# Patient Record
Sex: Female | Born: 1950 | Race: White | Hispanic: No | Marital: Married | Smoking: Never smoker
Health system: Southern US, Community
[De-identification: ages and names within clinical notes are randomized; demographics above are authoritative.]

## PROBLEM LIST (undated history)

## (undated) DIAGNOSIS — E785 Hyperlipidemia, unspecified: Secondary | ICD-10-CM

## (undated) DIAGNOSIS — K2270 Barrett's esophagus without dysplasia: Secondary | ICD-10-CM

## (undated) DIAGNOSIS — C4359 Malignant melanoma of other part of trunk: Secondary | ICD-10-CM

## (undated) DIAGNOSIS — K219 Gastro-esophageal reflux disease without esophagitis: Secondary | ICD-10-CM

## (undated) DIAGNOSIS — N078 Hereditary nephropathy, not elsewhere classified with other morphologic lesions: Secondary | ICD-10-CM

## (undated) DIAGNOSIS — Z86010 Personal history of colonic polyps: Secondary | ICD-10-CM

## (undated) DIAGNOSIS — I4510 Unspecified right bundle-branch block: Secondary | ICD-10-CM

## (undated) DIAGNOSIS — D049 Carcinoma in situ of skin, unspecified: Secondary | ICD-10-CM

## (undated) DIAGNOSIS — Z85820 Personal history of malignant melanoma of skin: Secondary | ICD-10-CM

## (undated) DIAGNOSIS — I10 Essential (primary) hypertension: Secondary | ICD-10-CM

## (undated) DIAGNOSIS — C44519 Basal cell carcinoma of skin of other part of trunk: Secondary | ICD-10-CM

## (undated) DIAGNOSIS — G43909 Migraine, unspecified, not intractable, without status migrainosus: Secondary | ICD-10-CM

## (undated) HISTORY — DX: Hyperlipidemia, unspecified: E78.5

## (undated) HISTORY — DX: Carcinoma in situ of skin, unspecified: D04.9

## (undated) HISTORY — DX: Malignant melanoma of other part of trunk: C43.59

## (undated) HISTORY — DX: Unspecified right bundle-branch block: I45.10

## (undated) HISTORY — DX: Basal cell carcinoma of skin of other part of trunk: C44.519

## (undated) HISTORY — DX: Essential (primary) hypertension: I10

## (undated) HISTORY — DX: Gastro-esophageal reflux disease without esophagitis: K21.9

## (undated) HISTORY — DX: Hereditary nephropathy, not elsewhere classified with other morphologic lesions: N07.8

## (undated) HISTORY — DX: Personal history of colonic polyps: Z86.010

## (undated) HISTORY — DX: Migraine, unspecified, not intractable, without status migrainosus: G43.909

## (undated) HISTORY — DX: Personal history of malignant melanoma of skin: Z85.820

## (undated) HISTORY — DX: Barrett's esophagus without dysplasia: K22.70

---

## 1978-11-02 HISTORY — PX: OTHER SURGICAL HISTORY: SHX169

## 1991-11-03 HISTORY — PX: ABDOMINAL HYSTERECTOMY: SHX81

## 2008-11-02 DIAGNOSIS — K2270 Barrett's esophagus without dysplasia: Secondary | ICD-10-CM

## 2008-11-02 HISTORY — PX: CHOLECYSTECTOMY: SHX55

## 2008-11-02 HISTORY — DX: Barrett's esophagus without dysplasia: K22.70

## 2008-11-14 ENCOUNTER — Encounter: Payer: Self-pay | Admitting: Gastroenterology

## 2008-12-03 DIAGNOSIS — Z86010 Personal history of colonic polyps: Secondary | ICD-10-CM | POA: Insufficient documentation

## 2008-12-03 DIAGNOSIS — Z860101 Personal history of adenomatous and serrated colon polyps: Secondary | ICD-10-CM

## 2008-12-03 HISTORY — PX: COLONOSCOPY W/ BIOPSIES AND POLYPECTOMY: SHX1376

## 2008-12-03 HISTORY — DX: Personal history of adenomatous and serrated colon polyps: Z86.0101

## 2008-12-03 HISTORY — DX: Personal history of colonic polyps: Z86.010

## 2008-12-11 ENCOUNTER — Encounter: Payer: Self-pay | Admitting: Gastroenterology

## 2008-12-27 ENCOUNTER — Encounter: Payer: Self-pay | Admitting: Gastroenterology

## 2009-11-02 DIAGNOSIS — C4359 Malignant melanoma of other part of trunk: Secondary | ICD-10-CM

## 2009-11-02 HISTORY — PX: MELANOMA EXCISION: SHX5266

## 2009-11-02 HISTORY — DX: Malignant melanoma of other part of trunk: C43.59

## 2010-07-23 ENCOUNTER — Encounter (INDEPENDENT_AMBULATORY_CARE_PROVIDER_SITE_OTHER): Payer: Self-pay | Admitting: *Deleted

## 2010-08-14 ENCOUNTER — Encounter: Admission: RE | Admit: 2010-08-14 | Discharge: 2010-08-14 | Payer: Self-pay | Admitting: Surgery

## 2010-09-05 ENCOUNTER — Ambulatory Visit: Payer: Self-pay | Admitting: Gastroenterology

## 2010-09-05 ENCOUNTER — Encounter (INDEPENDENT_AMBULATORY_CARE_PROVIDER_SITE_OTHER): Payer: Self-pay | Admitting: *Deleted

## 2010-09-05 DIAGNOSIS — Z860100 Personal history of colon polyps, unspecified: Secondary | ICD-10-CM

## 2010-09-05 DIAGNOSIS — K2270 Barrett's esophagus without dysplasia: Secondary | ICD-10-CM | POA: Insufficient documentation

## 2010-09-05 HISTORY — DX: Personal history of colonic polyps: Z86.010

## 2010-09-05 HISTORY — DX: Personal history of colon polyps, unspecified: Z86.0100

## 2010-11-04 ENCOUNTER — Ambulatory Visit
Admission: RE | Admit: 2010-11-04 | Discharge: 2010-11-04 | Payer: Self-pay | Source: Home / Self Care | Attending: Gastroenterology | Admitting: Gastroenterology

## 2010-11-04 ENCOUNTER — Encounter: Payer: Self-pay | Admitting: Gastroenterology

## 2010-11-04 DIAGNOSIS — R933 Abnormal findings on diagnostic imaging of other parts of digestive tract: Secondary | ICD-10-CM | POA: Insufficient documentation

## 2010-11-25 ENCOUNTER — Encounter: Payer: Self-pay | Admitting: Gastroenterology

## 2010-11-27 ENCOUNTER — Other Ambulatory Visit: Payer: Self-pay | Admitting: Plastic Surgery

## 2010-12-02 NOTE — Letter (Signed)
Summary: EGD Instructions  [facility]  [place], [place] [address]   Phone: [phone]  Fax: [phone]       Sarah Valentine    1951-10-02    MRN: [id number]       Procedure Day /Date:11/04/10  TUE     Arrival Time:1030 am     Procedure Time:1130 am     Location of Procedure:                    [facility] (4th Floor)   PREPARATION FOR ENDOSCOPY   On 11/04/10 THE DAY OF THE PROCEDURE:  1.   No solid foods, milk or milk products are allowed after midnight the night before your procedure.  2.   Do not drink anything colored red or purple.  Avoid juices with pulp.  No orange juice.  3.  You may drink clear liquids until 930 am , which is 2 hours before your procedure.                                                                                                CLEAR LIQUIDS INCLUDE: Water Jello Ice Popsicles Tea (sugar ok, no milk/cream) Powdered fruit flavored drinks Coffee (sugar ok, no milk/cream) Gatorade Juice: apple, white grape, white cranberry  Lemonade Clear bullion, consomm, broth Carbonated beverages (any kind) Strained chicken noodle soup Hard Candy   MEDICATION INSTRUCTIONS  Unless otherwise instructed, you should take regular prescription medications with a small sip of water as early as possible the morning of your procedure.             OTHER INSTRUCTIONS  You will need a responsible adult at least 60 years of age to accompany you and drive you home.   This person must remain in the waiting room during your procedure.  Wear loose fitting clothing that is easily removed.  Leave jewelry and other valuables at home.  However, you may wish to bring a book to read or an iPod/MP3 player to listen to music as you wait for your procedure to start.  Remove all body piercing jewelry and leave at home.  Total time from sign-in until discharge is approximately 2-3 hours.  You should go home directly after  your procedure and rest.  You can resume normal activities the day after your procedure.  The day of your procedure you should not:   Drive   Make legal decisions   Operate machinery   Drink alcohol   Return to work  You will receive specific instructions about eating, activities and medications before you leave.    The above instructions have been reviewed and explained to me by   _______________________    I fully understand and can verbalize these instructions _____________________________ Date _________

## 2010-12-02 NOTE — Procedures (Signed)
Summary: Colon/[facility]  Colon/[facility]   Imported By: Bubba Hales 09/10/2010 09:18:19  _____________________________________________________________________  External Attachment:    Type:   Image     Comment:   External Document

## 2010-12-02 NOTE — Procedures (Signed)
Summary: EGD/[facility]  EGD/[facility] By: Bubba Hales 09/10/2010 09:22:30  _____________________________________________________________________  External Attachment:    Type:   Image     Comment:   External Document

## 2010-12-02 NOTE — Assessment & Plan Note (Signed)
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  underwent lap chole, for gallstone pancreatitis in 2010.  EGD done and a gastric polyp was removed. this was large but was fundic gland on pathology.  this was in January 2010 she had a repeat EGD a few months later to check the polyp site and at that point A. abnormal GE junction was noted, biopsies showed Barrett's esophagus changes without dysplasia.  Rare, intermittent GERD symptoms.  she had a colonoscopy January 2010 and is found a single small tubular adenoma, she was recommended to have a repeat colonoscopy in 5 years.  Overall stable weight.  No dysphagia (+recent sore throat, horseness).  No overt GI bleeding.           Current Medications (verified): 1)  Benicar 5 Mg Tabs (Olmesartan Medoxomil) .... Two Tablets By Mouth Once Daily 2)  Omeprazole 20 Mg Cpdr (Omeprazole) .... One Tablet By Mouth Once Daily 3)  Cranberry 405 Mg Caps (Cranberry) .... One Capsule By Mouth Once Daily  Allergies (verified): 1)  ! Sulfa  Past History:  Past Medical History: biliary pancreatitis 2010, Laparoscopic cholecystectomy 2010 Barrett's esophagus without dysplasia 2010 Adenomatous colon polyps Intermittent GERD Elevated cholesterol Hypertension Urinary tract infection  Past Surgical History: hysterectomy 1993 Laparoscopic cholecystectomy 2010  Family History: father with esophagus cancer Grandmother with colon cancer Mother with breast cancer  Social History: she is married, she has 3 daughters, she is a Equities trader, she does not smoke cigarettes or drink alcohol.  Review of Systems       Pertinent positive and negative review of systems were noted in the above HPI and GI specific  review of systems.  All other review of systems was otherwise negative.   Vital Signs:  Patient profile:   60 year old female Height:      64 inches Weight:      150 pounds BMI:     25.84 BSA:     1.73 Pulse rate:   88 / minute Pulse rhythm:   regular BP sitting:   132 / 84  (left arm) Cuff size:   regular  Vitals Entered By: Hope Pigeon CMA (September 05, 2010 1:22 PM)  Physical Exam  Additional Exam:  Constitutional: generally well appearing Psychiatric: alert and oriented times 3 Eyes: extraocular movements intact Mouth: oropharynx moist, no lesions Neck: supple, no lymphadenopathy Cardiovascular: heart regular rate and rythm Lungs: CTA bilaterally Abdomen: soft, non-tender, non-distended, no obvious ascites, no peritoneal signs, normal bowel sounds Extremities: no lower extremity edema bilaterally Skin: no lesions on visible extremities    Impression & Recommendations:  Problem # 1:  Adenomatous colon polyps she will be put in our recall system for a repeat colonoscopy at five-year interval.  Problem # 2:  Barrett's esophagus she is due for repeat Barrett's surveillance, we will schedule this EGD to be done at her soonest convenience.  Patient Instructions: 1)  You will be scheduled to have an upper endoscopy for h/o Barrett's and family history of esophagus cancer. 2)  Stay on PPI once daily 3)  Recall colonoscopy in 12/2013 (5 year interval) for history of adenomatous colon polyp. 4)  A copy of this information will be sent to Dr. Passapatanzy Desanctis. 5)  The medication list  was reviewed and reconciled.  All changed / newly prescribed medications were explained.  A complete medication list was provided to the patient / caregiver.  Appended Document: Orders Update/EGD    Clinical Lists Changes  Problems: Added new problem of BARRETTS ESOPHAGUS (ICD-530.85) Added new problem of PERSONAL HISTORY OF COLONIC POLYPS (ICD-V12.72) Orders: Added new Test order of EGD (EGD) -  Signed      Appended Document: RECALL COLON RECALL IN idx AND emr   Clinical Lists Changes  Observations: Added new observation of COLONNXTDUE: 12/2013 (09/05/2010 13:54)

## 2010-12-02 NOTE — Letter (Signed)
Summary: New Patient letter  [facility]  [address] [place], [place] [address]   Phone: [phone]  Fax: [phone]       07/23/2010 MRN: [id number]  [facility] [place], [place]  [address]  Dear Ms. [facility],  Welcome to the Gastroenterology Division at [facility].    You are scheduled to see Dr.  Ardis Hughs on 09-05-10 at 1:30a.m. on the 3rd floor at [facility], [place] [employer].  We ask that you try to arrive at our office 15 minutes prior to your appointment time to allow for check-in.  We would like you to complete the enclosed self-administered evaluation form prior to your visit and bring it with you on the day of your appointment.  We will review it with you.  Also, please bring a complete list of all your medications or, if you prefer, bring the medication bottles and we will list them.  Please bring your insurance card so that we may make a copy of it.  If your insurance requires a referral to see a specialist, please bring your referral form from your primary care physician.  Co-payments are due at the time of your visit and may be paid by cash, check or credit card.     Your office visit will consist of a consult with your physician (includes a physical exam), any laboratory testing he/she may order, scheduling of any necessary diagnostic testing (e.g. x-ray, ultrasound, CT-scan), and scheduling of a procedure (e.g. Endoscopy, Colonoscopy) if required.  Please allow enough time on your schedule to allow for any/all of these possibilities.    If you cannot keep your appointment, please call [phone] to cancel or reschedule prior to your appointment date.  This allows [place] the opportunity to schedule an appointment for another patient in need of care.  If you do not cancel or reschedule by 5 p.m. the business day prior to your appointment date, you will be charged a $50.00 late cancellation/no-show fee.    Thank you for choosing  [facility] for your medical needs.  We appreciate the opportunity to care for you.  Please visit [place] at our website  to learn more about our practice.                     Sincerely,                                                             The Gastroenterology Division

## 2010-12-02 NOTE — Procedures (Signed)
Summary: EGD/[facility]  EGD/[facility]   Imported By: Bubba Hales 09/10/2010 09:26:31  _____________________________________________________________________  External Attachment:    Type:   Image     Comment:   External Document

## 2010-12-04 NOTE — Procedures (Signed)
Summary: Upper Endoscopy  Patient: Sarah Valentine Note: All result statuses are Final unless otherwise noted.  Tests: (1) Upper Endoscopy (EGD)   EGD Upper Endoscopy       [facility]     [place], [place]  [address]           ENDOSCOPY PROCEDURE REPORT     PATIENT:  Sarah, Valentine  MR#:  [id number]     BIRTHDATE:  12/07/1950, 28 yrs. old  GENDER:  female     ENDOSCOPIST:  Milus Banister, MD     REFERRED:  Kennon Holter, MD     PROCEDURE DATE:  11/04/2010     PROCEDURE:  EGD with biopsy, [id number]     ASA CLASS:  Class II     INDICATIONS:  previous EGD 1 year ago in [place] (short segment     Barrett's without dysplasia, also two gastric polyps that were     both fundic gland)     MEDICATIONS:  Fentanyl 50 mcg IV, Versed 7 mg IV     TOPICAL ANESTHETIC:  Exactacain Spray     DESCRIPTION OF PROCEDURE:   After the risks benefits and     alternatives of the procedure were thoroughly explained, informed     consent was obtained.  [facility] [id number] endoscope was     introduced through the mouth and advanced to the second portion of     the duodenum, without limitations.  The instrument was slowly     withdrawn as the mucosa was fully examined.     <<PROCEDUREIMAGES>>     There was a slightly irregular z-line without nodules or obvious     Barrett's changes. Biopsies taken and sent to pathology (jar 1).     The site of previous [place] Ink application appeared normal. There     was a 64mm, fundic appearing polyp in gastric antrum, not biopsied     given previous biopsies (see image2, image7, and image5).  There     was a bluish discoloration in posterior oropharynx, appeared     somewhat vascular. This is of unclear clinical relevence (see     image1).  Otherwise the examination was normal (see image4 and     image6).    Retroflexed views revealed no abnormalities.    The     scope was then withdrawn from the patient and the procedure  completed.     COMPLICATIONS:  None     ENDOSCOPIC IMPRESSION:     1) Irregular Z-line without clear Barrett's changes, biopsied     2) Bluish, somewhat vascular appearing lesion in oropharynx; not     biopsied     3) Site of Niger Ink application appeared normal.  Other polyp in     gastric antrum noted, previously found to be a fundic gland polyps           4) Otherwise normal examination           RECOMMENDATIONS:     Continue on PPI once daily.  Await GE junction biopsies for     final recommendations.     Dr. Ardis Hughs' office will arrange ENT referral to evaluate the     oropharynx bluish area.           ______________________________     Milus Banister, MD           n.  eSIGNED:   Milus Banister at 11/04/2010 09:31 AM           Ezzie Dural, [id number]  Note: An exclamation mark (!) indicates a result that was not dispersed into the flowsheet. Document Creation Date: 11/04/2010 9:32 AM _______________________________________________________________________  (1) Order result status: Final Collection or observation date-time: 11/04/2010 09:20 Requested date-time:  Receipt date-time:  Reported date-time:  Referring Physician:   Ordering Physician: Owens Loffler [id number]) Specimen Source:  Source: Tawanna Cooler Order Number: [id number] Lab site:   Appended Document: Orders Update/ENT    Clinical Lists Changes  Problems: Added new problem of NONSPECIFIC ABN FINDING RAD & OTH EXAM GI TRACT (ICD-793.4) Orders: Added new Test order of [facility] ([facility]) - Signed

## 2010-12-10 NOTE — Consult Note (Signed)
Summary: Jerrell Belfast MD/[facility]  Jerrell Belfast MD/ ENT   Imported By: Bubba Hales 12/05/2010 11:07:26  _____________________________________________________________________  External Attachment:    Type:   Image     Comment:   External Document

## 2011-08-11 ENCOUNTER — Other Ambulatory Visit: Payer: Self-pay | Admitting: Dermatology

## 2011-09-28 ENCOUNTER — Ambulatory Visit (INDEPENDENT_AMBULATORY_CARE_PROVIDER_SITE_OTHER): Payer: BC Managed Care – PPO | Admitting: Internal Medicine

## 2011-09-28 ENCOUNTER — Encounter: Payer: Self-pay | Admitting: Internal Medicine

## 2011-09-28 VITALS — BP 102/70 | HR 78 | Temp 98.4°F | Ht 63.5 in | Wt 147.1 lb

## 2011-09-28 DIAGNOSIS — E785 Hyperlipidemia, unspecified: Secondary | ICD-10-CM | POA: Insufficient documentation

## 2011-09-28 DIAGNOSIS — C4359 Malignant melanoma of other part of trunk: Secondary | ICD-10-CM | POA: Insufficient documentation

## 2011-09-28 DIAGNOSIS — Z13820 Encounter for screening for osteoporosis: Secondary | ICD-10-CM

## 2011-09-28 DIAGNOSIS — I10 Essential (primary) hypertension: Secondary | ICD-10-CM

## 2011-09-28 DIAGNOSIS — N059 Unspecified nephritic syndrome with unspecified morphologic changes: Secondary | ICD-10-CM

## 2011-09-28 DIAGNOSIS — G43909 Migraine, unspecified, not intractable, without status migrainosus: Secondary | ICD-10-CM | POA: Insufficient documentation

## 2011-09-28 DIAGNOSIS — K219 Gastro-esophageal reflux disease without esophagitis: Secondary | ICD-10-CM

## 2011-09-28 DIAGNOSIS — Z0000 Encounter for general adult medical examination without abnormal findings: Secondary | ICD-10-CM

## 2011-09-28 DIAGNOSIS — Z85820 Personal history of malignant melanoma of skin: Secondary | ICD-10-CM | POA: Insufficient documentation

## 2011-09-28 DIAGNOSIS — Z23 Encounter for immunization: Secondary | ICD-10-CM

## 2011-09-28 DIAGNOSIS — Z1211 Encounter for screening for malignant neoplasm of colon: Secondary | ICD-10-CM

## 2011-09-28 DIAGNOSIS — Z1239 Encounter for other screening for malignant neoplasm of breast: Secondary | ICD-10-CM

## 2011-09-28 NOTE — Assessment & Plan Note (Signed)
On therapy with good control for many years Family history of nephritis reviewed - will check associated labs and urine microbial and/creatinine ratio now The current medical regimen is effective;  continue present plan and medications.  BP Readings from Last 3 Encounters:  09/28/11 102/70  09/05/10 132/84

## 2011-09-28 NOTE — Progress Notes (Signed)
Subjective:    Patient ID: Sarah Valentine, female    DOB: 1950/12/11, 60 y.o.   MRN: [id number]  HPI New patient to me and our division, here today to establish care Also patient is here today for annual physical. Patient feels well and has no complaints.  Also reviewed chronic medical issues  Hypertension. On ARB with good control - the patient reports compliance with medication(s) as prescribed. Denies adverse side effects. Family history of nephritis on maternal side, terminal for men with the associated disease. No personal history of renal problems  Melanoma history 2011. Excision from left buttock, in situ. Follows regularly with dermatology for maintenance and surveillance of same  GERD, history of Barrett's. Family history of esophageal cancer noted. Follows with GI for same. Never with dyspepsia symptoms - currently proton pump used daily with near 100% compliance. No dysphasia or dyspepsia.  Past Medical History  Diagnosis Date  . Short Pump 2010  . Hypertension   . Hx of adenomatous colonic polyps 12/2008  . Hyperlipidemia   . 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 GERD (gastroesophageal reflux disease)   . Migraines    Family History  Problem Relation Age of Onset  . Breast cancer Mother   . Arthritis Mother   . Hyperlipidemia Mother   . Heart disease Mother   . Hypertension Mother   . Kidney disease Mother   . Diabetes Mother   . Cancer Father     Esophagus  . Arthritis Father   . Colon cancer Maternal Grandmother   . Arthritis Maternal Grandmother   . Hypertension Maternal Grandmother   . Kidney disease Maternal Grandmother   . Drug abuse Maternal Grandmother   . Breast cancer Maternal Aunt   . Arthritis Other   . Breast cancer Other     Pt states all female family members has had breast cancer  . Kidney disease Other     pt states mostly maternal side of family as the disease   History  Substance Use Topics  .  Smoking status: Never Smoker   . Smokeless tobacco: Not on file   Comment: married, 3 daughters- Therapist, sports  . Alcohol Use: No    Review of Systems Constitutional: Negative for fever or weight change.  Respiratory: Negative for cough and shortness of breath.   Cardiovascular: Negative for chest pain or palpitations.  Gastrointestinal: Negative for abdominal pain, no bowel changes.  Musculoskeletal: Negative for gait problem or joint swelling.  Skin: Negative for rash.  Neurological: Negative for dizziness or headache.  No other specific complaints in a complete review of systems (except as listed in HPI above).     Objective:   Physical Exam BP 102/70  Pulse 78  Temp(Src) 98.4 F (36.9 C) (Oral)  Ht 5' 3.5" (1.613 m)  Wt 147 lb 1.9 oz (66.733 kg)  BMI 25.65 kg/m2  SpO2 97% Wt Readings from Last 3 Encounters:  09/28/11 147 lb 1.9 oz (66.733 kg)  09/05/10 150 lb (68.04 kg)   Constitutional: She appears well-developed and well-nourished. No distress.  HENT: Head: Normocephalic and atraumatic. Ears: B TMs ok, no erythema or effusion; Nose: Nose normal.  Mouth/Throat: Oropharynx is clear and moist. No oropharyngeal exudate.  Eyes: Conjunctivae and EOM are normal. Pupils are equal, round, and reactive to light. No scleral icterus.  Neck: Normal range of motion. Neck supple. No JVD present. No thyromegaly present.  Cardiovascular: Normal rate, regular rhythm and normal  heart sounds.  No murmur heard. No BLE edema. Pulmonary/Chest: Effort normal and breath sounds normal. No respiratory distress. She has no wheezes.  Abdominal: Soft. Bowel sounds are normal. She exhibits no distension. There is no tenderness. no masses Musculoskeletal: Normal range of motion, no joint effusions. No gross deformities Neurological: She is alert and oriented to person, place, and time. No cranial nerve deficit. Coordination normal.  Skin: Skin is warm and dry. No rash noted. No erythema.  Psychiatric: She has a  normal mood and affect. Her behavior is normal. Judgment and thought content normal.   No results found for this basename: WBC, HGB, HCT, PLT, GLUCOSE, CHOL, TRIG, HDL, LDLDIRECT, LDLCALC, ALT, AST, NA, K, CL, CREATININE, BUN, CO2, TSH, PSA, INR, GLUF, HGBA1C, MICROALBUR       Assessment & Plan:  CPX - v70.0 - Patient has been counseled on age-appropriate routine health concerns for screening and prevention. These are reviewed and up-to-date. Immunizations are up-to-date or declined. Labs ordered and will be reviewed. order mammo and dexa, PAP not indicated  Screening for breast cancer - strong FH same - 2 sisters with dz ages 23 and 30, also maternal side - has considered prophylactic B mastectomy and discussed with streck and plastics for same in 2011, but coverage for prophylactic surgery denied by insurance - has instead opted for semiannual MRI to survey for same, last done 04/2010. BRAC genetic testing negative - has no abnormalities on MRI, we'll resume annual mammogram at this time and patient to continue home breast exams as ongoing  Family history nephritis - personal history of hypertension - check urine for microalbumin/creatinine ratio now

## 2011-09-28 NOTE — Patient Instructions (Signed)
It was good to see you today. We have reviewed your medical history and available test results today Health Maintenance reviewed - mammogram, bone density ordered today - no need for Pap, immunizations and colon screening up-to-date.  Test(s) ordered today including microalbumin and creatinine ratio for nephritis screening. Your results will be called to you after review (48-72hours after test completion). If any changes need to be made, you will be notified at that time. Take home fecal occult blood screening provided for you today Medications reviewed, no changes made. Please let us know when refills are needed we'll make referral to [facility] for mammography and bone density scanning. Our office will contact you regarding appointment(s) once made. Please schedule followup annually for medical physical and labs, call sooner if problems.

## 2011-09-28 NOTE — Assessment & Plan Note (Signed)
Never with reflux symptoms but positive Barrett's changes on EGD Follows with GI for same -continue surveillance as planned Continue proton pump inhibitor daily

## 2011-09-29 ENCOUNTER — Telehealth: Payer: Self-pay

## 2011-09-29 NOTE — Telephone Encounter (Signed)
Breast Center called stating that all diagnostic codes attached to [facility] and DXA need to be removed except for screening for osteoporosis and other screening breast examination.

## 2011-09-29 NOTE — Progress Notes (Signed)
Addended by: Gwendolyn Grant A on: 09/29/2011 03:41 PM   Modules accepted: Orders

## 2011-09-29 NOTE — Telephone Encounter (Signed)
Ok done

## 2011-10-01 ENCOUNTER — Other Ambulatory Visit: Payer: Self-pay

## 2011-10-01 DIAGNOSIS — Z780 Asymptomatic menopausal state: Secondary | ICD-10-CM

## 2011-10-02 ENCOUNTER — Other Ambulatory Visit (INDEPENDENT_AMBULATORY_CARE_PROVIDER_SITE_OTHER): Payer: BC Managed Care – PPO

## 2011-10-02 ENCOUNTER — Other Ambulatory Visit: Payer: Self-pay | Admitting: Internal Medicine

## 2011-10-02 DIAGNOSIS — Z13820 Encounter for screening for osteoporosis: Secondary | ICD-10-CM

## 2011-10-02 DIAGNOSIS — I10 Essential (primary) hypertension: Secondary | ICD-10-CM

## 2011-10-02 DIAGNOSIS — Z0000 Encounter for general adult medical examination without abnormal findings: Secondary | ICD-10-CM

## 2011-10-02 DIAGNOSIS — Z1239 Encounter for other screening for malignant neoplasm of breast: Secondary | ICD-10-CM

## 2011-10-02 DIAGNOSIS — K219 Gastro-esophageal reflux disease without esophagitis: Secondary | ICD-10-CM

## 2011-10-02 DIAGNOSIS — Z23 Encounter for immunization: Secondary | ICD-10-CM

## 2011-10-02 DIAGNOSIS — N059 Unspecified nephritic syndrome with unspecified morphologic changes: Secondary | ICD-10-CM

## 2011-10-02 LAB — URINALYSIS, ROUTINE W REFLEX MICROSCOPIC
Specific Gravity, Urine: >=1.03 (ref 1.000–1.030)
Total Protein, Urine: 100
Urine Glucose: NEGATIVE
Urobilinogen, UA: 0.2 (ref 0.0–1.0)

## 2011-10-02 LAB — LIPID PANEL
Cholesterol: 214 mg/dL — ABNORMAL HIGH (ref 0–200)
HDL: 55.4 mg/dL (ref >39.00)
Total CHOL/HDL Ratio: 4
VLDL: 20.6 mg/dL (ref 0.0–40.0)

## 2011-10-02 LAB — CBC WITH DIFFERENTIAL/PLATELET
Basophils Relative: 0.4 % (ref 0.0–3.0)
Eosinophils Relative: 3.9 % (ref 0.0–5.0)
HCT: 37.7 % (ref 36.0–46.0)
Hemoglobin: 12.5 g/dL (ref 12.0–15.0)
Lymphs Abs: 1.9 10*3/uL (ref 0.7–4.0)
MCV: 84.7 fl (ref 78.0–100.0)
Monocytes Absolute: 0.6 10*3/uL (ref 0.1–1.0)
Neutro Abs: 4.7 10*3/uL (ref 1.4–7.7)
RBC: 4.45 Mil/uL (ref 3.87–5.11)
WBC: 7.5 10*3/uL (ref 4.5–10.5)

## 2011-10-02 LAB — HEPATIC FUNCTION PANEL
Albumin: 3.6 g/dL (ref 3.5–5.2)
Total Protein: 7 g/dL (ref 6.0–8.3)

## 2011-10-02 LAB — BASIC METABOLIC PANEL
Chloride: 108 mEq/L (ref 96–112)
Potassium: 4.4 mEq/L (ref 3.5–5.1)

## 2011-10-02 LAB — TSH: TSH: 1.19 u[IU]/mL (ref 0.35–5.50)

## 2011-10-05 LAB — MICROALBUMIN / CREATININE URINE RATIO: Creatinine,U: 114.4 mg/dL

## 2011-10-08 ENCOUNTER — Other Ambulatory Visit: Payer: Self-pay | Admitting: Internal Medicine

## 2011-10-08 ENCOUNTER — Other Ambulatory Visit: Payer: BC Managed Care – PPO

## 2011-10-08 DIAGNOSIS — Z1211 Encounter for screening for malignant neoplasm of colon: Secondary | ICD-10-CM

## 2011-10-08 LAB — HEMOCCULT SLIDES (X 3 CARDS)
Fecal Occult Blood: NEGATIVE
OCCULT 1: NEGATIVE
OCCULT 5: NEGATIVE

## 2011-10-15 ENCOUNTER — Telehealth: Payer: Self-pay | Admitting: *Deleted

## 2011-10-15 NOTE — Telephone Encounter (Signed)
Notified pt MD received copy of last pt drop off wanted to inform pt that she agreed with increasing the water intake, and it was ok to repeat urine/microalbumin again in 3 months. Pt to call bck in 3 months to have done...10/15/11@2 :50pm/LMB

## 2011-10-23 ENCOUNTER — Ambulatory Visit
Admission: RE | Admit: 2011-10-23 | Discharge: 2011-10-23 | Disposition: A | Discharge status: Home / Self Care | Payer: BC Managed Care – PPO | Source: Ambulatory Visit | Attending: Internal Medicine | Admitting: Internal Medicine

## 2011-10-23 DIAGNOSIS — Z780 Asymptomatic menopausal state: Secondary | ICD-10-CM

## 2011-10-23 DIAGNOSIS — Z1239 Encounter for other screening for malignant neoplasm of breast: Secondary | ICD-10-CM

## 2011-11-10 ENCOUNTER — Encounter: Payer: Self-pay | Admitting: Internal Medicine

## 2012-01-01 ENCOUNTER — Other Ambulatory Visit: Payer: Self-pay | Admitting: Dermatology

## 2012-07-19 ENCOUNTER — Other Ambulatory Visit: Payer: Self-pay | Admitting: Dermatology

## 2012-10-04 ENCOUNTER — Other Ambulatory Visit: Payer: Self-pay | Admitting: Gastroenterology

## 2012-10-04 DIAGNOSIS — Z23 Encounter for immunization: Secondary | ICD-10-CM

## 2012-10-04 DIAGNOSIS — Z0000 Encounter for general adult medical examination without abnormal findings: Secondary | ICD-10-CM

## 2012-10-04 DIAGNOSIS — N059 Unspecified nephritic syndrome with unspecified morphologic changes: Secondary | ICD-10-CM

## 2012-10-04 DIAGNOSIS — I10 Essential (primary) hypertension: Secondary | ICD-10-CM

## 2012-10-04 DIAGNOSIS — Z1239 Encounter for other screening for malignant neoplasm of breast: Secondary | ICD-10-CM

## 2012-10-04 DIAGNOSIS — K219 Gastro-esophageal reflux disease without esophagitis: Secondary | ICD-10-CM

## 2012-10-04 DIAGNOSIS — Z13820 Encounter for screening for osteoporosis: Secondary | ICD-10-CM

## 2012-10-04 MED ORDER — OMEPRAZOLE 40 MG PO CPDR: 40.0000 mg | DELAYED_RELEASE_CAPSULE | Freq: Every day | ORAL | Status: DC

## 2012-10-04 NOTE — Telephone Encounter (Signed)
Pt med sent she is aware

## 2012-10-12 ENCOUNTER — Ambulatory Visit: Payer: BC Managed Care – PPO | Admitting: Internal Medicine

## 2012-10-21 ENCOUNTER — Telehealth: Payer: Self-pay | Admitting: *Deleted

## 2012-10-21 ENCOUNTER — Ambulatory Visit (INDEPENDENT_AMBULATORY_CARE_PROVIDER_SITE_OTHER): Payer: BC Managed Care – PPO | Admitting: Internal Medicine

## 2012-10-21 ENCOUNTER — Encounter: Payer: Self-pay | Admitting: Internal Medicine

## 2012-10-21 VITALS — BP 110/70 | HR 77 | Temp 98.2°F | Ht 63.5 in | Wt 149.1 lb

## 2012-10-21 DIAGNOSIS — Z1239 Encounter for other screening for malignant neoplasm of breast: Secondary | ICD-10-CM

## 2012-10-21 DIAGNOSIS — Z1211 Encounter for screening for malignant neoplasm of colon: Secondary | ICD-10-CM

## 2012-10-21 DIAGNOSIS — Z23 Encounter for immunization: Secondary | ICD-10-CM

## 2012-10-21 DIAGNOSIS — Z803 Family history of malignant neoplasm of breast: Secondary | ICD-10-CM

## 2012-10-21 DIAGNOSIS — Z0000 Encounter for general adult medical examination without abnormal findings: Secondary | ICD-10-CM

## 2012-10-21 DIAGNOSIS — Z2911 Encounter for prophylactic immunotherapy for respiratory syncytial virus (RSV): Secondary | ICD-10-CM

## 2012-10-21 DIAGNOSIS — N059 Unspecified nephritic syndrome with unspecified morphologic changes: Secondary | ICD-10-CM

## 2012-10-21 DIAGNOSIS — I10 Essential (primary) hypertension: Secondary | ICD-10-CM

## 2012-10-21 MED ORDER — OLMESARTAN MEDOXOMIL 20 MG PO TABS: 10.0000 mg | ORAL_TABLET | Freq: Every day | ORAL | Status: DC

## 2012-10-21 NOTE — Assessment & Plan Note (Signed)
On therapy with good control for many years Family history of nephritis reviewed - will check associated labs and urine microbial and/creatinine ratio now The current medical regimen is effective;  continue present plan and medications.  BP Readings from Last 3 Encounters:  10/21/12 110/70  09/28/11 102/70  09/05/10 132/84

## 2012-10-21 NOTE — Patient Instructions (Signed)
It was good to see you today. We have reviewed your medical history and available test results today Health Maintenance reviewed - flu and Shingles vaccine given today - mammogram, breast MRI ordered today - no need for Pap; immunizations and colon screening up-to-date.  Test(s) ordered today including microalbumin and creatinine ratio for nephritis screening. Test(s) ordered today. Your results will be released to [place] (or called to you) after review, usually within 72hours after test completion. If any changes need to be made, you will be notified at that same time. Take home fecal occult blood screening provided for you today Medications reviewed and updated - Refill on medication(s) as discussed today. Please schedule followup annually for medical physical and labs, call sooner if problems.  Health Maintenance, Females A healthy lifestyle and preventative care can promote health and wellness.  Maintain regular health, dental, and eye exams.   Eat a healthy diet. Foods like vegetables, fruits, whole grains, low-fat dairy products, and lean protein foods contain the nutrients you need without too many calories. Decrease your intake of foods high in solid fats, added sugars, and salt. Get information about a proper diet from your caregiver, if necessary.   Regular physical exercise is one of the most important things you can do for your health. Most adults should get at least 150 minutes of moderate-intensity exercise (any activity that increases your heart rate and causes you to sweat) each week. In addition, most adults need muscle-strengthening exercises on 2 or more days a week.     Maintain a healthy weight. The body mass index (BMI) is a screening tool to identify possible weight problems. It provides an estimate of body fat based on height and weight. Your caregiver can help determine your BMI, and can help you achieve or maintain a healthy weight. For adults 20 years and older:   A BMI  below 18.5 is considered underweight.   A BMI of 18.5 to 24.9 is normal.   A BMI of 25 to 29.9 is considered overweight.   A BMI of 30 and above is considered obese.   Maintain normal blood lipids and cholesterol by exercising and minimizing your intake of saturated fat. Eat a balanced diet with plenty of fruits and vegetables. Blood tests for lipids and cholesterol should begin at age 53 and be repeated every 5 years. If your lipid or cholesterol levels are high, you are over 50, or you are a high risk for heart disease, you may need your cholesterol levels checked more frequently. Ongoing high lipid and cholesterol levels should be treated with medicines if diet and exercise are not effective.   If you smoke, find out from your caregiver how to quit. If you do not use tobacco, do not start.   If you are pregnant, do not drink alcohol. If you are breastfeeding, be very cautious about drinking alcohol. If you are not pregnant and choose to drink alcohol, do not exceed 1 drink per day. One drink is considered to be 12 ounces (355 mL) of beer, 5 ounces (148 mL) of wine, or 1.5 ounces (44 mL) of liquor.   Avoid use of street drugs. Do not share needles with anyone. Ask for help if you need support or instructions about stopping the use of drugs.   High blood pressure causes heart disease and increases the risk of stroke. Blood pressure should be checked at least every 1 to 2 years. Ongoing high blood pressure should be treated with medicines, if weight loss  and exercise are not effective.   If you are 59 to 60 years old, ask your caregiver if you should take aspirin to prevent strokes.   Diabetes screening involves taking a blood sample to check your fasting blood sugar level. This should be done once every 3 years, after age 97, if you are within normal weight and without risk factors for diabetes. Testing should be considered at a younger age or be carried out more frequently if you are overweight  and have at least 1 risk factor for diabetes.   Breast cancer screening is essential preventative care for women. You should practice "breast self-awareness." This means understanding the normal appearance and feel of your breasts and may include breast self-examination. Any changes detected, no matter how small, should be reported to a caregiver. Women in their 58s and 30s should have a clinical breast exam (CBE) by a caregiver as part of a regular health exam every 1 to 3 years. After age 46, women should have a CBE every year. Starting at age 9, women should consider having a mammogram (breast X-ray) every year. Women who have a family history of breast cancer should talk to their caregiver about genetic screening. Women at a high risk of breast cancer should talk to their caregiver about having an MRI and a mammogram every year.   The Pap test is a screening test for cervical cancer. Women should have a Pap test starting at age 33. Between ages 26 and 52, Pap tests should be repeated every 2 years. Beginning at age 51, you should have a Pap test every 3 years as long as the past 3 Pap tests have been normal. If you had a hysterectomy for a problem that was not cancer or a condition that could lead to cancer, then you no longer need Pap tests. If you are between ages 4 and 90, and you have had normal Pap tests going back 10 years, you no longer need Pap tests. If you have had past treatment for cervical cancer or a condition that could lead to cancer, you need Pap tests and screening for cancer for at least 20 years after your treatment. If Pap tests have been discontinued, risk factors (such as a new sexual partner) need to be reassessed to determine if screening should be resumed. Some women have medical problems that increase the chance of getting cervical cancer. In these cases, your caregiver may recommend more frequent screening and Pap tests.   The human papillomavirus (HPV) test is an additional  test that may be used for cervical cancer screening. The HPV test looks for the virus that can cause the cell changes on the cervix. The cells collected during the Pap test can be tested for HPV. The HPV test could be used to screen women aged 65 years and older, and should be used in women of any age who have unclear Pap test results. After the age of 25, women should have HPV testing at the same frequency as a Pap test.   Colorectal cancer can be detected and often prevented. Most routine colorectal cancer screening begins at the age of 31 and continues through age 74. However, your caregiver may recommend screening at an earlier age if you have risk factors for colon cancer. On a yearly basis, your caregiver may provide home test kits to check for hidden blood in the stool. Use of a small camera at the end of a tube, to directly examine the colon (sigmoidoscopy or  colonoscopy), can detect the earliest forms of colorectal cancer. Talk to your caregiver about this at age 31, when routine screening begins. Direct examination of the colon should be repeated every 5 to 10 years through age 76, unless early forms of pre-cancerous polyps or small growths are found.   Hepatitis C blood testing is recommended for all people born from [birth year] through 1964 and any individual with known risks for hepatitis C.   Practice safe sex. Use condoms and avoid high-risk sexual practices to reduce the spread of sexually transmitted infections (STIs). Sexually active women aged 64 and younger should be checked for Chlamydia, which is a common sexually transmitted infection. Older women with new or multiple partners should also be tested for Chlamydia. Testing for other STIs is recommended if you are sexually active and at increased risk.   Osteoporosis is a disease in which the bones lose minerals and strength with aging. This can result in serious bone fractures. The risk of osteoporosis can be identified using a bone density  scan. Women ages 16 and over and women at risk for fractures or osteoporosis should discuss screening with their caregivers. Ask your caregiver whether you should be taking a calcium supplement or vitamin D to reduce the rate of osteoporosis.   Menopause can be associated with physical symptoms and risks. Hormone replacement therapy is available to decrease symptoms and risks. You should talk to your caregiver about whether hormone replacement therapy is right for you.   Use sunscreen with a sun protection factor (SPF) of 30 or greater. Apply sunscreen liberally and repeatedly throughout the day. You should seek shade when your shadow is shorter than you. Protect yourself by wearing long sleeves, pants, a wide-brimmed hat, and sunglasses year round, whenever you are outdoors.   Notify your caregiver of new moles or changes in moles, especially if there is a change in shape or color. Also notify your caregiver if a mole is larger than the size of a pencil eraser.   Stay current with your immunizations.  Document Released: 05/04/2011 Document Revised: 01/11/2012 Document Reviewed: 05/04/2011 [facility] Patient Information 2013 [place].

## 2012-10-21 NOTE — Telephone Encounter (Signed)
Form done 

## 2012-10-21 NOTE — Telephone Encounter (Signed)
Received fax pt needing PA on her Benicar. Called insurance spoke with Rudy/rep faxing over PA form. Case ID # [id number]. Have received place on md for completion...Johny Chess

## 2012-10-21 NOTE — Progress Notes (Signed)
Subjective:    Patient ID: Sarah Valentine, female    DOB: 26-Jul-1951, 61 y.o.   MRN: [id number]  HPI  patient is here today for annual physical. Patient feels well and has no complaints.  Also reviewed chronic medical issues  Hypertension. On ARB with good control - the patient reports compliance with medication(s) as prescribed. Denies adverse side effects. Family history of nephritis on maternal side, terminal for men with the associated disease. No personal history of renal problems  Melanoma history 2011. Excision from left buttock, in situ. Follows regularly with dermatology for maintenance and surveillance of same  GERD, history of Barrett's. Family history of esophageal cancer noted. Follows with GI for same. Never with dyspepsia symptoms - currently proton pump used daily with near 100% compliance. No dysphasia or dyspepsia.  Past Medical History  Diagnosis Date  . [place] 2010  . Hypertension   . Hx of adenomatous colonic polyps 12/2008  . Hyperlipidemia   . 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 GERD (gastroesophageal reflux disease)   . Migraines    Family History  Problem Relation Age of Onset  . Breast cancer Mother 50  . Arthritis Mother   . Hyperlipidemia Mother   . Heart disease Mother   . Hypertension Mother   . Kidney disease Mother   . Diabetes Mother   . Cancer Father     Esophagus  . Arthritis Father   . Colon cancer Maternal Grandmother   . Arthritis Maternal Grandmother   . Hypertension Maternal Grandmother   . Kidney disease Maternal Grandmother   . Drug abuse Maternal Grandmother   . Breast cancer Maternal Aunt   . Arthritis Other   . Breast cancer Sister 72    Pt states all female family members has had breast cancer  . Kidney disease Other     [place] nephritis -pt states mostly maternal side of family as the disease   History  Substance Use Topics  . Smoking status: Never Smoker   . 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 Alcohol Use: No    Review of Systems  Constitutional: Negative for fever or weight change.  Respiratory: Negative for cough and shortness of breath.   Cardiovascular: Negative for chest pain or palpitations.  Gastrointestinal: Negative for abdominal pain, no bowel changes.  Musculoskeletal: Negative for gait problem or joint swelling.  Skin: Negative for rash.  Neurological: Negative for dizziness or headache.  No other specific complaints in a complete review of systems (except as listed in HPI above).     Objective:   Physical Exam  BP 110/70  Pulse 77  Temp 98.2 F (36.8 C) (Oral)  Ht 5' 3.5" (1.613 m)  Wt 149 lb 1.9 oz (67.64 kg)  BMI 26.00 kg/m2  SpO2 96% Wt Readings from Last 3 Encounters:  10/21/12 149 lb 1.9 oz (67.64 kg)  09/28/11 147 lb 1.9 oz (66.733 kg)  09/05/10 150 lb (68.04 kg)   Constitutional: She appears well-developed and well-nourished. No distress.  HENT: Head: Normocephalic and atraumatic. Ears: B TMs ok, no erythema or effusion; Nose: Nose normal.  Mouth/Throat: Oropharynx is clear and moist. No oropharyngeal exudate.  Eyes: Conjunctivae and EOM are normal. Pupils are equal, round, and reactive to light. No scleral icterus.  Neck: Normal range of motion. Neck supple. No JVD  present. No thyromegaly present.  Cardiovascular: Normal rate, regular rhythm and normal heart sounds.  No murmur heard. No BLE edema. Pulmonary/Chest: Effort normal and breath sounds normal. No respiratory distress. She has no wheezes.  Abdominal: Soft. Bowel sounds are normal. She exhibits no distension. There is no tenderness. no masses Musculoskeletal: Normal range of motion, no joint effusions. No gross deformities Neurological: She is alert and oriented to person, place, and time. No cranial nerve deficit. Coordination normal.  Skin: Skin is warm and dry. No  rash noted. No erythema.  Psychiatric: She has a normal mood and affect. Her behavior is normal. Judgment and thought content normal.   Lab Results  Component Value Date   WBC 7.5 10/02/2011   HGB 12.5 10/02/2011   HCT 37.7 10/02/2011   PLT 264.0 10/02/2011   GLUCOSE 86 10/02/2011   CHOL 214* 10/02/2011   TRIG 103.0 10/02/2011   HDL 55.40 10/02/2011   LDLDIRECT 135.6 10/02/2011   ALT 25 10/02/2011   AST 25 10/02/2011   NA 143 10/02/2011   K 4.4 10/02/2011   CL 108 10/02/2011   CREATININE 1.2 10/02/2011   BUN 23 10/02/2011   CO2 28 10/02/2011   TSH 1.19 10/02/2011   MICROALBUR 142.9 Repeated and verified X2.* 10/02/2011      Assessment & Plan:  CPX - v70.0 - Patient has been counseled on age-appropriate routine health concerns for screening and prevention. These are reviewed and up-to-date. Immunizations are up-to-date or declined. Labs ordered and will be reviewed. PAP not indicated  Screening for breast cancer - strong FH same - 2 sisters with dz ages 80 and 2, also maternal side - has considered prophylactic B mastectomy and discussed with streck and plastics for same in 2011, but coverage for prophylactic surgery denied by insurance - has instead opted for semiannual MRI to survey for same, last done 04/2010. BRAC genetic testing negative -   Family history nephritis - personal history of hypertension, prior evaluation at [facility] - check urine for microalbumin/creatinine ratio now, refer to [facility] if increasing trend from last year

## 2012-10-21 NOTE — Telephone Encounter (Signed)
Pt call and wanted to let md know when she was in [place] her md had her on several different BP meds. Couldn't take any because they bottom her out. Always had migraines, but once they started her on Benicar med help her BP & also with her migraines....Johny Chess

## 2012-10-24 NOTE — Telephone Encounter (Signed)
Faxed Pa back to insurance waiting on response...Sarah Valentine

## 2012-10-28 NOTE — Telephone Encounter (Signed)
Received fax med has been approve starting 10/03/12-10/24/13...Johny Chess

## 2012-11-21 ENCOUNTER — Ambulatory Visit
Admission: RE | Admit: 2012-11-21 | Discharge: 2012-11-21 | Disposition: A | Discharge status: Home / Self Care | Payer: BC Managed Care – PPO | Source: Ambulatory Visit | Attending: Internal Medicine | Admitting: Internal Medicine

## 2012-11-21 ENCOUNTER — Ambulatory Visit: Payer: BC Managed Care – PPO

## 2012-11-21 DIAGNOSIS — Z803 Family history of malignant neoplasm of breast: Secondary | ICD-10-CM

## 2012-11-21 DIAGNOSIS — Z1239 Encounter for other screening for malignant neoplasm of breast: Secondary | ICD-10-CM

## 2012-11-24 ENCOUNTER — Other Ambulatory Visit: Payer: BC Managed Care – PPO

## 2012-12-06 ENCOUNTER — Ambulatory Visit
Admission: RE | Admit: 2012-12-06 | Discharge: 2012-12-06 | Disposition: A | Discharge status: Home / Self Care | Payer: BC Managed Care – PPO | Source: Ambulatory Visit | Attending: Internal Medicine | Admitting: Internal Medicine

## 2012-12-06 DIAGNOSIS — Z803 Family history of malignant neoplasm of breast: Secondary | ICD-10-CM

## 2012-12-06 DIAGNOSIS — Z1239 Encounter for other screening for malignant neoplasm of breast: Secondary | ICD-10-CM

## 2012-12-06 MED ORDER — GADOBENATE DIMEGLUMINE 529 MG/ML IV SOLN
13.0000 mL | Freq: Once | INTRAVENOUS | Status: AC | PRN
  Administered 2012-12-06: 13 mL via INTRAVENOUS

## 2012-12-08 ENCOUNTER — Other Ambulatory Visit (INDEPENDENT_AMBULATORY_CARE_PROVIDER_SITE_OTHER): Payer: BC Managed Care – PPO

## 2012-12-08 DIAGNOSIS — N059 Unspecified nephritic syndrome with unspecified morphologic changes: Secondary | ICD-10-CM

## 2012-12-08 DIAGNOSIS — Z0000 Encounter for general adult medical examination without abnormal findings: Secondary | ICD-10-CM

## 2012-12-08 LAB — URINALYSIS, ROUTINE W REFLEX MICROSCOPIC
Bilirubin Urine: NEGATIVE
Nitrite: POSITIVE
Specific Gravity, Urine: 1.02 (ref 1.000–1.030)
Urobilinogen, UA: 0.2 (ref 0.0–1.0)
pH: 7 (ref 5.0–8.0)

## 2012-12-08 LAB — BASIC METABOLIC PANEL
BUN: 19 mg/dL (ref 6–23)
Calcium: 9.1 mg/dL (ref 8.4–10.5)
Creatinine, Ser: 1.2 mg/dL (ref 0.4–1.2)
GFR: 50.09 mL/min — ABNORMAL LOW (ref >60.00)
Glucose, Bld: 105 mg/dL — ABNORMAL HIGH (ref 70–99)
Sodium: 141 mEq/L (ref 135–145)

## 2012-12-08 LAB — MICROALBUMIN / CREATININE URINE RATIO: Creatinine,U: 87.5 mg/dL

## 2012-12-08 LAB — HEPATIC FUNCTION PANEL
AST: 24 U/L (ref 0–37)
Albumin: 3.4 g/dL — ABNORMAL LOW (ref 3.5–5.2)
Total Bilirubin: 0.5 mg/dL (ref 0.3–1.2)

## 2012-12-08 LAB — LIPID PANEL
Total CHOL/HDL Ratio: 4
Triglycerides: 188 mg/dL — ABNORMAL HIGH (ref 0.0–149.0)
VLDL: 37.6 mg/dL (ref 0.0–40.0)

## 2012-12-08 LAB — CBC WITH DIFFERENTIAL/PLATELET
Basophils Absolute: 0 10*3/uL (ref 0.0–0.1)
Lymphocytes Relative: 21.2 % (ref 12.0–46.0)
Monocytes Relative: 8.1 % (ref 3.0–12.0)
Platelets: 270 10*3/uL (ref 150.0–400.0)
RDW: 14.9 % — ABNORMAL HIGH (ref 11.5–14.6)

## 2012-12-09 ENCOUNTER — Encounter: Payer: Self-pay | Admitting: Internal Medicine

## 2012-12-09 DIAGNOSIS — N059 Unspecified nephritic syndrome with unspecified morphologic changes: Secondary | ICD-10-CM

## 2012-12-09 DIAGNOSIS — N390 Urinary tract infection, site not specified: Secondary | ICD-10-CM

## 2012-12-09 MED ORDER — AMPICILLIN 500 MG PO CAPS: 500.0000 mg | ORAL_CAPSULE | Freq: Four times a day (QID) | ORAL | Status: DC

## 2012-12-13 ENCOUNTER — Other Ambulatory Visit (INDEPENDENT_AMBULATORY_CARE_PROVIDER_SITE_OTHER): Payer: BC Managed Care – PPO

## 2012-12-13 DIAGNOSIS — Z1211 Encounter for screening for malignant neoplasm of colon: Secondary | ICD-10-CM

## 2012-12-13 DIAGNOSIS — Z0000 Encounter for general adult medical examination without abnormal findings: Secondary | ICD-10-CM

## 2012-12-13 LAB — HEMOCCULT SLIDES (X 3 CARDS)
Fecal Occult Blood: NEGATIVE
OCCULT 2: NEGATIVE
OCCULT 4: NEGATIVE
OCCULT 5: NEGATIVE

## 2012-12-22 ENCOUNTER — Encounter: Payer: Self-pay | Admitting: Internal Medicine

## 2012-12-23 ENCOUNTER — Other Ambulatory Visit: Payer: Self-pay | Admitting: *Deleted

## 2012-12-23 DIAGNOSIS — N059 Unspecified nephritic syndrome with unspecified morphologic changes: Secondary | ICD-10-CM

## 2012-12-23 DIAGNOSIS — Z0000 Encounter for general adult medical examination without abnormal findings: Secondary | ICD-10-CM

## 2012-12-23 DIAGNOSIS — Z13820 Encounter for screening for osteoporosis: Secondary | ICD-10-CM

## 2012-12-23 DIAGNOSIS — Z23 Encounter for immunization: Secondary | ICD-10-CM

## 2012-12-23 DIAGNOSIS — I10 Essential (primary) hypertension: Secondary | ICD-10-CM

## 2012-12-23 DIAGNOSIS — Z1239 Encounter for other screening for malignant neoplasm of breast: Secondary | ICD-10-CM

## 2012-12-23 DIAGNOSIS — K219 Gastro-esophageal reflux disease without esophagitis: Secondary | ICD-10-CM

## 2012-12-23 MED ORDER — OMEPRAZOLE 40 MG PO CPDR: 40.0000 mg | DELAYED_RELEASE_CAPSULE | Freq: Every day | ORAL | Status: DC

## 2012-12-23 MED ORDER — OLMESARTAN MEDOXOMIL 20 MG PO TABS: 10.0000 mg | ORAL_TABLET | Freq: Every day | ORAL | Status: DC

## 2012-12-23 NOTE — Telephone Encounter (Signed)
Pt sent email needing to start using mail service. Needing Benicar & omeprazole mail to her address...lmb

## 2012-12-27 ENCOUNTER — Other Ambulatory Visit (INDEPENDENT_AMBULATORY_CARE_PROVIDER_SITE_OTHER): Payer: BC Managed Care – PPO

## 2012-12-27 DIAGNOSIS — N390 Urinary tract infection, site not specified: Secondary | ICD-10-CM

## 2012-12-27 DIAGNOSIS — N059 Unspecified nephritic syndrome with unspecified morphologic changes: Secondary | ICD-10-CM

## 2012-12-27 LAB — URINALYSIS, ROUTINE W REFLEX MICROSCOPIC
Bilirubin Urine: NEGATIVE
Nitrite: NEGATIVE
Total Protein, Urine: 100
Urine Glucose: NEGATIVE
pH: 6.5 (ref 5.0–8.0)

## 2012-12-27 LAB — MICROALBUMIN / CREATININE URINE RATIO: Microalb Creat Ratio: 179.9 mg/g — ABNORMAL HIGH (ref 0.0–30.0)

## 2013-01-24 ENCOUNTER — Other Ambulatory Visit: Payer: Self-pay | Admitting: Dermatology

## 2013-08-02 DIAGNOSIS — C44519 Basal cell carcinoma of skin of other part of trunk: Secondary | ICD-10-CM

## 2013-08-02 HISTORY — PX: BASAL CELL CARCINOMA EXCISION: SHX1214

## 2013-08-02 HISTORY — DX: Basal cell carcinoma of skin of other part of trunk: C44.519

## 2013-08-08 ENCOUNTER — Other Ambulatory Visit: Payer: Self-pay | Admitting: Dermatology

## 2013-08-24 ENCOUNTER — Other Ambulatory Visit: Payer: Self-pay | Admitting: Dermatology

## 2013-08-31 ENCOUNTER — Ambulatory Visit (INDEPENDENT_AMBULATORY_CARE_PROVIDER_SITE_OTHER): Payer: BC Managed Care – PPO

## 2013-08-31 DIAGNOSIS — Z23 Encounter for immunization: Secondary | ICD-10-CM

## 2013-10-16 ENCOUNTER — Encounter: Payer: Self-pay | Admitting: Gastroenterology

## 2013-10-20 ENCOUNTER — Ambulatory Visit (INDEPENDENT_AMBULATORY_CARE_PROVIDER_SITE_OTHER): Payer: BC Managed Care – PPO | Admitting: Internal Medicine

## 2013-10-20 ENCOUNTER — Other Ambulatory Visit (INDEPENDENT_AMBULATORY_CARE_PROVIDER_SITE_OTHER): Payer: BC Managed Care – PPO

## 2013-10-20 ENCOUNTER — Encounter: Payer: Self-pay | Admitting: Internal Medicine

## 2013-10-20 VITALS — BP 112/78 | HR 74 | Temp 97.6°F | Ht 63.5 in | Wt 151.8 lb

## 2013-10-20 DIAGNOSIS — K219 Gastro-esophageal reflux disease without esophagitis: Secondary | ICD-10-CM

## 2013-10-20 DIAGNOSIS — N059 Unspecified nephritic syndrome with unspecified morphologic changes: Secondary | ICD-10-CM

## 2013-10-20 DIAGNOSIS — Z0000 Encounter for general adult medical examination without abnormal findings: Secondary | ICD-10-CM

## 2013-10-20 DIAGNOSIS — I4510 Unspecified right bundle-branch block: Secondary | ICD-10-CM | POA: Insufficient documentation

## 2013-10-20 DIAGNOSIS — Z803 Family history of malignant neoplasm of breast: Secondary | ICD-10-CM

## 2013-10-20 DIAGNOSIS — Z1239 Encounter for other screening for malignant neoplasm of breast: Secondary | ICD-10-CM

## 2013-10-20 DIAGNOSIS — I10 Essential (primary) hypertension: Secondary | ICD-10-CM

## 2013-10-20 LAB — URINALYSIS, ROUTINE W REFLEX MICROSCOPIC
Bilirubin Urine: NEGATIVE
Nitrite: NEGATIVE
Urobilinogen, UA: 0.2 (ref 0.0–1.0)

## 2013-10-20 LAB — CBC WITH DIFFERENTIAL/PLATELET
Basophils Absolute: 0 10*3/uL (ref 0.0–0.1)
Basophils Relative: 0.3 % (ref 0.0–3.0)
Eosinophils Absolute: 0.2 10*3/uL (ref 0.0–0.7)
Hemoglobin: 12.5 g/dL (ref 12.0–15.0)
Lymphocytes Relative: 25.4 % (ref 12.0–46.0)
MCV: 83.7 fl (ref 78.0–100.0)
Monocytes Relative: 7.4 % (ref 3.0–12.0)
Platelets: 294 10*3/uL (ref 150.0–400.0)
RDW: 15.2 % — ABNORMAL HIGH (ref 11.5–14.6)
WBC: 7.3 10*3/uL (ref 4.5–10.5)

## 2013-10-20 LAB — BASIC METABOLIC PANEL
BUN: 26 mg/dL — ABNORMAL HIGH (ref 6–23)
CO2: 27 mEq/L (ref 19–32)
Creatinine, Ser: 1.3 mg/dL — ABNORMAL HIGH (ref 0.4–1.2)
GFR: 44.23 mL/min — ABNORMAL LOW (ref >60.00)
Glucose, Bld: 96 mg/dL (ref 70–99)
Potassium: 4.2 mEq/L (ref 3.5–5.1)
Sodium: 140 mEq/L (ref 135–145)

## 2013-10-20 LAB — LIPID PANEL
Cholesterol: 243 mg/dL — ABNORMAL HIGH (ref 0–200)
Total CHOL/HDL Ratio: 5
Triglycerides: 157 mg/dL — ABNORMAL HIGH (ref 0.0–149.0)
VLDL: 31.4 mg/dL (ref 0.0–40.0)

## 2013-10-20 LAB — HEPATIC FUNCTION PANEL
AST: 24 U/L (ref 0–37)
Albumin: 3.5 g/dL (ref 3.5–5.2)
Alkaline Phosphatase: 126 U/L — ABNORMAL HIGH (ref 39–117)
Total Protein: 6.7 g/dL (ref 6.0–8.3)

## 2013-10-20 LAB — TSH: TSH: 1.27 u[IU]/mL (ref 0.35–5.50)

## 2013-10-20 LAB — MICROALBUMIN / CREATININE URINE RATIO
Microalb Creat Ratio: 139.2 mg/g — ABNORMAL HIGH (ref 0.0–30.0)
Microalb, Ur: 120.7 mg/dL — ABNORMAL HIGH (ref 0.0–1.9)

## 2013-10-20 MED ORDER — OLMESARTAN MEDOXOMIL 20 MG PO TABS: 10.0000 mg | ORAL_TABLET | Freq: Every day | ORAL | Status: DC

## 2013-10-20 MED ORDER — OMEPRAZOLE 40 MG PO CPDR: 40.0000 mg | DELAYED_RELEASE_CAPSULE | Freq: Every day | ORAL | Status: DC

## 2013-10-20 NOTE — Assessment & Plan Note (Signed)
Never with reflux symptoms but positive Barrett's changes on EGD Follows with GI for same -continue surveillance as planned Continue proton pump inhibitor daily

## 2013-10-20 NOTE — Patient Instructions (Addendum)
It was good to see you today.  We have reviewed your medical history and available test results today  Health Maintenance reviewed - mammogram, breast MRI ordered today - no need for Pap; immunizations and colon screening up-to-date.   Test(s) ordered today including microalbumin and creatinine ratio for nephritis screening. Your results will be released to [facility] (or called to you) after review, usually within 72hours after test completion. If any changes need to be made, you will be notified at that same time.  Medications reviewed and updated - Refill on medication(s) as discussed today.  Please schedule followup annually for medical physical and labs, call sooner if problems.  Health Maintenance, Female A healthy lifestyle and preventative care can promote health and wellness.  Maintain regular health, dental, and eye exams.  Eat a healthy diet. Foods like vegetables, fruits, whole grains, low-fat dairy products, and lean protein foods contain the nutrients you need without too many calories. Decrease your intake of foods high in solid fats, added sugars, and salt. Get information about a proper diet from your caregiver, if necessary.  Regular physical exercise is one of the most important things you can do for your health. Most adults should get at least 150 minutes of moderate-intensity exercise (any activity that increases your heart rate and causes you to sweat) each week. In addition, most adults need muscle-strengthening exercises on 2 or more days a week.   Maintain a healthy weight. The body mass index (BMI) is a screening tool to identify possible weight problems. It provides an estimate of body fat based on height and weight. Your caregiver can help determine your BMI, and can help you achieve or maintain a healthy weight. For adults 20 years and older:  A BMI below 18.5 is considered underweight.  A BMI of 18.5 to 24.9 is normal.  A BMI of 25 to 29.9 is considered  overweight.  A BMI of 30 and above is considered obese.  Maintain normal blood lipids and cholesterol by exercising and minimizing your intake of saturated fat. Eat a balanced diet with plenty of fruits and vegetables. Blood tests for lipids and cholesterol should begin at age 59 and be repeated every 5 years. If your lipid or cholesterol levels are high, you are over 50, or you are a high risk for heart disease, you may need your cholesterol levels checked more frequently.Ongoing high lipid and cholesterol levels should be treated with medicines if diet and exercise are not effective.  If you smoke, find out from your caregiver how to quit. If you do not use tobacco, do not start.  Lung cancer screening is recommended for adults aged 48 80 years who are at high risk for developing lung cancer because of a history of smoking. Yearly low-dose computed tomography (CT) is recommended for people who have at least a 30-pack-year history of smoking and are a current smoker or have quit within the past 15 years. A pack year of smoking is smoking an average of 1 pack of cigarettes a day for 1 year (for example: 1 pack a day for 30 years or 2 packs a day for 15 years). Yearly screening should continue until the smoker has stopped smoking for at least 15 years. Yearly screening should also be stopped for people who develop a health problem that would prevent them from having lung cancer treatment.  If you are pregnant, do not drink alcohol. If you are breastfeeding, be very cautious about drinking alcohol. If you are not  pregnant and choose to drink alcohol, do not exceed 1 drink per day. One drink is considered to be 12 ounces (355 mL) of beer, 5 ounces (148 mL) of wine, or 1.5 ounces (44 mL) of liquor.  Avoid use of street drugs. Do not share needles with anyone. Ask for help if you need support or instructions about stopping the use of drugs.  High blood pressure causes heart disease and increases the risk  of stroke. Blood pressure should be checked at least every 1 to 2 years. Ongoing high blood pressure should be treated with medicines, if weight loss and exercise are not effective.  If you are 41 to 61 years old, ask your caregiver if you should take aspirin to prevent strokes.  Diabetes screening involves taking a blood sample to check your fasting blood sugar level. This should be done once every 3 years, after age 36, if you are within normal weight and without risk factors for diabetes. Testing should be considered at a younger age or be carried out more frequently if you are overweight and have at least 1 risk factor for diabetes.  Breast cancer screening is essential preventative care for women. You should practice "breast self-awareness." This means understanding the normal appearance and feel of your breasts and may include breast self-examination. Any changes detected, no matter how small, should be reported to a caregiver. Women in their 81s and 30s should have a clinical breast exam (CBE) by a caregiver as part of a regular health exam every 1 to 3 years. After age 54, women should have a CBE every year. Starting at age 47, women should consider having a mammogram (breast X-ray) every year. Women who have a family history of breast cancer should talk to their caregiver about genetic screening. Women at a high risk of breast cancer should talk to their caregiver about having an MRI and a mammogram every year.  Breast cancer gene (BRCA)-related cancer risk assessment is recommended for women who have family members with BRCA-related cancers. BRCA-related cancers include breast, ovarian, tubal, and peritoneal cancers. Having family members with these cancers may be associated with an increased risk for harmful changes (mutations) in the breast cancer genes BRCA1 and BRCA2. Results of the assessment will determine the need for genetic counseling and BRCA1 and BRCA2 testing.  The Pap test is a  screening test for cervical cancer. Women should have a Pap test starting at age 79. Between ages 29 and 50, Pap tests should be repeated every 2 years. Beginning at age 58, you should have a Pap test every 3 years as long as the past 3 Pap tests have been normal. If you had a hysterectomy for a problem that was not cancer or a condition that could lead to cancer, then you no longer need Pap tests. If you are between ages 89 and 53, and you have had normal Pap tests going back 10 years, you no longer need Pap tests. If you have had past treatment for cervical cancer or a condition that could lead to cancer, you need Pap tests and screening for cancer for at least 20 years after your treatment. If Pap tests have been discontinued, risk factors (such as a new sexual partner) need to be reassessed to determine if screening should be resumed. Some women have medical problems that increase the chance of getting cervical cancer. In these cases, your caregiver may recommend more frequent screening and Pap tests.  The human papillomavirus (HPV) test is an  additional test that may be used for cervical cancer screening. The HPV test looks for the virus that can cause the cell changes on the cervix. The cells collected during the Pap test can be tested for HPV. The HPV test could be used to screen women aged 70 years and older, and should be used in women of any age who have unclear Pap test results. After the age of 65, women should have HPV testing at the same frequency as a Pap test.  Colorectal cancer can be detected and often prevented. Most routine colorectal cancer screening begins at the age of 34 and continues through age 38. However, your caregiver may recommend screening at an earlier age if you have risk factors for colon cancer. On a yearly basis, your caregiver may provide home test kits to check for hidden blood in the stool. Use of a small camera at the end of a tube, to directly examine the colon  (sigmoidoscopy or colonoscopy), can detect the earliest forms of colorectal cancer. Talk to your caregiver about this at age 19, when routine screening begins. Direct examination of the colon should be repeated every 5 to 10 years through age 36, unless early forms of pre-cancerous polyps or small growths are found.  Hepatitis C blood testing is recommended for all people born from [birth year] through 1964 and any individual with known risks for hepatitis C.  Practice safe sex. Use condoms and avoid high-risk sexual practices to reduce the spread of sexually transmitted infections (STIs). Sexually active women aged 62 and younger should be checked for Chlamydia, which is a common sexually transmitted infection. Older women with new or multiple partners should also be tested for Chlamydia. Testing for other STIs is recommended if you are sexually active and at increased risk.  Osteoporosis is a disease in which the bones lose minerals and strength with aging. This can result in serious bone fractures. The risk of osteoporosis can be identified using a bone density scan. Women ages 90 and over and women at risk for fractures or osteoporosis should discuss screening with their caregivers. Ask your caregiver whether you should be taking a calcium supplement or vitamin D to reduce the rate of osteoporosis.  Menopause can be associated with physical symptoms and risks. Hormone replacement therapy is available to decrease symptoms and risks. You should talk to your caregiver about whether hormone replacement therapy is right for you.  Use sunscreen. Apply sunscreen liberally and repeatedly throughout the day. You should seek shade when your shadow is shorter than you. Protect yourself by wearing long sleeves, pants, a wide-brimmed hat, and sunglasses year round, whenever you are outdoors.  Notify your caregiver of new moles or changes in moles, especially if there is a change in shape or color. Also notify your  caregiver if a mole is larger than the size of a pencil eraser.  Stay current with your immunizations. Document Released: 05/04/2011 Document Revised: 02/13/2013 Document Reviewed: 05/04/2011 [facility] Patient Information 2014 [place].

## 2013-10-20 NOTE — Progress Notes (Signed)
Subjective:    Patient ID: Sarah Valentine, female    DOB: [birth year], 62 y.o.   MRN: [id number]  HPI patient is here today for annual physical. Patient feels well and has no complaints.  Also reviewed chronic medical issues  Hypertension. On ARB with good control - the patient reports compliance with medication(s) as prescribed. Denies adverse side effects. Family history of nephritis on maternal side, terminal for men with the associated disease. No personal history of renal problems  Melanoma history 2011. Excision from left buttock, in situ. Follows regularly with dermatology for maintenance and surveillance of same  GERD, history of Barrett's. Family history of esophageal cancer noted. Follows with GI for same. Never with dyspepsia symptoms - currently proton pump used daily with near 100% compliance. No dysphasia or dyspepsia.  Past Medical History  Diagnosis Date  . [place] 2010  . Hypertension   . Hx of adenomatous colonic polyps 12/2008  . Hyperlipidemia   . 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 GERD (gastroesophageal reflux disease)   . Migraines   . BCC (basal cell carcinoma), trunk 08/2013    removed from back ([place])   Family History  Problem Relation Age of Onset  . Breast cancer Mother 54  . Arthritis Mother   . Hyperlipidemia Mother   . Heart disease Mother   . Hypertension Mother   . Kidney disease Mother   . Diabetes Mother   . Cancer Father     Esophagus  . Arthritis Father   . Colon cancer Maternal Grandmother   . Arthritis Maternal Grandmother   . Hypertension Maternal Grandmother   . Kidney disease Maternal Grandmother   . Breast cancer Maternal Aunt     x 2 aunt  . Arthritis Other   . Breast cancer Sister 26    Pt states all female family members has had breast cancer  . Kidney disease Other     [place] nephritis -pt states mostly maternal side of family as the disease  . Breast cancer Maternal  Grandmother    History  Substance Use Topics  . Smoking status: Never Smoker   . 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 Alcohol Use: No    Review of Systems  Constitutional: Negative for fatigue and unexpected weight change.  Respiratory: Negative for cough, shortness of breath and wheezing.   Cardiovascular: Negative for chest pain, palpitations and leg swelling.  Gastrointestinal: Negative for nausea, abdominal pain and diarrhea.  Neurological: Negative for dizziness, weakness, light-headedness and headaches.  Psychiatric/Behavioral: Negative for dysphoric mood. The patient is not nervous/anxious.   All other systems reviewed and are negative.        Objective:   Physical Exam BP 112/78  Pulse 74  Temp(Src) 97.6 F (36.4 C) (Oral)  Ht 5' 3.5" (1.613 m)  Wt 151 lb 12.8 oz (68.856 kg)  BMI 26.47 kg/m2  SpO2 96% Wt Readings from Last 3 Encounters:  10/20/13 151 lb 12.8 oz (68.856 kg)  10/21/12 149 lb 1.9 oz (67.64 kg)  09/28/11 147 lb 1.9 oz (66.733 kg)   Constitutional: She appears well-developed and well-nourished. No distress.  HENT: Head: Normocephalic and atraumatic. Ears: B TMs ok, no erythema or effusion; Nose: Nose normal.  Mouth/Throat: Oropharynx is clear and moist. No oropharyngeal exudate.  Eyes: Conjunctivae and EOM are normal. Pupils are  equal, round, and reactive to light. No scleral icterus.  Neck: Normal range of motion. Neck supple. No JVD present. No thyromegaly present.  Cardiovascular: Normal rate, regular rhythm and normal heart sounds.  No murmur heard. No BLE edema. Pulmonary/Chest: Effort normal and breath sounds normal. No respiratory distress. She has no wheezes.  Abdominal: Soft. Bowel sounds are normal. She exhibits no distension. There is no tenderness. no masses Musculoskeletal: Normal range of motion, no joint effusions. No gross  deformities Neurological: She is alert and oriented to person, place, and time. No cranial nerve deficit. Coordination normal.  Skin: Skin is warm and dry. No rash noted. No erythema.  Psychiatric: She has a normal mood and affect. Her behavior is normal. Judgment and thought content normal.   Lab Results  Component Value Date   WBC 8.4 12/08/2012   HGB 12.3 12/08/2012   HCT 37.1 12/08/2012   PLT 270.0 12/08/2012   GLUCOSE 105* 12/08/2012   CHOL 201* 12/08/2012   TRIG 188.0* 12/08/2012   HDL 46.50 12/08/2012   LDLDIRECT 114.9 12/08/2012   ALT 21 12/08/2012   AST 24 12/08/2012   NA 141 12/08/2012   K 4.7 12/08/2012   CL 106 12/08/2012   CREATININE 1.2 12/08/2012   BUN 19 12/08/2012   CO2 29 12/08/2012   TSH 0.89 12/08/2012   MICROALBUR 67.4* 12/27/2012   ECG: Sinus at 72 beats per minute. Incomplete right bundle branch block. No prior ECG to compare    Assessment & Plan:  CPX - v70.0 - Patient has been counseled on age-appropriate routine health concerns for screening and prevention. These are reviewed and up-to-date. Immunizations are up-to-date or declined. Labs ordered and will be reviewed. PAP not indicated  Screening for breast cancer - strong FH same - 2 sisters with dz ages 62 and 47, also maternal side - has considered prophylactic B mastectomy and discussed with streck and plastics for same in 2011, but coverage for prophylactic surgery denied by insurance - has instead opted for annual MRI to survey for same, last done 04/2010. BRAC genetic testing negative -   Family history nephritis - personal history of hypertension, prior evaluation at [facility] - check urine for microalbumin/creatinine ratio now, refer to [facility] if increasing trend from last year  ?incomplete right bundle branch block. Routine screening ECG reviewed. Asymptomatic. No prior comparison. Noted in chart, no changes recommended

## 2013-10-20 NOTE — Progress Notes (Signed)
Pre-visit discussion using our clinic review tool. No additional management support is needed unless otherwise documented below in the visit note.  

## 2013-10-20 NOTE — Assessment & Plan Note (Signed)
On therapy with good control for many years Family history of nephritis reviewed - will check associated labs and urine microbial and/creatinine ratio now The current medical regimen is effective;  continue present plan and medications.  BP Readings from Last 3 Encounters:  10/20/13 112/78  10/21/12 110/70  09/28/11 102/70

## 2013-10-23 ENCOUNTER — Encounter: Payer: Self-pay | Admitting: Internal Medicine

## 2013-10-23 DIAGNOSIS — R809 Proteinuria, unspecified: Secondary | ICD-10-CM

## 2013-10-23 DIAGNOSIS — E785 Hyperlipidemia, unspecified: Secondary | ICD-10-CM

## 2013-10-23 MED ORDER — AMPICILLIN 500 MG PO CAPS: 500.0000 mg | ORAL_CAPSULE | Freq: Four times a day (QID) | ORAL | Status: DC

## 2013-11-07 ENCOUNTER — Encounter: Payer: Self-pay | Admitting: Internal Medicine

## 2013-11-07 DIAGNOSIS — M810 Age-related osteoporosis without current pathological fracture: Secondary | ICD-10-CM

## 2013-11-23 ENCOUNTER — Ambulatory Visit: Payer: BC Managed Care – PPO

## 2013-11-24 ENCOUNTER — Ambulatory Visit: Payer: BC Managed Care – PPO

## 2013-11-27 ENCOUNTER — Other Ambulatory Visit: Payer: BC Managed Care – PPO

## 2013-12-06 ENCOUNTER — Ambulatory Visit
Admission: RE | Admit: 2013-12-06 | Discharge: 2013-12-06 | Disposition: A | Discharge status: Home / Self Care | Payer: BC Managed Care – PPO | Source: Ambulatory Visit | Attending: Internal Medicine | Admitting: Internal Medicine

## 2013-12-06 DIAGNOSIS — M810 Age-related osteoporosis without current pathological fracture: Secondary | ICD-10-CM

## 2013-12-06 DIAGNOSIS — Z1239 Encounter for other screening for malignant neoplasm of breast: Secondary | ICD-10-CM

## 2013-12-06 DIAGNOSIS — Z803 Family history of malignant neoplasm of breast: Secondary | ICD-10-CM

## 2013-12-11 ENCOUNTER — Ambulatory Visit
Admission: RE | Admit: 2013-12-11 | Discharge: 2013-12-11 | Disposition: A | Discharge status: Home / Self Care | Payer: BC Managed Care – PPO | Source: Ambulatory Visit | Attending: Internal Medicine | Admitting: Internal Medicine

## 2013-12-11 DIAGNOSIS — Z1239 Encounter for other screening for malignant neoplasm of breast: Secondary | ICD-10-CM

## 2013-12-11 DIAGNOSIS — Z803 Family history of malignant neoplasm of breast: Secondary | ICD-10-CM

## 2013-12-11 MED ORDER — GADOBENATE DIMEGLUMINE 529 MG/ML IV SOLN
7.0000 mL | Freq: Once | INTRAVENOUS | Status: AC | PRN
  Administered 2013-12-11: 7 mL via INTRAVENOUS

## 2014-01-31 DIAGNOSIS — D049 Carcinoma in situ of skin, unspecified: Secondary | ICD-10-CM | POA: Insufficient documentation

## 2014-01-31 HISTORY — DX: Carcinoma in situ of skin, unspecified: D04.9

## 2014-01-31 HISTORY — PX: SQUAMOUS CELL CARCINOMA EXCISION: SHX2433

## 2014-02-22 ENCOUNTER — Other Ambulatory Visit: Payer: Self-pay | Admitting: Dermatology

## 2014-03-27 ENCOUNTER — Other Ambulatory Visit: Payer: Self-pay | Admitting: Dermatology

## 2014-04-03 ENCOUNTER — Encounter: Payer: Self-pay | Admitting: Gastroenterology

## 2014-04-26 ENCOUNTER — Ambulatory Visit (INDEPENDENT_AMBULATORY_CARE_PROVIDER_SITE_OTHER): Payer: BC Managed Care – PPO | Admitting: Internal Medicine

## 2014-04-26 ENCOUNTER — Encounter: Payer: Self-pay | Admitting: Internal Medicine

## 2014-04-26 VITALS — BP 106/70 | HR 64 | Temp 98.6°F | Resp 16 | Wt 145.0 lb

## 2014-04-26 DIAGNOSIS — J020 Streptococcal pharyngitis: Secondary | ICD-10-CM

## 2014-04-26 DIAGNOSIS — J029 Acute pharyngitis, unspecified: Secondary | ICD-10-CM

## 2014-04-26 LAB — POCT RAPID STREP A (OFFICE): Rapid Strep A Screen: POSITIVE — AB

## 2014-04-26 MED ORDER — AZITHROMYCIN 250 MG PO TABS: ORAL_TABLET | ORAL | Status: DC

## 2014-04-26 NOTE — Progress Notes (Signed)
Pre visit review using our clinic review tool, if applicable. No additional management support is needed unless otherwise documented below in the visit note. 

## 2014-04-26 NOTE — Assessment & Plan Note (Signed)
Strep test + OTC meds prn Zpac

## 2014-04-26 NOTE — Assessment & Plan Note (Signed)
Strep test OTC meds prn Zpac if worse

## 2014-04-26 NOTE — Addendum Note (Signed)
Addended by: Cresenciano Lick on: 04/26/2014 09:41 AM   Modules accepted: Orders

## 2014-04-26 NOTE — Progress Notes (Signed)
   Subjective:    Patient ID: Sarah Valentine, female    DOB: [birth year], 63 y.o.   MRN: [id number]  Sore Throat  This is a new problem. The current episode started in the past 7 days. The pain is moderate. Pertinent negatives include no abdominal pain, congestion, coughing, ear pain or headaches. She has had no exposure to strep or mono. She has tried acetaminophen for the symptoms. The treatment provided mild relief.      Review of Systems  Constitutional: Negative for chills, activity change, appetite change, fatigue and unexpected weight change.  HENT: Positive for sore throat. Negative for congestion, ear pain, mouth sores and sinus pressure.   Eyes: Negative for visual disturbance.  Respiratory: Negative for cough and chest tightness.   Gastrointestinal: Negative for nausea and abdominal pain.  Genitourinary: Negative for frequency, difficulty urinating and vaginal pain.  Musculoskeletal: Negative for back pain and gait problem.  Skin: Negative for pallor and rash.  Neurological: Negative for dizziness, tremors, weakness, numbness and headaches.  Psychiatric/Behavioral: Negative for confusion and sleep disturbance.       Objective:   Physical Exam  Constitutional: She appears well-developed. No distress.  HENT:  Head: Normocephalic.  Right Ear: External ear normal.  Left Ear: External ear normal.  Mouth/Throat: No oropharyngeal exudate.  eryth throat  Eyes: Conjunctivae are normal. Pupils are equal, round, and reactive to light. Right eye exhibits no discharge. Left eye exhibits no discharge.  Neck: Normal range of motion. Neck supple. No JVD present. No tracheal deviation present. No thyromegaly present.  Cardiovascular: Normal rate, regular rhythm and normal heart sounds.   Pulmonary/Chest: No stridor. No respiratory distress. She has no wheezes.  Abdominal: Soft. Bowel sounds are normal. She exhibits no distension and no mass. There is no tenderness. There is no rebound  and no guarding.  Musculoskeletal: She exhibits no edema and no tenderness.  Lymphadenopathy:    She has no cervical adenopathy.  Neurological: She displays normal reflexes. No cranial nerve deficit. She exhibits normal muscle tone. Coordination normal.  Skin: No rash noted. No erythema.  Psychiatric: She has a normal mood and affect. Her behavior is normal. Judgment and thought content normal.          Assessment & Plan:

## 2014-05-18 ENCOUNTER — Encounter: Payer: Self-pay | Admitting: Gastroenterology

## 2014-05-18 ENCOUNTER — Telehealth: Payer: Self-pay | Admitting: Gastroenterology

## 2014-05-18 NOTE — Telephone Encounter (Signed)
Dr. Ardis Hughs please review her chart and advise if she is due for a colonoscopy.  Her last colon was 12/28/08 and she had a tubular adenoma.  Please advise colon recall

## 2014-05-22 NOTE — Telephone Encounter (Signed)
Pt aware procedure has been scheduled as well as pre visit

## 2014-05-22 NOTE — Telephone Encounter (Signed)
Yes, she needs surveillance colonoscopy for small adenoma 2010.  Thanks

## 2014-05-25 ENCOUNTER — Encounter: Payer: Self-pay | Admitting: Internal Medicine

## 2014-05-25 ENCOUNTER — Ambulatory Visit (INDEPENDENT_AMBULATORY_CARE_PROVIDER_SITE_OTHER): Payer: BC Managed Care – PPO | Admitting: Internal Medicine

## 2014-05-25 VITALS — BP 100/70 | HR 92 | Temp 98.0°F | Wt 146.4 lb

## 2014-05-25 DIAGNOSIS — J029 Acute pharyngitis, unspecified: Secondary | ICD-10-CM

## 2014-05-25 DIAGNOSIS — J020 Streptococcal pharyngitis: Secondary | ICD-10-CM

## 2014-05-25 LAB — POCT RAPID STREP A (OFFICE): Rapid Strep A Screen: POSITIVE — AB

## 2014-05-25 MED ORDER — AMOXICILLIN 500 MG PO CAPS: 500.0000 mg | ORAL_CAPSULE | Freq: Three times a day (TID) | ORAL | Status: DC

## 2014-05-25 NOTE — Progress Notes (Signed)
   Subjective:    Patient ID: Sarah Valentine, female    DOB: 1951-06-01, 63 y.o.   MRN: [id number]  HPI   She was seen 04/26/14 for sore throat which was strep positive on rapid testing. She was given a Z-Pak with improvement; she's had mild residual symptoms of sore throat.  Her daughter and son in law have been diagnosed with strep pharyngitis. The granddaughter has been diagnosed with otitis earlier this week. She describes a temp up to 101 throughout the day on 7/22. Tylenol was of some benefit. This was associated with arthralgias and myalgias, frontal headache, and fatigue. She did feel that her submandibular glands were swollen.    Review of Systems She denies frontal sinus pain, facial pain, nasal purulence, dental pain, otic pain, otic discharge, cough, sputum production, or dyspnea She also has no extrinsic symptoms of itchy, watery eyes, sneezing.    Objective:   Physical Exam General appearance:good health ;well nourished; no acute distress or increased work of breathing is present.  No  lymphadenopathy about the head, neck, or axilla noted.   Eyes: No conjunctival inflammation or lid edema is present. There is no scleral icterus.  Ears:  External ear exam shows no significant lesions or deformities.  Otoscopic examination reveals clear canals, tympanic membranes are intact bilaterally without bulging, retraction, inflammation or discharge.  Nose:  External nasal examination shows no deformity or inflammation. Nasal mucosa are pink and moist without lesions or exudates. No septal dislocation or deviation.No obstruction to airflow.   Oral exam: Dental hygiene is good; lips and gums are healthy appearing.There is no oropharyngeal erythema or exudate noted.   Neck:  No deformities, thyromegaly, masses, or tenderness noted.   Supple with full range of motion without pain.   Heart:  Normal rate and regular rhythm. S1 and S2 normal without gallop, murmur, click, rub or other extra  sounds.   Lungs:Chest clear to auscultation; no wheezes, rhonchi,rales ,or rubs present.No increased work of breathing.    Extremities:  No cyanosis, edema, or clubbing  noted    Skin: Warm & dry w/o jaundice or tenting.         Assessment & Plan:  #1 pharyngitis with Strep exposure Plan: Rapid Strep : FAINTLY + !

## 2014-05-25 NOTE — Patient Instructions (Signed)
Plain Mucinex (NOT D) for thick secretions ;force NON dairy fluids .   Nasal cleansing in the shower as discussed with lather of mild shampoo.After 10 seconds wash off lather while  exhaling through nostrils. Make sure that all residual soap is removed to prevent irritation.  Flonase OR Nasacort AQ 1 spray in each nostril twice a day as needed. Use the "crossover" technique into opposite nostril spraying toward opposite ear @ 45 degree angle, not straight up into nostril.  Use a Neti pot daily only  as needed for significant sinus congestion; going from open side to congested side . Plain Allegra (NOT D )  160 daily , Loratidine 10 mg , OR Zyrtec 10 mg @ bedtime  as needed for itchy eyes & sneezing.  Zicam Melts or Zinc lozenges as per package label for scratchy throat . Complementary options include  vitamin C 2000 mg daily; & Echinacea for 4-7 days. Report persistent or progressive fever; discolored nasal or chest secretions; or frontal headache or facial  pain.

## 2014-05-25 NOTE — Progress Notes (Signed)
   Subjective:    Patient ID: Sarah Valentine, female    DOB: 07-May-1951, 63 y.o.   MRN: [id number]  HPI Pt seen 04/26/14 for sore throat. She tested positive for strep, despite positive findings on PE and was given Zpack. Her sore throat improved after the Zpack but never fully went away. Today the pt continues to have the mildly sore throat as well as arthralgias. Of note the pt's daughter, son in law, and 72 month old granddaughter have been treated for strep and an ear infection earlier this week. She worries she had resistant strep that caused her family to get sick   Two days ago pt had 101 fever all day. She took tylenol, mildly effective. She had body aches, headache and fatigue and felt her submandibular glands were swollen. No other URI symptoms. No cough, nasal congestion, sinus pressure, earache, ear discharge. No N/V  Review of Systems  Constitutional: Negative for chills.  HENT: Negative for congestion, ear discharge, ear pain, postnasal drip, rhinorrhea, sinus pressure and sneezing.   Respiratory: Negative for cough and shortness of breath.   Gastrointestinal: Negative for nausea and vomiting.       Objective:   Physical Exam Mildly erythematous oropharynx  Tonsillar indentations      Assessment & Plan:  #1 pharyngitis; re-cx throat give Amoxcillin Rx

## 2014-05-25 NOTE — Progress Notes (Signed)
Pre visit review using our clinic review tool, if applicable. No additional management support is needed unless otherwise documented below in the visit note. 

## 2014-07-30 ENCOUNTER — Ambulatory Visit (AMBULATORY_SURGERY_CENTER): Payer: BC Managed Care – PPO | Admitting: *Deleted

## 2014-07-30 VITALS — Ht 63.5 in | Wt 147.8 lb

## 2014-07-30 DIAGNOSIS — Z86010 Personal history of colonic polyps: Secondary | ICD-10-CM

## 2014-07-30 DIAGNOSIS — K2270 Barrett's esophagus without dysplasia: Secondary | ICD-10-CM

## 2014-07-30 MED ORDER — MOVIPREP 100 G PO SOLR: ORAL | Status: DC

## 2014-07-30 NOTE — Progress Notes (Signed)
No allergies to eggs or soy. No problems with anesthesia.  Pt given Emmi instructions for colonoscopy/EGD  No oxygen use  No diet drug use

## 2014-08-01 ENCOUNTER — Encounter: Payer: BC Managed Care – PPO | Admitting: Gastroenterology

## 2014-08-07 ENCOUNTER — Encounter: Payer: Self-pay | Admitting: Gastroenterology

## 2014-08-13 ENCOUNTER — Encounter: Payer: Self-pay | Admitting: Gastroenterology

## 2014-08-13 ENCOUNTER — Ambulatory Visit (AMBULATORY_SURGERY_CENTER): Payer: BC Managed Care – PPO | Admitting: Gastroenterology

## 2014-08-13 VITALS — BP 110/77 | HR 63 | Temp 97.6°F | Resp 16

## 2014-08-13 DIAGNOSIS — D124 Benign neoplasm of descending colon: Secondary | ICD-10-CM

## 2014-08-13 DIAGNOSIS — Z86010 Personal history of colonic polyps: Secondary | ICD-10-CM

## 2014-08-13 DIAGNOSIS — D122 Benign neoplasm of ascending colon: Secondary | ICD-10-CM

## 2014-08-13 DIAGNOSIS — K635 Polyp of colon: Secondary | ICD-10-CM

## 2014-08-13 DIAGNOSIS — D123 Benign neoplasm of transverse colon: Secondary | ICD-10-CM

## 2014-08-13 DIAGNOSIS — K2270 Barrett's esophagus without dysplasia: Secondary | ICD-10-CM

## 2014-08-13 MED ORDER — SODIUM CHLORIDE 0.9 % IV SOLN: 500.0000 mL | INTRAVENOUS | Status: DC

## 2014-08-13 NOTE — Patient Instructions (Signed)
Impressions/recommendations:  Endoscopy:  Irregular z line, no nodules or inflammation. Await pathology results.  Colonoscopy:  Polyps (handout given)  Await pathology results for next colonoscopy recall.  YOU HAD AN ENDOSCOPIC PROCEDURE TODAY AT [facility]: Refer to the procedure report that was given to you for any specific questions about what was found during the examination.  If the procedure report does not answer your questions, please call your gastroenterologist to clarify.  If you requested that your care partner not be given the details of your procedure findings, then the procedure report has been included in a sealed envelope for you to review at your convenience later.  YOU SHOULD EXPECT: Some feelings of bloating in the abdomen. Passage of more gas than usual.  Walking can help get rid of the air that was put into your GI tract during the procedure and reduce the bloating. If you had a lower endoscopy (such as a colonoscopy or flexible sigmoidoscopy) you may notice spotting of blood in your stool or on the toilet paper. If you underwent a bowel prep for your procedure, then you may not have a normal bowel movement for a few days.  DIET: Your first meal following the procedure should be a light meal and then it is ok to progress to your normal diet.  A half-sandwich or bowl of soup is an example of a good first meal.  Heavy or fried foods are harder to digest and may make you feel nauseous or bloated.  Likewise meals heavy in dairy and vegetables can cause extra gas to form and this can also increase the bloating.  Drink plenty of fluids but you should avoid alcoholic beverages for 24 hours.  ACTIVITY: Your care partner should take you home directly after the procedure.  You should plan to take it easy, moving slowly for the rest of the day.  You can resume normal activity the day after the procedure however you should NOT DRIVE or use heavy machinery for 24 hours  (because of the sedation medicines used during the test).    SYMPTOMS TO REPORT IMMEDIATELY: A gastroenterologist can be reached at any hour.  During normal business hours, 8:30 AM to 5:00 PM Monday through Friday, call [phone].  After hours and on weekends, please call the GI answering service at [phone] who will take a message and have the physician on call contact you.   Following lower endoscopy (colonoscopy or flexible sigmoidoscopy):  Excessive amounts of blood in the stool  Significant tenderness or worsening of abdominal pains  Swelling of the abdomen that is new, acute  Fever of 100F or higher  Following upper endoscopy (EGD)  Vomiting of blood or coffee ground material  New chest pain or pain under the shoulder blades  Painful or persistently difficult swallowing  New shortness of breath  Fever of 100F or higher  Black, tarry-looking stools  FOLLOW UP: If any biopsies were taken you will be contacted by phone or by letter within the next 1-3 weeks.  Call your gastroenterologist if you have not heard about the biopsies in 3 weeks.  Our staff will call the home number listed on your records the next business day following your procedure to check on you and address any questions or concerns that you may have at that time regarding the information given to you following your procedure. This is a courtesy call and so if there is no answer at the home number and we have not  heard from you through the emergency physician on call, we will assume that you have returned to your regular daily activities without incident.  SIGNATURES/CONFIDENTIALITY: You and/or your care partner have signed paperwork which will be entered into your electronic medical record.  These signatures attest to the fact that that the information above on your After Visit Summary has been reviewed and is understood.  Full responsibility of the confidentiality of this discharge information lies with you  and/or your care-partner.

## 2014-08-13 NOTE — Op Note (Signed)
[place]  [address] [place], [id number]   COLONOSCOPY PROCEDURE REPORT  PATIENT: Sarah Valentine, Sarah Valentine  MR#: [id number] BIRTHDATE: [birth year] , 49  yrs. old GENDER: female ENDOSCOPIST: Milus Banister, MD PROCEDURE DATE:  08/13/2014 PROCEDURE:   Colonoscopy with snare polypectomy First Screening Colonoscopy - Avg.  risk and is 50 yrs.  old or older - No.  Prior Negative Screening - Now for repeat screening. N/A  History of Adenoma - Now for follow-up colonoscopy & has been > or = to 3 yrs.  Yes hx of adenoma.  Has been 3 or more years since last colonoscopy.  Polyps Removed Today? Yes. ASA CLASS:   Class II INDICATIONS:57mm adenomatous polyp removed 5 years ago in [place].  MEDICATIONS: Monitored anesthesia care and Propofol 250 mg IV  DESCRIPTION OF PROCEDURE:   After the risks benefits and alternatives of the procedure were thoroughly explained, informed consent was obtained.  The digital rectal exam revealed no abnormalities of the rectum.   The LB [id number]  endoscope was introduced through the anus and advanced to the cecum, which was identified by both the appendix and ileocecal valve. No adverse events experienced.   The quality of the prep was excellent.  The instrument was then slowly withdrawn as the colon was fully examined.  COLON FINDINGS: Five sessile polyps ranging between 3-71mm in size were found in the descending colon, transverse colon, and ascending colon.  Polypectomies were performed with a cold snare.  The resection was complete, the polyp tissue was completely retrieved and sent to histology.   The examination was otherwise normal. Retroflexed views revealed no abnormalities. The time to cecum=2 minutes 26 seconds.  Withdrawal time=9 minutes 51 seconds.  The scope was withdrawn and the procedure completed. COMPLICATIONS: There were no immediate complications.  ENDOSCOPIC IMPRESSION: 1.   Five sessile polyps ranging between 3-4mm  in size were found in the descending colon, transverse colon, and ascending colon; polypectomies were performed with a cold snare 2.   The examination was otherwise normal  RECOMMENDATIONS: If the polyp(s) removed today are proven to be adenomatous (pre-cancerous) polyps, you will need a colonoscopy in 3-5 years. Otherwise you should continue to follow colorectal cancer screening guidelines for "routine risk" patients with a colonoscopy in 10 years.  You will receive a letter within 1-2 weeks with the results of your biopsy as well as final recommendations.  Please call my office if you have not received a letter after 3 weeks.  eSigned:  Milus Banister, MD 08/13/2014 1:54 PM   cc: Gwendolyn Grant, MD

## 2014-08-13 NOTE — Progress Notes (Signed)
Report to PACU, RN, vss, BBS= Clear.  

## 2014-08-13 NOTE — Progress Notes (Signed)
Called to room to assist during endoscopic procedure.  Patient ID and intended procedure confirmed with present staff. Received instructions for my participation in the procedure from the performing physician.  

## 2014-08-13 NOTE — Op Note (Signed)
[place]  [address]. [place], [id number]   ENDOSCOPY PROCEDURE REPORT  PATIENT: Sarah Valentine, Goding  MR#: [id number] BIRTHDATE: 01/29/1951 , 72  yrs. old GENDER: female ENDOSCOPIST: Milus Banister, MD PROCEDURE DATE:  08/13/2014 PROCEDURE:  EGD w/ biopsy ASA CLASS:     Class II INDICATIONS:  Barrett's change noted in biopsies [place] 2010; EGD 2012 Dr.  Ardis Hughs, irregular Z line, biopsies showed no IM. MEDICATIONS: Residual sedation present, Monitored anesthesia care, and Propofol 50 mg IV, lidocaine 100mg  IV TOPICAL ANESTHETIC: none  DESCRIPTION OF PROCEDURE: After the risks benefits and alternatives of the procedure were thoroughly explained, informed consent was obtained.  The LB [id number] endoscope was introduced through the mouth and advanced to the second portion of the duodenum , Without limitations.  The instrument was slowly withdrawn as the mucosa was fully examined.    The Z -line was slightly irregular but no nodules or inflammation was present.  This was biopsied, sent to pathology.  The examination was otherwise normal.  Retroflexed views revealed no abnormalities.     The scope was then withdrawn from the patient and the procedure completed.  COMPLICATIONS: There were no immediate complications.  ENDOSCOPIC IMPRESSION: The Z -line was slightly irregular but no nodules or inflammation was present.  This was biopsied, sent to pathology.  The examination was otherwise normal  RECOMMENDATIONS: Await final pathology.   eSigned:  Milus Banister, MD 08/13/2014 1:57 PM    CC: Gwendolyn Grant, MD

## 2014-08-14 ENCOUNTER — Telehealth: Payer: Self-pay

## 2014-08-14 NOTE — Telephone Encounter (Signed)
  Follow up Call-  Call back number 08/13/2014  Post procedure Call Back phone  # [phone] cell  Permission to leave phone message Yes     Patient questions:  Do you have a fever, pain , or abdominal swelling? No. Pain Score  0 *  Have you tolerated food without any problems? Yes.    Have you been able to return to your normal activities? Yes.    Do you have any questions about your discharge instructions: Diet   No. Medications  No. Follow up visit  No.  Do you have questions or concerns about your Care? No.  Actions: * If pain score is 4 or above: No action needed, pain <4.

## 2014-08-20 ENCOUNTER — Encounter: Payer: Self-pay | Admitting: Gastroenterology

## 2014-08-24 ENCOUNTER — Ambulatory Visit (INDEPENDENT_AMBULATORY_CARE_PROVIDER_SITE_OTHER): Payer: BC Managed Care – PPO

## 2014-08-24 DIAGNOSIS — Z23 Encounter for immunization: Secondary | ICD-10-CM

## 2014-09-17 ENCOUNTER — Other Ambulatory Visit: Payer: Self-pay | Admitting: Dermatology

## 2014-11-06 ENCOUNTER — Ambulatory Visit: Payer: BC Managed Care – PPO | Admitting: Internal Medicine

## 2014-11-14 ENCOUNTER — Encounter: Payer: Self-pay | Admitting: Internal Medicine

## 2014-11-14 ENCOUNTER — Ambulatory Visit (INDEPENDENT_AMBULATORY_CARE_PROVIDER_SITE_OTHER): Payer: BLUE CROSS/BLUE SHIELD | Admitting: Internal Medicine

## 2014-11-14 VITALS — BP 114/80 | HR 77 | Temp 97.7°F | Ht 63.5 in | Wt 146.2 lb

## 2014-11-14 DIAGNOSIS — Z23 Encounter for immunization: Secondary | ICD-10-CM

## 2014-11-14 DIAGNOSIS — N078 Hereditary nephropathy, not elsewhere classified with other morphologic lesions: Secondary | ICD-10-CM | POA: Insufficient documentation

## 2014-11-14 DIAGNOSIS — Z0000 Encounter for general adult medical examination without abnormal findings: Secondary | ICD-10-CM

## 2014-11-14 DIAGNOSIS — Z1239 Encounter for other screening for malignant neoplasm of breast: Secondary | ICD-10-CM

## 2014-11-14 NOTE — Assessment & Plan Note (Signed)
Check annual microalb/CR ratio to monitor given FH nephritis

## 2014-11-14 NOTE — Progress Notes (Signed)
Subjective:    Patient ID: Sarah Valentine, female    DOB: [birth year], 64 y.o.   MRN: [id number]  HPI  patient is here today for annual physical. Patient feels well and has no complaints.  Also reviewed chronic medical issues and interval medical events  Past Medical History  Diagnosis Date  . [place] 2010  . Hypertension   . Hx of adenomatous colonic polyps 12/2008  . Hyperlipidemia   . 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 GERD (gastroesophageal reflux disease)   . Migraines   . BCC (basal cell carcinoma), trunk 08/2013    removed from back ([place])  . Nephritis, hereditary     familial chronic nephritis   Family History  Problem Relation Age of Onset  . Breast cancer Mother 36  . Arthritis Mother   . Hyperlipidemia Mother   . Heart disease Mother   . Hypertension Mother   . Kidney disease Mother   . Diabetes Mother   . Arthritis Father   . Esophageal cancer Father 22  . Colon cancer Maternal Grandmother 78  . Arthritis Maternal Grandmother   . Hypertension Maternal Grandmother   . Kidney disease Maternal Grandmother   . Breast cancer Maternal Grandmother   . Diabetes Maternal Grandmother   . Breast cancer Maternal Aunt     x 2 aunt  . Arthritis Other   . Kidney disease Other     [place] nephritis -pt states mostly maternal side of family as the disease  . Esophageal cancer Paternal Uncle   . Rectal cancer Neg Hx   . Stomach cancer Neg Hx   . Pancreatic cancer Neg Hx    History  Substance Use Topics  . Smoking status: Never Smoker   . 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 Alcohol Use: No    Review of Systems  Constitutional: Negative for fatigue and unexpected weight change.  Respiratory: Negative for cough, shortness of breath and wheezing.   Cardiovascular: Negative for chest pain, palpitations and leg swelling.    Gastrointestinal: Negative for nausea, abdominal pain and diarrhea.  Neurological: Negative for dizziness, weakness, light-headedness and headaches.  Psychiatric/Behavioral: Negative for dysphoric mood. The patient is not nervous/anxious.   All other systems reviewed and are negative.      Objective:   Physical Exam  BP 114/80 mmHg  Pulse 77  Temp(Src) 97.7 F (36.5 C) (Oral)  Ht 5' 3.5" (1.613 m)  Wt 146 lb 4 oz (66.339 kg)  BMI 25.50 kg/m2  SpO2 94% Wt Readings from Last 3 Encounters:  11/14/14 146 lb 4 oz (66.339 kg)  07/30/14 147 lb 12.8 oz (67.042 kg)  05/25/14 146 lb 6.4 oz (66.407 kg)   Constitutional: She appears well-developed and well-nourished. No distress.  HENT: Head: Normocephalic and atraumatic. Ears: B TMs ok, no erythema or effusion; Nose: Nose normal. Mouth/Throat: Oropharynx is clear and moist. No oropharyngeal exudate.  Eyes: Conjunctivae and EOM are normal. Pupils are equal, round, and reactive to light. No scleral icterus.  Neck: Normal range of motion. Neck supple. No JVD present. No thyromegaly present.  Cardiovascular: Normal rate, regular rhythm and normal heart sounds.  No murmur heard. No BLE edema. Pulmonary/Chest: Effort normal and breath sounds normal. No respiratory distress. She has no wheezes.  Abdominal: Soft. Bowel sounds are normal. She exhibits no distension. There  is no tenderness. no masses GU/breast: defer to gyn Musculoskeletal: Normal range of motion, no joint effusions. No gross deformities Neurological: She is alert and oriented to person, place, and time. No cranial nerve deficit. Coordination, balance, strength, speech and gait are normal.  Skin: Skin is warm and dry. No rash noted. No erythema.  Psychiatric: She has a normal mood and affect. Her behavior is normal. Judgment and thought content normal.    Lab Results  Component Value Date   WBC 7.3 10/20/2013   HGB 12.5 10/20/2013   HCT 38.1 10/20/2013   PLT 294.0 10/20/2013    GLUCOSE 96 10/20/2013   CHOL 243* 10/20/2013   TRIG 157.0* 10/20/2013   HDL 50.70 10/20/2013   LDLDIRECT 171.3 10/20/2013   ALT 25 10/20/2013   AST 24 10/20/2013   NA 140 10/20/2013   K 4.2 10/20/2013   CL 107 10/20/2013   CREATININE 1.3* 10/20/2013   BUN 26* 10/20/2013   CO2 27 10/20/2013   TSH 1.27 10/20/2013   MICROALBUR 120.7 Repeated and verified X2.* 10/20/2013    Mr Breast Bilateral W Wo Contrast  12/11/2013   CLINICAL DATA:  High risk screening evaluation. Family history of breast cancer in mother, 2 maternal aunts and 2 cousins. Dense breast parenchyma on mammography.  EXAM: BILATERAL BREAST MRI WITH AND WITHOUT CONTRAST  LABS:  BUN and creatinine were obtained on site at [facility] at [address]Results: BUN 19 mg/dL, Creatinine 1.3 mg/dL. GFR of 42. Due to the patient's GFR, half dose of contrast was utilized.  TECHNIQUE: Multiplanar, multisequence MR images of both breasts were obtained prior to and following the intravenous administration of 46ml of MultiHance  THREE-DIMENSIONAL MR IMAGE RENDERING ON INDEPENDENT WORKSTATION:  Three-dimensional MR images were rendered by post-processing of the original MR data on an independent workstation. The three-dimensional MR images were interpreted, and findings are reported in the following complete MRI report for this study. Three dimensional images were evaluated at the independent DynaCad workstation  COMPARISON:  Previous exams  FINDINGS: Breast composition: c:  Heterogeneous fibroglandular tissue  Background parenchymal enhancement: Mild  Right breast: No mass or abnormal enhancement.  Left breast: No mass or abnormal enhancement.  Lymph nodes: No abnormal appearing lymph nodes.  Ancillary findings:  No additional findings.  IMPRESSION: No findings worrisome for malignancy. Recommend screening mammography in 1 year.  RECOMMENDATION: Bilateral screening mammography in 1 year.  BI-RADS CATEGORY  1: Negative.   Electronically  Signed   By: Luberta Robertson M.D.   On: 12/11/2013 14:44       Assessment & Plan:   CPX/z00.00 - Patient has been counseled on age-appropriate routine health concerns for screening and prevention. These are reviewed and up-to-date. Immunizations are up-to-date or declined. Labs ordered  and reviewed.  Problem List Items Addressed This Visit    Nephritis, hereditary    Check annual microalb/CR ratio to monitor given FH nephritis      Relevant Orders   Microalbumin / creatinine urine ratio    Other Visit Diagnoses    Routine general medical examination at a health care facility    -  Primary    Relevant Orders    Basic metabolic panel    CBC with Differential    Hepatic function panel    Lipid panel    TSH    Urinalysis, Routine w reflex microscopic    Screening breast examination        Relevant Orders    MM DIGITAL  SCREENING BILATERAL

## 2014-11-14 NOTE — Progress Notes (Signed)
Pre visit review using our clinic review tool, if applicable. No additional management support is needed unless otherwise documented below in the visit note. 

## 2014-11-14 NOTE — Patient Instructions (Addendum)
It was good to see you today.  We have reviewed your prior records including labs and tests today  We will make referral for screening mammography as discussed today. We'll plan breast MRI in 2017 -or sooner if needed/problems  Prevnar vaccine updated today   Other Health Maintenance reviewed - all other recommended immunizations and age-appropriate screenings are up-to-date.  Test(s) ordered today. Your results will be released to [place] (or called to you) after review, usually within 72hours after test completion. If any changes need to be made, you will be notified at that same time.  Medications reviewed and updated, no changes recommended at this time.  Please schedule followup in 12 months for annual exam and labs, call sooner if problems.  Health Maintenance Adopting a healthy lifestyle and getting preventive care can go a long way to promote health and wellness. Talk with your health care provider about what schedule of regular examinations is right for you. This is a good chance for you to check in with your provider about disease prevention and staying healthy. In between checkups, there are plenty of things you can do on your own. Experts have done a lot of research about which lifestyle changes and preventive measures are most likely to keep you healthy. Ask your health care provider for more information. WEIGHT AND DIET  Eat a healthy diet  Be sure to include plenty of vegetables, fruits, low-fat dairy products, and lean protein.  Do not eat a lot of foods high in solid fats, added sugars, or salt.  Get regular exercise. This is one of the most important things you can do for your health.  Most adults should exercise for at least 150 minutes each week. The exercise should increase your heart rate and make you sweat (moderate-intensity exercise).  Most adults should also do strengthening exercises at least twice a week. This is in addition to the moderate-intensity  exercise.  Maintain a healthy weight  Body mass index (BMI) is a measurement that can be used to identify possible weight problems. It estimates body fat based on height and weight. Your health care provider can help determine your BMI and help you achieve or maintain a healthy weight.  For females 47 years of age and older:   A BMI below 18.5 is considered underweight.  A BMI of 18.5 to 24.9 is normal.  A BMI of 25 to 29.9 is considered overweight.  A BMI of 30 and above is considered obese.  Watch levels of cholesterol and blood lipids  You should start having your blood tested for lipids and cholesterol at 63 years of age, then have this test every 5 years.  You may need to have your cholesterol levels checked more often if:  Your lipid or cholesterol levels are high.  You are older than 63 years of age.  You are at high risk for heart disease.  CANCER SCREENING   Lung Cancer  Lung cancer screening is recommended for adults 45-10 years old who are at high risk for lung cancer because of a history of smoking.  A yearly low-dose CT scan of the lungs is recommended for people who:  Currently smoke.  Have quit within the past 15 years.  Have at least a 30-pack-year history of smoking. A pack year is smoking an average of one pack of cigarettes a day for 1 year.  Yearly screening should continue until it has been 15 years since you quit.  Yearly screening should stop if you  develop a health problem that would prevent you from having lung cancer treatment.  Breast Cancer  Practice breast self-awareness. This means understanding how your breasts normally appear and feel.  It also means doing regular breast self-exams. Let your health care provider know about any changes, no matter how small.  If you are in your 20s or 30s, you should have a clinical breast exam (CBE) by a health care provider every 1-3 years as part of a regular health exam.  If you are 71 or  older, have a CBE every year. Also consider having a breast X-ray (mammogram) every year.  If you have a family history of breast cancer, talk to your health care provider about genetic screening.  If you are at high risk for breast cancer, talk to your health care provider about having an MRI and a mammogram every year.  Breast cancer gene (BRCA) assessment is recommended for women who have family members with BRCA-related cancers. BRCA-related cancers include:  Breast.  Ovarian.  Tubal.  Peritoneal cancers.  Results of the assessment will determine the need for genetic counseling and BRCA1 and BRCA2 testing. Cervical Cancer Routine pelvic examinations to screen for cervical cancer are no longer recommended for nonpregnant women who are considered low risk for cancer of the pelvic organs (ovaries, uterus, and vagina) and who do not have symptoms. A pelvic examination may be necessary if you have symptoms including those associated with pelvic infections. Ask your health care provider if a screening pelvic exam is right for you.   The Pap test is the screening test for cervical cancer for women who are considered at risk.  If you had a hysterectomy for a problem that was not cancer or a condition that could lead to cancer, then you no longer need Pap tests.  If you are older than 65 years, and you have had normal Pap tests for the past 10 years, you no longer need to have Pap tests.  If you have had past treatment for cervical cancer or a condition that could lead to cancer, you need Pap tests and screening for cancer for at least 20 years after your treatment.  If you no longer get a Pap test, assess your risk factors if they change (such as having a new sexual partner). This can affect whether you should start being screened again.  Some women have medical problems that increase their chance of getting cervical cancer. If this is the case for you, your health care provider may  recommend more frequent screening and Pap tests.  The human papillomavirus (HPV) test is another test that may be used for cervical cancer screening. The HPV test looks for the virus that can cause cell changes in the cervix. The cells collected during the Pap test can be tested for HPV.  The HPV test can be used to screen women 39 years of age and older. Getting tested for HPV can extend the interval between normal Pap tests from three to five years.  An HPV test also should be used to screen women of any age who have unclear Pap test results.  After 63 years of age, women should have HPV testing as often as Pap tests.  Colorectal Cancer  This type of cancer can be detected and often prevented.  Routine colorectal cancer screening usually begins at 63 years of age and continues through 63 years of age.  Your health care provider may recommend screening at an earlier age if you have  risk factors for colon cancer.  Your health care provider may also recommend using home test kits to check for hidden blood in the stool.  A small camera at the end of a tube can be used to examine your colon directly (sigmoidoscopy or colonoscopy). This is done to check for the earliest forms of colorectal cancer.  Routine screening usually begins at age 59.  Direct examination of the colon should be repeated every 5-10 years through 63 years of age. However, you may need to be screened more often if early forms of precancerous polyps or small growths are found. Skin Cancer  Check your skin from head to toe regularly.  Tell your health care provider about any new moles or changes in moles, especially if there is a change in a mole's shape or color.  Also tell your health care provider if you have a mole that is larger than the size of a pencil eraser.  Always use sunscreen. Apply sunscreen liberally and repeatedly throughout the day.  Protect yourself by wearing long sleeves, pants, a wide-brimmed hat,  and sunglasses whenever you are outside. HEART DISEASE, DIABETES, AND HIGH BLOOD PRESSURE   Have your blood pressure checked at least every 1-2 years. High blood pressure causes heart disease and increases the risk of stroke.  If you are between 32 years and 42 years old, ask your health care provider if you should take aspirin to prevent strokes.  Have regular diabetes screenings. This involves taking a blood sample to check your fasting blood sugar level.  If you are at a normal weight and have a low risk for diabetes, have this test once every three years after 63 years of age.  If you are overweight and have a high risk for diabetes, consider being tested at a younger age or more often. PREVENTING INFECTION  Hepatitis B  If you have a higher risk for hepatitis B, you should be screened for this virus. You are considered at high risk for hepatitis B if:  You were born in a country where hepatitis B is common. Ask your health care provider which countries are considered high risk.  Your parents were born in a high-risk country, and you have not been immunized against hepatitis B (hepatitis B vaccine).  You have HIV or AIDS.  You use needles to inject street drugs.  You live with someone who has hepatitis B.  You have had sex with someone who has hepatitis B.  You get hemodialysis treatment.  You take certain medicines for conditions, including cancer, organ transplantation, and autoimmune conditions. Hepatitis C  Blood testing is recommended for:  Everyone born from [birth year] through 1964.  Anyone with known risk factors for hepatitis C. Sexually transmitted infections (STIs)  You should be screened for sexually transmitted infections (STIs) including gonorrhea and chlamydia if:  You are sexually active and are younger than 63 years of age.  You are older than 63 years of age and your health care provider tells you that you are at risk for this type of infection.  Your  sexual activity has changed since you were last screened and you are at an increased risk for chlamydia or gonorrhea. Ask your health care provider if you are at risk.  If you do not have HIV, but are at risk, it may be recommended that you take a prescription medicine daily to prevent HIV infection. This is called pre-exposure prophylaxis (PrEP). You are considered at risk if:  You are sexually  active and do not regularly use condoms or know the HIV status of your partner(s).  You take drugs by injection.  You are sexually active with a partner who has HIV. Talk with your health care provider about whether you are at high risk of being infected with HIV. If you choose to begin PrEP, you should first be tested for HIV. You should then be tested every 3 months for as long as you are taking PrEP.  PREGNANCY   If you are premenopausal and you may become pregnant, ask your health care provider about preconception counseling.  If you may become pregnant, take 400 to 800 micrograms (mcg) of folic acid every day.  If you want to prevent pregnancy, talk to your health care provider about birth control (contraception). OSTEOPOROSIS AND MENOPAUSE   Osteoporosis is a disease in which the bones lose minerals and strength with aging. This can result in serious bone fractures. Your risk for osteoporosis can be identified using a bone density scan.  If you are 56 years of age or older, or if you are at risk for osteoporosis and fractures, ask your health care provider if you should be screened.  Ask your health care provider whether you should take a calcium or vitamin D supplement to lower your risk for osteoporosis.  Menopause may have certain physical symptoms and risks.  Hormone replacement therapy may reduce some of these symptoms and risks. Talk to your health care provider about whether hormone replacement therapy is right for you.  HOME CARE INSTRUCTIONS   Schedule regular health, dental, and  eye exams.  Stay current with your immunizations.   Do not use any tobacco products including cigarettes, chewing tobacco, or electronic cigarettes.  If you are pregnant, do not drink alcohol.  If you are breastfeeding, limit how much and how often you drink alcohol.  Limit alcohol intake to no more than 1 drink per day for nonpregnant women. One drink equals 12 ounces of beer, 5 ounces of wine, or 1 ounces of hard liquor.  Do not use street drugs.  Do not share needles.  Ask your health care provider for help if you need support or information about quitting drugs.  Tell your health care provider if you often feel depressed.  Tell your health care provider if you have ever been abused or do not feel safe at home. Document Released: 05/04/2011 Document Revised: 03/05/2014 Document Reviewed: 09/20/2013 [facility] Patient Information 2015 [place], [place]. This information is not intended to replace advice given to you by your health care provider. Make sure you discuss any questions you have with your health care provider.

## 2014-11-18 ENCOUNTER — Other Ambulatory Visit: Payer: Self-pay | Admitting: Internal Medicine

## 2014-12-04 ENCOUNTER — Telehealth: Payer: Self-pay

## 2014-12-04 NOTE — Telephone Encounter (Signed)
PA for benicar started via cover my meds.

## 2014-12-07 ENCOUNTER — Ambulatory Visit
Admission: RE | Admit: 2014-12-07 | Discharge: 2014-12-07 | Disposition: A | Discharge status: Home / Self Care | Payer: BLUE CROSS/BLUE SHIELD | Source: Ambulatory Visit | Attending: Internal Medicine | Admitting: Internal Medicine

## 2014-12-07 DIAGNOSIS — Z1239 Encounter for other screening for malignant neoplasm of breast: Secondary | ICD-10-CM

## 2014-12-12 NOTE — Telephone Encounter (Signed)
PA for benicar is not required.   Called [facility] to verify that a PA is not required.  [facility] confirmed that Benicar is covered and has been shipped.

## 2014-12-24 ENCOUNTER — Emergency Department (HOSPITAL_BASED_OUTPATIENT_CLINIC_OR_DEPARTMENT_OTHER)
Admission: EM | Admit: 2014-12-24 | Discharge: 2014-12-24 | Disposition: A | Discharge status: Home / Self Care | Payer: BLUE CROSS/BLUE SHIELD | Attending: Emergency Medicine | Admitting: Emergency Medicine

## 2014-12-24 ENCOUNTER — Encounter (HOSPITAL_BASED_OUTPATIENT_CLINIC_OR_DEPARTMENT_OTHER): Payer: Self-pay | Admitting: *Deleted

## 2014-12-24 ENCOUNTER — Emergency Department (HOSPITAL_BASED_OUTPATIENT_CLINIC_OR_DEPARTMENT_OTHER): Payer: BLUE CROSS/BLUE SHIELD

## 2014-12-24 DIAGNOSIS — Z85828 Personal history of other malignant neoplasm of skin: Secondary | ICD-10-CM | POA: Insufficient documentation

## 2014-12-24 DIAGNOSIS — Z85820 Personal history of malignant melanoma of skin: Secondary | ICD-10-CM | POA: Diagnosis not present

## 2014-12-24 DIAGNOSIS — Z87448 Personal history of other diseases of urinary system: Secondary | ICD-10-CM | POA: Diagnosis not present

## 2014-12-24 DIAGNOSIS — I10 Essential (primary) hypertension: Secondary | ICD-10-CM | POA: Diagnosis not present

## 2014-12-24 DIAGNOSIS — K219 Gastro-esophageal reflux disease without esophagitis: Secondary | ICD-10-CM | POA: Diagnosis not present

## 2014-12-24 DIAGNOSIS — R112 Nausea with vomiting, unspecified: Secondary | ICD-10-CM | POA: Insufficient documentation

## 2014-12-24 DIAGNOSIS — R1030 Lower abdominal pain, unspecified: Secondary | ICD-10-CM | POA: Diagnosis present

## 2014-12-24 DIAGNOSIS — R140 Abdominal distension (gaseous): Secondary | ICD-10-CM | POA: Diagnosis not present

## 2014-12-24 DIAGNOSIS — R1084 Generalized abdominal pain: Secondary | ICD-10-CM | POA: Diagnosis not present

## 2014-12-24 DIAGNOSIS — Z79899 Other long term (current) drug therapy: Secondary | ICD-10-CM | POA: Insufficient documentation

## 2014-12-24 DIAGNOSIS — Z86010 Personal history of colonic polyps: Secondary | ICD-10-CM | POA: Diagnosis not present

## 2014-12-24 LAB — CBC WITH DIFFERENTIAL/PLATELET
BASOS ABS: 0 10*3/uL (ref 0.0–0.1)
BASOS PCT: 0 % (ref 0–1)
EOS PCT: 0 % (ref 0–5)
Eosinophils Absolute: 0 10*3/uL (ref 0.0–0.7)
HCT: 40.1 % (ref 36.0–46.0)
HEMOGLOBIN: 13.1 g/dL (ref 12.0–15.0)
LYMPHS ABS: 0.6 10*3/uL — AB (ref 0.7–4.0)
Lymphocytes Relative: 4 % — ABNORMAL LOW (ref 12–46)
MCH: 27.8 pg (ref 26.0–34.0)
MCHC: 32.7 g/dL (ref 30.0–36.0)
MCV: 85 fL (ref 78.0–100.0)
MONO ABS: 0.3 10*3/uL (ref 0.1–1.0)
Monocytes Relative: 2 % — ABNORMAL LOW (ref 3–12)
NEUTROS ABS: 12.4 10*3/uL — AB (ref 1.7–7.7)
NEUTROS PCT: 94 % — AB (ref 43–77)
Platelets: 266 10*3/uL (ref 150–400)
RBC: 4.72 MIL/uL (ref 3.87–5.11)
RDW: 13.9 % (ref 11.5–15.5)
WBC: 13.3 10*3/uL — AB (ref 4.0–10.5)

## 2014-12-24 LAB — URINALYSIS, ROUTINE W REFLEX MICROSCOPIC
Bilirubin Urine: NEGATIVE
Glucose, UA: NEGATIVE mg/dL
Ketones, ur: NEGATIVE mg/dL
Leukocytes, UA: NEGATIVE
NITRITE: NEGATIVE
PH: 6 (ref 5.0–8.0)
Protein, ur: >300 mg/dL — AB
Specific Gravity, Urine: 1.018 (ref 1.005–1.030)
UROBILINOGEN UA: 0.2 mg/dL (ref 0.0–1.0)

## 2014-12-24 LAB — COMPREHENSIVE METABOLIC PANEL
ALT: 27 U/L (ref 0–35)
AST: 37 U/L (ref 0–37)
Albumin: 3.6 g/dL (ref 3.5–5.2)
Alkaline Phosphatase: 126 U/L — ABNORMAL HIGH (ref 39–117)
Anion gap: 2 — ABNORMAL LOW (ref 5–15)
BILIRUBIN TOTAL: 0.4 mg/dL (ref 0.3–1.2)
BUN: 26 mg/dL — ABNORMAL HIGH (ref 6–23)
CO2: 21 mmol/L (ref 19–32)
Calcium: 8.4 mg/dL (ref 8.4–10.5)
Chloride: 114 mmol/L — ABNORMAL HIGH (ref 96–112)
Creatinine, Ser: 1.34 mg/dL — ABNORMAL HIGH (ref 0.50–1.10)
GFR calc Af Amer: 48 mL/min — ABNORMAL LOW (ref >90)
GFR, EST NON AFRICAN AMERICAN: 41 mL/min — AB (ref >90)
Glucose, Bld: 136 mg/dL — ABNORMAL HIGH (ref 70–99)
POTASSIUM: 3.8 mmol/L (ref 3.5–5.1)
SODIUM: 137 mmol/L (ref 135–145)
Total Protein: 7.3 g/dL (ref 6.0–8.3)

## 2014-12-24 LAB — LIPASE, BLOOD: Lipase: 32 U/L (ref 11–59)

## 2014-12-24 LAB — URINE MICROSCOPIC-ADD ON

## 2014-12-24 MED ORDER — IOHEXOL 300 MG/ML  SOLN
50.0000 mL | Freq: Once | INTRAMUSCULAR | Status: AC | PRN
  Administered 2014-12-24: 50 mL via ORAL

## 2014-12-24 MED ORDER — FENTANYL CITRATE 0.05 MG/ML IJ SOLN
50.0000 ug | Freq: Once | INTRAMUSCULAR | Status: AC
  Administered 2014-12-24: 50 ug via INTRAVENOUS

## 2014-12-24 MED ORDER — ONDANSETRON 4 MG PO TBDP: 4.0000 mg | ORAL_TABLET | Freq: Three times a day (TID) | ORAL | Status: DC | PRN

## 2014-12-24 MED ORDER — ONDANSETRON HCL 4 MG/2ML IJ SOLN
4.0000 mg | Freq: Once | INTRAMUSCULAR | Status: AC
  Administered 2014-12-24: 4 mg via INTRAVENOUS
  Filled 2014-12-24: qty 2

## 2014-12-24 MED ORDER — FENTANYL CITRATE 0.05 MG/ML IJ SOLN
50.0000 ug | Freq: Once | INTRAMUSCULAR | Status: AC
  Administered 2014-12-24: 100 ug via INTRAVENOUS
  Filled 2014-12-24: qty 2

## 2014-12-24 MED ORDER — KETOROLAC TROMETHAMINE 30 MG/ML IJ SOLN: 30.0000 mg | Freq: Once | INTRAMUSCULAR | Status: DC

## 2014-12-24 MED ORDER — SODIUM CHLORIDE 0.9 % IV SOLN
Freq: Once | INTRAVENOUS | Status: AC
  Administered 2014-12-24: 05:00:00 via INTRAVENOUS

## 2014-12-24 MED ORDER — HYDROCODONE-ACETAMINOPHEN 5-325 MG PO TABS: 1.0000 | ORAL_TABLET | ORAL | Status: DC | PRN

## 2014-12-24 NOTE — ED Notes (Signed)
Pt up to b/r, now to CT via stretcher. No changes.

## 2014-12-24 NOTE — ED Notes (Signed)
C/o abd pain and bloating, diffuse 9/10 abd pain, onset 2100, also nv, emesis x3, (denies: diarrhea, fever, constipation, dizziness, sob or other sx), mentions hereditary kidney disease, "alsways has blood and protein in urine",  no meds PTA, last BM this evening (normal), h/o peritonitis after abscess on kidney ruptured. Alert, NAD, calm, interactive, steady gait.

## 2014-12-24 NOTE — ED Provider Notes (Signed)
TIME SEEN: 4:30 AM  CHIEF COMPLAINT: Abdominal pain, vomiting  HPI: Pt is a 64 y.o. 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 No diarrhea. No bloody stool or melena. No dysuria or hematuria. No vaginal bleeding or discharge. She is status post hysterectomy and cholecystectomy. Has never had similar symptoms. Denies recent travel. States her granddaughter has had recent similar symptoms of vomiting.  ROS: See HPI Constitutional: no fever  Eyes: no drainage  ENT: no runny nose   Cardiovascular:  no chest pain  Resp: no SOB  GI:  vomiting GU: no dysuria Integumentary: no rash  Allergy: no hives  Musculoskeletal: no leg swelling  Neurological: no slurred speech ROS otherwise negative  PAST MEDICAL HISTORY/PAST SURGICAL HISTORY:  Past Medical History  Diagnosis Date  . [place] 2010  . Hypertension   . Hx of adenomatous colonic polyps 12/2008  . Hyperlipidemia   . 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 GERD (gastroesophageal reflux disease)   . Migraines   . BCC (basal cell carcinoma), trunk 08/2013    removed from back ([place])  . Nephritis, hereditary     familial chronic nephritis  . 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 Authorizing Provider  Cranberry 1000 MG CAPS Take 500 mg by mouth daily.     Historical Provider, MD  olmesartan (BENICAR) 20 MG tablet Take 0.5 tablets (10 mg total) by mouth daily. 11/19/14   Rowe Clack, MD  omeprazole (PRILOSEC) 40 MG capsule Take 1 capsule (40 mg total) by mouth daily. 11/19/14   Rowe Clack, MD    ALLERGIES:  Allergies  Allergen Reactions  .  Sulfonamide Derivatives Itching and Rash    SOCIAL HISTORY:  History  Substance Use Topics  . Smoking status: Never Smoker   . Smokeless tobacco: Never Used  . Alcohol Use: No    FAMILY HISTORY: Family History  Problem Relation Age of Onset  . Breast cancer Mother 95  . Arthritis Mother   . Hyperlipidemia Mother   . Heart disease Mother   . Hypertension Mother   . Kidney disease Mother   . Diabetes Mother   . Arthritis Father   . Esophageal cancer Father 31  . Colon cancer Maternal Grandmother 78  . Arthritis Maternal Grandmother   . Hypertension Maternal Grandmother   . Kidney disease Maternal Grandmother   . Breast cancer Maternal Grandmother   . Diabetes Maternal Grandmother   . Breast cancer Maternal Aunt     x 2 aunt  . Arthritis Other   . Kidney disease Other     [place] nephritis -pt states mostly maternal side of family as the disease  . Esophageal cancer Paternal Uncle   . Rectal cancer Neg Hx   . Stomach cancer Neg Hx   . Pancreatic cancer Neg Hx     EXAM: BP 116/103 mmHg  Pulse 111  Temp(Src) 98.6 F (37 C) (Oral)  Resp 24  Ht 5' 3.5" (1.613 m)  Wt 144 lb (65.318 kg)  BMI 25.11 kg/m2  SpO2 99% CONSTITUTIONAL: Alert and oriented and responds appropriately to questions. Well-appearing; well-nourished, smiling and nontoxic  appearing but does appear uncomfortable and is rocking back and forth in the bed HEAD: Normocephalic EYES: Conjunctivae clear, PERRL ENT: normal nose; no rhinorrhea; moist mucous membranes; pharynx without lesions noted NECK: Supple, no meningismus, no LAD  CARD: Regular and tachycardic; S1 and S2 appreciated; no murmurs, no clicks, no rubs, no gallops RESP: Normal chest excursion without splinting or tachypnea; breath sounds clear and equal bilaterally; no wheezes, no rhonchi, no rales,  ABD/GI: Normal bowel sounds; non-distended; soft, diffusely tender with throughout the lower abdomen with mild voluntary guarding, no rebound BACK:   The back appears normal and is non-tender to palpation, there is no CVA tenderness EXT: Normal ROM in all joints; non-tender to palpation; no edema; normal capillary refill; no cyanosis    SKIN: Normal color for age and race; warm NEURO: Moves all extremities equally PSYCH: The patient's mood and manner are appropriate. Grooming and personal hygiene are appropriate.  MEDICAL DECISION MAKING: Patient here with diffuse lower abdominal pain and vomiting. We'll obtain labs, urinalysis and CT scan for further evaluation. Patient has had a sick contact with recent similar symptoms and discussed with patient that this may be viral in nature but given how tender she is in her lower abdomen would like to proceed with CT scan she agrees. We'll give oral but not IV contrast given her history of nephritis as patient reports she has a low GFR. We'll give pain and nausea medicine, IV fluids.  ED PROGRESS: Labs show leukocytosis with left shift. Urine shows hematuria and proteinuria which patient reports is chronic.  Creatinine is also stable. Patient reports feeling much better. CT scan pending.   7:00 AM  Pt's CT scan does not appear to show any acute abnormality.  If CT read is negative, will dc home.  I cleared viral illness. Will discharge with Zofran, Vicodin. Discussed return precautions. She verbalized understanding and is comfortable with plan.  [place], DO 12/24/14 [phone]

## 2014-12-24 NOTE — Discharge Instructions (Signed)

## 2014-12-24 NOTE — ED Notes (Signed)
"  feel better", tolerating contrast, NAD, calm, VSS, husband at [facility].

## 2015-07-04 ENCOUNTER — Other Ambulatory Visit (INDEPENDENT_AMBULATORY_CARE_PROVIDER_SITE_OTHER): Payer: BLUE CROSS/BLUE SHIELD

## 2015-07-04 DIAGNOSIS — N078 Hereditary nephropathy, not elsewhere classified with other morphologic lesions: Secondary | ICD-10-CM | POA: Diagnosis not present

## 2015-07-04 DIAGNOSIS — Z0000 Encounter for general adult medical examination without abnormal findings: Secondary | ICD-10-CM | POA: Diagnosis not present

## 2015-07-04 LAB — LIPID PANEL
Cholesterol: 213 mg/dL — ABNORMAL HIGH (ref 0–200)
HDL: 49.4 mg/dL (ref >39.00)
LDL Cholesterol: 138 mg/dL — ABNORMAL HIGH (ref 0–99)
NONHDL: 163.47
TRIGLYCERIDES: 125 mg/dL (ref 0.0–149.0)
Total CHOL/HDL Ratio: 4
VLDL: 25 mg/dL (ref 0.0–40.0)

## 2015-07-04 LAB — URINALYSIS, ROUTINE W REFLEX MICROSCOPIC
BILIRUBIN URINE: NEGATIVE
Ketones, ur: NEGATIVE
Leukocytes, UA: NEGATIVE
Nitrite: NEGATIVE
PH: 6 (ref 5.0–8.0)
Specific Gravity, Urine: <=1.005 — AB (ref 1.000–1.030)
Total Protein, Urine: 100 — AB
UROBILINOGEN UA: 0.2 (ref 0.0–1.0)
Urine Glucose: NEGATIVE

## 2015-07-04 LAB — HEPATIC FUNCTION PANEL
ALBUMIN: 3.6 g/dL (ref 3.5–5.2)
ALK PHOS: 108 U/L (ref 39–117)
ALT: 15 U/L (ref 0–35)
AST: 22 U/L (ref 0–37)
Bilirubin, Direct: 0.1 mg/dL (ref 0.0–0.3)
Total Bilirubin: 0.3 mg/dL (ref 0.2–1.2)
Total Protein: 6.4 g/dL (ref 6.0–8.3)

## 2015-07-04 LAB — CBC WITH DIFFERENTIAL/PLATELET
Basophils Absolute: 0 10*3/uL (ref 0.0–0.1)
Basophils Relative: 0.6 % (ref 0.0–3.0)
EOS ABS: 0.1 10*3/uL (ref 0.0–0.7)
EOS PCT: 1.7 % (ref 0.0–5.0)
HCT: 37.2 % (ref 36.0–46.0)
Hemoglobin: 12.3 g/dL (ref 12.0–15.0)
LYMPHS ABS: 2 10*3/uL (ref 0.7–4.0)
LYMPHS PCT: 31.3 % (ref 12.0–46.0)
MCHC: 33.1 g/dL (ref 30.0–36.0)
MCV: 84.8 fl (ref 78.0–100.0)
MONOS PCT: 7.6 % (ref 3.0–12.0)
Monocytes Absolute: 0.5 10*3/uL (ref 0.1–1.0)
NEUTROS PCT: 58.8 % (ref 43.0–77.0)
Neutro Abs: 3.7 10*3/uL (ref 1.4–7.7)
PLATELETS: 278 10*3/uL (ref 150.0–400.0)
RBC: 4.38 Mil/uL (ref 3.87–5.11)
RDW: 14 % (ref 11.5–15.5)
WBC: 6.4 10*3/uL (ref 4.0–10.5)

## 2015-07-04 LAB — MICROALBUMIN / CREATININE URINE RATIO
CREATININE, U: 42.6 mg/dL
MICROALB UR: 65.4 mg/dL — AB (ref 0.0–1.9)
MICROALB/CREAT RATIO: 153.4 mg/g — AB (ref 0.0–30.0)

## 2015-07-04 LAB — BASIC METABOLIC PANEL
BUN: 23 mg/dL (ref 6–23)
CHLORIDE: 106 meq/L (ref 96–112)
CO2: 27 mEq/L (ref 19–32)
Calcium: 9.2 mg/dL (ref 8.4–10.5)
Creatinine, Ser: 1.31 mg/dL — ABNORMAL HIGH (ref 0.40–1.20)
GFR: 43.59 mL/min — AB (ref >60.00)
Glucose, Bld: 99 mg/dL (ref 70–99)
Potassium: 4.3 mEq/L (ref 3.5–5.1)
SODIUM: 141 meq/L (ref 135–145)

## 2015-07-04 LAB — TSH: TSH: 1.42 u[IU]/mL (ref 0.35–4.50)

## 2015-07-09 ENCOUNTER — Other Ambulatory Visit: Payer: Self-pay | Admitting: Internal Medicine

## 2015-11-25 ENCOUNTER — Ambulatory Visit (INDEPENDENT_AMBULATORY_CARE_PROVIDER_SITE_OTHER): Payer: BLUE CROSS/BLUE SHIELD | Admitting: Internal Medicine

## 2015-11-25 ENCOUNTER — Encounter: Payer: Self-pay | Admitting: Internal Medicine

## 2015-11-25 VITALS — BP 116/82 | HR 76 | Temp 98.2°F | Resp 16 | Ht 63.5 in | Wt 142.0 lb

## 2015-11-25 DIAGNOSIS — K2100 Gastro-esophageal reflux disease with esophagitis, without bleeding: Secondary | ICD-10-CM

## 2015-11-25 DIAGNOSIS — E785 Hyperlipidemia, unspecified: Secondary | ICD-10-CM

## 2015-11-25 DIAGNOSIS — R739 Hyperglycemia, unspecified: Secondary | ICD-10-CM | POA: Diagnosis not present

## 2015-11-25 DIAGNOSIS — Z803 Family history of malignant neoplasm of breast: Secondary | ICD-10-CM

## 2015-11-25 DIAGNOSIS — G43809 Other migraine, not intractable, without status migrainosus: Secondary | ICD-10-CM

## 2015-11-25 DIAGNOSIS — I10 Essential (primary) hypertension: Secondary | ICD-10-CM | POA: Diagnosis not present

## 2015-11-25 DIAGNOSIS — C4359 Malignant melanoma of other part of trunk: Secondary | ICD-10-CM

## 2015-11-25 DIAGNOSIS — N078 Hereditary nephropathy, not elsewhere classified with other morphologic lesions: Secondary | ICD-10-CM

## 2015-11-25 DIAGNOSIS — Z1231 Encounter for screening mammogram for malignant neoplasm of breast: Secondary | ICD-10-CM

## 2015-11-25 DIAGNOSIS — K2270 Barrett's esophagus without dysplasia: Secondary | ICD-10-CM

## 2015-11-25 HISTORY — DX: Family history of malignant neoplasm of breast: Z80.3

## 2015-11-25 MED ORDER — OLMESARTAN MEDOXOMIL 20 MG PO TABS: 10.0000 mg | ORAL_TABLET | Freq: Every day | ORAL | Status: DC

## 2015-11-25 MED ORDER — OMEPRAZOLE 40 MG PO CPDR: 40.0000 mg | DELAYED_RELEASE_CAPSULE | Freq: Every day | ORAL | Status: DC

## 2015-11-25 NOTE — Patient Instructions (Signed)
  We have reviewed your prior records including labs and tests today.  Test(s) ordered today. Your results will be released to [place] (or called to you) after review, usually within 72hours after test completion. If any changes need to be made, you will be notified at that same time.  All other Health Maintenance issues reviewed.   All recommended immunizations and age-appropriate screenings are up-to-date.  No immunizations administered today.   Medications reviewed and updated.  No changes recommended at this time.  Your prescription(s) have been submitted to your pharmacy. Please take as directed and contact our office if you believe you are having problem(s) with the medication(s).  A referral was ordered for your mammogram.  Please followup annually

## 2015-11-25 NOTE — Progress Notes (Signed)
Subjective:    Patient ID: Sarah Valentine, female    DOB: 09-14-1951, 65 y.o.   MRN: [id number]  HPI She is here to establish with a new pcp.    GERD, Barretts esophagus:  She is taking her medication daily as prescribed.  She denies any GERD symptoms and feels her GERD is well controlled.   Hypertension: She is taking her medication daily. She is compliant with a low sodium diet.  She denies chest pain, palpitations, edema, shortness of breath and regular headaches. She is exercising regularly.  She does not monitor her blood pressure at home.    Hyperlipidemia: she has had elevated cholesterol in the past.  She has lost weight and is exercising regularly. She eats a low fat/cholesterol diet for the most part.    Decreased kidney function:  She has a hereditary nephritis syndrome that many of her family members have.   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  She has seen a nephrologist in the past.   Biopsy is not possible and not thought to be necessary. She is drinking a lot of water.  She rarely takes an nsaid.   Family history of breast cancer:  She has been getting breast MRI's every other years.  She also gets a mammogram yearly.   She is concerned about going on Medicare in a couple of years and whether the MRI will be covered or not. She wonders if she even needs the MRI at this point.   Medications and allergies reviewed with patient and updated if appropriate.  Patient Active Problem List   Diagnosis Date Noted  . Family history of breast cancer 11/25/2015  . 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 Incomplete RBBB   . GERD (gastroesophageal reflux disease)   . Hypertension   . Hyperlipidemia   . 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[place])   . Migraines   . BARRETTS ESOPHAGUS 09/05/2010  . PERSONAL HISTORY OF COLONIC POLYPS 09/05/2010    Current Outpatient Prescriptions on File Prior to Visit  Medication Sig Dispense Refill  . Cranberry 1000 MG CAPS Take 500  mg by mouth daily.      No current facility-administered medications on file prior to visit.    Past Medical History  Diagnosis Date  . [place] 2010  . Hypertension   . Hx of adenomatous colonic polyps 12/2008  . Hyperlipidemia   . 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[place]) 2011    Melanoma in situ (L) buttock, dr Delman Cheadle  . GERD (gastroesophageal reflux disease)   . Migraines   . BCC (basal cell carcinoma), trunk 08/2013    removed from back ([facility])  . Nephritis, hereditary     familial chronic nephritis  . 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 Abdominal hysterectomy  1993  . Cholecystectomy  2010  . Perinephtric abscess with peritonitis  1980  . Colonoscopy w/ biopsies and polypectomy  12/2008    polyp removed  . Melanoma excision Left 2011    buttock  . Basal cell carcinoma excision  08/2013    back  . Squamous cell carcinoma excision  01/2014    back    Social History   Social History  . Marital Status: Married    Spouse Name: N/A  . Number of Children: N/A  . Years of Education: N/A   Occupational History  .  nurse    Social History Main Topics  . Smoking status: Never Smoker   . Smokeless tobacco: Never Used  . Alcohol Use: No  . Drug Use: No  . Sexual Activity: Not Asked   Other Topics Concern  . 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 Breast cancer Mother 36  . Arthritis Mother   . Hyperlipidemia Mother   . Heart disease Mother   . Hypertension Mother   . Kidney disease Mother   . Diabetes Mother   . Arthritis Father   . Esophageal cancer Father 34  . Colon cancer Maternal  Grandmother 78  . Arthritis Maternal Grandmother   . Hypertension Maternal Grandmother   . Kidney disease Maternal Grandmother   . Breast cancer Maternal Grandmother   . Diabetes Maternal Grandmother   . Breast cancer Maternal Aunt     x 2 aunt  . Arthritis Other   . Kidney disease Other     Lport nephritis -pt states mostly maternal side of family as the disease  . Esophageal cancer Paternal Uncle   . Rectal cancer Neg Hx   . Stomach cancer Neg Hx   . Pancreatic cancer Neg Hx     Review of Systems  Constitutional: Negative for fever and chills.  Respiratory: Positive for cough (residual URI). Negative for shortness of breath and wheezing.   Cardiovascular: Negative for chest pain, palpitations and leg swelling.  Gastrointestinal: Negative for nausea and abdominal pain.       Rare GERD symptoms  Neurological: Negative for dizziness, light-headedness and headaches.       Objective:   Filed Vitals:   11/25/15 0825  BP: 116/82  Pulse: 76  Temp: 98.2 F (36.8 C)  Resp: 16   Filed Weights   11/25/15 0825  Weight: 142 lb (64.411 kg)   Body mass index is 24.76 kg/(m^2).   Physical Exam Constitutional: Appears well-developed and well-nourished. No distress.  Neck: Neck supple. No tracheal deviation present. No thyromegaly present.  No carotid bruit. No cervical adenopathy.   Cardiovascular: Normal rate, regular rhythm and normal heart sounds.   No murmur heard.  No edema Pulmonary/Chest: Effort normal and breath sounds normal. No respiratory distress. No wheezes.       Assessment & Plan:   See Problem List for Assessment and Plan of chronic medical problems.   follow-up annually, blood work twice a year to monitor kidney function

## 2015-11-25 NOTE — Assessment & Plan Note (Signed)
History of hyperlipidemia has been exercising and has lost weight  Recheck lipid pane

## 2015-11-25 NOTE — Assessment & Plan Note (Signed)
Pressure well-controlled   continue current medication

## 2015-11-25 NOTE — Assessment & Plan Note (Signed)
controllled, stable   does not get symtpoms, but did not have symptoms prior to being diagnosed with GERD Continue omeprazole 40 mg daily

## 2015-11-25 NOTE — Progress Notes (Signed)
Pre visit review using our clinic review tool, if applicable. No additional management support is needed unless otherwise documented below in the visit note. 

## 2015-11-25 NOTE — Assessment & Plan Note (Signed)
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

## 2015-11-25 NOTE — Assessment & Plan Note (Addendum)
Continue omeprazole 40 mg daily No evidence of Barrett's on last EGD 2016   typically has not had any GERD symptom

## 2015-11-27 ENCOUNTER — Other Ambulatory Visit: Payer: Self-pay | Admitting: Internal Medicine

## 2015-11-27 DIAGNOSIS — Z1231 Encounter for screening mammogram for malignant neoplasm of breast: Secondary | ICD-10-CM

## 2015-12-05 ENCOUNTER — Encounter: Payer: Self-pay | Admitting: Internal Medicine

## 2015-12-18 ENCOUNTER — Encounter: Payer: Self-pay | Admitting: Nurse Practitioner

## 2015-12-18 ENCOUNTER — Ambulatory Visit (INDEPENDENT_AMBULATORY_CARE_PROVIDER_SITE_OTHER): Payer: BLUE CROSS/BLUE SHIELD | Admitting: Nurse Practitioner

## 2015-12-18 VITALS — BP 102/70 | HR 64 | Temp 97.7°F | Ht 63.5 in | Wt 142.2 lb

## 2015-12-18 DIAGNOSIS — Z20818 Contact with and (suspected) exposure to other bacterial communicable diseases: Secondary | ICD-10-CM

## 2015-12-18 DIAGNOSIS — Z2089 Contact with and (suspected) exposure to other communicable diseases: Secondary | ICD-10-CM | POA: Diagnosis not present

## 2015-12-18 LAB — POCT RAPID STREP A (OFFICE): Rapid Strep A Screen: POSITIVE — AB

## 2015-12-18 MED ORDER — AMOXICILLIN-POT CLAVULANATE 875-125 MG PO TABS: 1.0000 | ORAL_TABLET | Freq: Two times a day (BID) | ORAL | Status: DC

## 2015-12-18 NOTE — Progress Notes (Signed)
Pre visit review using our clinic review tool, if applicable. No additional management support is needed unless otherwise documented below in the visit note. 

## 2015-12-18 NOTE — Patient Instructions (Signed)
Please take a probiotic ( Align, [place]ora[place] or Culturelle) while you are on the antibiotic to prevent a serious antibiotic associated diarrhea  Called clostirudium dificile colitis and a vaginal yeast infection.   Augmentin for 10 days (twice daily)

## 2015-12-18 NOTE — Progress Notes (Signed)
Patient ID: Sarah Valentine, female    DOB: 1951/09/22  Age: 65 y.o. MRN: [id number]  CC: Strep A exposure   HPI Sarah Valentine presents for CC of strep A exposure.  1) Pt reports keeping her 51 and a [age] year old granddaughter  Swollen glands x 3 days Sore throat beginning HA starting today Denies fever No treatment to date  History Sarah Valentine has a past medical history of BARRETTS ESOPHAGUS (2010); Hypertension; adenomatous colonic polyps (12/2008); Hyperlipidemia; 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[place]) (2011); GERD (gastroesophageal reflux disease); Migraines; BCC (basal cell carcinoma), trunk (08/2013); Nephritis, hereditary; and Squamous cell carcinoma in situ of skin (01/2014).   She has past surgical history that includes Abdominal hysterectomy (1993); Cholecystectomy (2010); Perinephtric abscess with peritonitis (1980); Colonoscopy w/ biopsies and polypectomy (12/2008); Melanoma excision (Left, 2011); Excision basal cell carcinoma (08/2013); and Squamous cell carcinoma excision (01/2014).   Her family history includes Arthritis in her father, maternal grandmother, mother, and other; Breast cancer in her maternal aunt and maternal grandmother; Breast cancer (age of onset: 30) in her mother; Colon cancer (age of onset: 19) in her maternal grandmother; Diabetes in her maternal grandmother and mother; Esophageal cancer in her paternal uncle; Esophageal cancer (age of onset: 37) in her father; Heart disease in her mother; Hyperlipidemia in her mother; Hypertension in her maternal grandmother and mother; Kidney disease in her maternal grandmother, mother, and other. There is no history of Rectal cancer, Stomach cancer, or Pancreatic cancer.She reports that she has never smoked. She has never used smokeless tobacco. She reports that she does not drink alcohol or use illicit drugs.  Outpatient Prescriptions Prior to Visit  Medication Sig Dispense Refill  . Cranberry 1000 MG CAPS Take 500 mg by mouth daily.     Marland Kitchen  olmesartan (BENICAR) 20 MG tablet Take 0.5 tablets (10 mg total) by mouth daily. 45 tablet 3  . omeprazole (PRILOSEC) 40 MG capsule Take 1 capsule (40 mg total) by mouth daily. 90 capsule 3   No facility-administered medications prior to visit.    ROS Review of Systems  Constitutional: Negative for fever, chills, diaphoresis and fatigue.  HENT: Positive for sore throat. Negative for trouble swallowing and voice change.   Respiratory: Negative for chest tightness, shortness of breath and wheezing.   Cardiovascular: Negative for chest pain, palpitations and leg swelling.  Gastrointestinal: Negative for nausea, vomiting and diarrhea.  Skin: Negative for rash.  Neurological: Positive for headaches. Negative for dizziness, weakness and numbness.   Objective:  BP 102/70 mmHg  Pulse 64  Temp(Src) 97.7 F (36.5 C) (Oral)  Ht 5' 3.5" (1.613 m)  Wt 142 lb 4 oz (64.524 kg)  BMI 24.80 kg/m2  SpO2 94%  Physical Exam  Constitutional: She is oriented to person, place, and time. She appears well-developed and well-nourished. No distress.  HENT:  Head: Normocephalic and atraumatic.  Right Ear: External ear normal.  Left Ear: External ear normal.  Mouth/Throat: Oropharynx is clear and moist. No oropharyngeal exudate.  Erythematous, but no exudates  Neck: Normal range of motion. Neck supple.  Cardiovascular: Normal rate, regular rhythm and normal heart sounds.  Exam reveals no gallop and no friction rub.   No murmur heard. Pulmonary/Chest: Effort normal and breath sounds normal. No respiratory distress. She has no wheezes. She has no rales. She exhibits no tenderness.  Lymphadenopathy:    She has cervical adenopathy.  Neurological: She is alert and oriented to person, place, and time. No cranial nerve deficit. She exhibits normal muscle tone.  Coordination normal.  Skin: Skin is warm and dry. No rash noted. She is not diaphoretic.  Psychiatric: She has a normal mood and affect. Her behavior is  normal. Judgment and thought content normal.   Assessment & Plan:   Sarah Valentine was seen today for strep a exposure.  Diagnoses and all orders for this visit:  Strep throat exposure -     POCT rapid strep A  Other orders -     amoxicillin-clavulanate (AUGMENTIN) 875-125 MG tablet; Take 1 tablet by mouth 2 (two) times daily.   I am having Sarah Valentine start on amoxicillin-clavulanate. I am also having her maintain her Cranberry, omeprazole, and olmesartan.  Meds ordered this encounter  Medications  . amoxicillin-clavulanate (AUGMENTIN) 875-125 MG tablet    Sig: Take 1 tablet by mouth 2 (two) times daily.    Dispense:  20 tablet    Refill:  0    Order Specific Question:  Supervising Provider    Answer:  Crecencio Mc [[id number]     Follow-up: Return if symptoms worsen or fail to improve.

## 2015-12-18 NOTE — Assessment & Plan Note (Signed)
Positive strep throat Rapid test Recent exposure Beginning of symptoms  Treatment with augmentin x 10 days  Encouraged probiotics.  Will follow as needed

## 2015-12-26 ENCOUNTER — Ambulatory Visit: Payer: BLUE CROSS/BLUE SHIELD

## 2016-01-09 ENCOUNTER — Ambulatory Visit
Admission: RE | Admit: 2016-01-09 | Discharge: 2016-01-09 | Disposition: A | Discharge status: Home / Self Care | Payer: BLUE CROSS/BLUE SHIELD | Source: Ambulatory Visit | Attending: Internal Medicine | Admitting: Internal Medicine

## 2016-01-09 DIAGNOSIS — Z1231 Encounter for screening mammogram for malignant neoplasm of breast: Secondary | ICD-10-CM

## 2016-02-25 ENCOUNTER — Other Ambulatory Visit: Payer: BLUE CROSS/BLUE SHIELD

## 2016-02-25 ENCOUNTER — Encounter: Payer: Self-pay | Admitting: Family

## 2016-02-25 ENCOUNTER — Ambulatory Visit (INDEPENDENT_AMBULATORY_CARE_PROVIDER_SITE_OTHER): Payer: BLUE CROSS/BLUE SHIELD | Admitting: Family

## 2016-02-25 VITALS — BP 102/72 | HR 69 | Temp 97.4°F | Ht 63.5 in | Wt 141.0 lb

## 2016-02-25 DIAGNOSIS — J029 Acute pharyngitis, unspecified: Secondary | ICD-10-CM

## 2016-02-25 DIAGNOSIS — R591 Generalized enlarged lymph nodes: Secondary | ICD-10-CM | POA: Diagnosis not present

## 2016-02-25 DIAGNOSIS — R599 Enlarged lymph nodes, unspecified: Secondary | ICD-10-CM

## 2016-02-25 NOTE — Patient Instructions (Signed)
Keep watching swollen glands. I would expect these to resolve in the next couple of weeks-month.   If there is no improvement in your symptoms, or if there is any worsening of symptoms, or if you have any additional concerns, please return for re-evaluation; or, if we are closed, consider going to the Emergency Room for evaluation if symptoms urgent.

## 2016-02-25 NOTE — Progress Notes (Signed)
Pre visit review using our clinic review tool, if applicable. No additional management support is needed unless otherwise documented below in the visit note. 

## 2016-02-25 NOTE — Progress Notes (Signed)
Subjective:    Patient ID: Sarah Valentine, female    DOB: 08-03-1951, 65 y.o.   MRN: [id number]   Sarah Valentine is a 65 y.o. female who presents today for an acute visit.    HPI Comments: Patient seen in the clinic February/2017 for strep exposure and treated with Augmentin.Felt better however endorses 'glands feeling sore off and on with mild sore throat.' Sore throat has improved. No known history of seasonal allergies.    Notes granddaughter has been diagnosed with strep 5x.   Sore Throat  This is a recurrent problem. Neither side of throat is experiencing more pain than the other. There has been no fever. Associated symptoms include congestion. Pertinent negatives include no coughing, diarrhea, ear pain, headaches, shortness of breath or vomiting. She has had exposure to strep. Treatments tried: advil cold and sinus  The treatment provided mild relief.   Past Medical History  Diagnosis Date  . [place] 2010  . Hypertension   . Hx of adenomatous colonic polyps 12/2008  . Hyperlipidemia   . 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[place]) 2011    Melanoma in situ (L) buttock, dr Delman Cheadle  . GERD (gastroesophageal reflux disease)   . Migraines   . BCC (basal cell carcinoma), trunk 08/2013    removed from back ([facility])  . Nephritis, hereditary     familial chronic nephritis  . 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 Cranberry 1000 MG CAPS Take 500 mg by mouth daily.     Marland Kitchen olmesartan (BENICAR) 20 MG tablet Take 0.5 tablets (10 mg total) by mouth daily. 45 tablet 3  . omeprazole (PRILOSEC) 40 MG capsule Take 1 capsule (40 mg total) by mouth daily. 90 capsule 3  . amoxicillin-clavulanate (AUGMENTIN) 875-125 MG tablet Take 1 tablet by mouth 2 (two) times daily. (Patient not taking: Reported on 02/25/2016) 20 tablet 0   No current  facility-administered medications on file prior to visit.    Social History  Substance Use Topics  . Smoking status: Never Smoker   . Smokeless tobacco: Never Used  . Alcohol Use: No    Review of Systems  Constitutional: Negative for fever, chills and unexpected weight change.  HENT: Positive for congestion, postnasal drip and rhinorrhea. Negative for ear pain, sinus pressure and sore throat.   Eyes: Negative for visual disturbance.  Respiratory: Negative for cough, shortness of breath and wheezing.   Cardiovascular: Negative for chest pain, palpitations and leg swelling.  Gastrointestinal: Negative for nausea, vomiting and diarrhea.  Musculoskeletal: Positive for back pain (from carrying granddaughter around). Negative for myalgias and arthralgias.  Skin: Negative for rash.  Neurological: Negative for headaches.  Hematological: Positive for adenopathy (neck glands).      Objective:    BP 102/72 mmHg  Pulse 69  Temp(Src) 97.4 F (36.3 C) (Oral)  Ht 5' 3.5" (1.613 m)  Wt 141 lb (63.957 kg)  BMI 24.58 kg/m2  SpO2 98%   Physical Exam  Constitutional: She appears well-developed and well-nourished.  HENT:  Head: Normocephalic and atraumatic.  Right Ear: Hearing, tympanic membrane, external ear and ear canal normal. No drainage, swelling or tenderness. No foreign bodies. Tympanic membrane is not erythematous and not bulging. No middle ear effusion. No decreased hearing is noted.  Left Ear: Hearing, tympanic membrane, external ear and ear canal normal. No drainage, swelling or tenderness.  No foreign bodies. Tympanic membrane is not erythematous and not bulging.  No middle ear effusion. No decreased hearing is noted.  Nose: Nose normal. No rhinorrhea. Right sinus exhibits no maxillary sinus tenderness and no frontal sinus tenderness. Left sinus exhibits no maxillary sinus tenderness and no frontal sinus tenderness.  Mouth/Throat: Uvula is midline, oropharynx is clear and moist and  mucous membranes are normal. No oropharyngeal exudate, posterior oropharyngeal edema, posterior oropharyngeal erythema or tonsillar abscesses.  Eyes: Conjunctivae are normal.  Cardiovascular: Regular Sarah, normal heart sounds and normal pulses.   Pulmonary/Chest: Effort normal and breath sounds normal. She has no wheezes. She has no rhonchi. She has no rales.  Lymphadenopathy:       Head (right side): Submandibular adenopathy present. No submental, no tonsillar, no preauricular, no posterior auricular and no occipital adenopathy present.       Head (left side): Submandibular adenopathy present. No submental, no tonsillar, no preauricular, no posterior auricular and no occipital adenopathy present.    She has no cervical adenopathy.  Slightly enlarged shotty bilateral submandibular lymph nodes. Mildly tender. Not fixed or hard.   Neurological: She is alert.  Skin: Skin is warm and dry.  Psychiatric: She has a normal mood and affect. Her speech is normal and behavior is normal. Thought content normal.  Vitals reviewed.      Assessment & Plan:   1. Sore throat Viral v allergic. Trial of flonase or antihistamine for post nasal drip.  - POCT rapid strep A - Culture, Group A Strep; Future  2. Enlarged lymph nodes Reactive lymph nodes in context of recent viral v. Allergic etiology. NO worrisome features including weight loss, night chills, fever. Advised patient to stay vigilant and return for further evaluation if lymph nodes enlargement/tenderness doesn't resolve in the next couple of weeks.    I am having Ms. Brummell maintain her Cranberry, omeprazole, olmesartan, and amoxicillin-clavulanate.   Start medications as prescribed and explained to patient on After Visit Summary ( AVS). Risks, benefits, and alternatives of the medications and treatment plan prescribed today were discussed, and patient expressed understanding.   Education regarding symptom management and diagnosis given to  patient.   Follow-up:Plan follow-up as discussed or as needed if any worsening symptoms or change in condition. No Follow-up on file.   Continue to follow with Binnie Rail, MD for routine health maintenance.   Ezzie Dural and I agreed with plan.   Mable Paris, FNP

## 2016-02-27 LAB — CULTURE, GROUP A STREP: ORGANISM ID, BACTERIA: NORMAL

## 2016-03-04 ENCOUNTER — Encounter: Payer: Self-pay | Admitting: Medical

## 2016-03-04 ENCOUNTER — Telehealth: Payer: Self-pay | Admitting: Internal Medicine

## 2016-03-04 ENCOUNTER — Ambulatory Visit (INDEPENDENT_AMBULATORY_CARE_PROVIDER_SITE_OTHER): Payer: BLUE CROSS/BLUE SHIELD | Admitting: Medical

## 2016-03-04 ENCOUNTER — Ambulatory Visit (HOSPITAL_BASED_OUTPATIENT_CLINIC_OR_DEPARTMENT_OTHER)
Admission: RE | Admit: 2016-03-04 | Discharge: 2016-03-04 | Disposition: A | Discharge status: Home / Self Care | Payer: BLUE CROSS/BLUE SHIELD | Source: Ambulatory Visit | Attending: Medical | Admitting: Medical

## 2016-03-04 ENCOUNTER — Ambulatory Visit: Payer: BLUE CROSS/BLUE SHIELD | Admitting: Family

## 2016-03-04 VITALS — BP 104/76 | HR 68 | Temp 100.7°F | Ht 63.5 in | Wt 140.0 lb

## 2016-03-04 DIAGNOSIS — M545 Low back pain, unspecified: Secondary | ICD-10-CM

## 2016-03-04 DIAGNOSIS — R509 Fever, unspecified: Secondary | ICD-10-CM

## 2016-03-04 DIAGNOSIS — R059 Cough, unspecified: Secondary | ICD-10-CM

## 2016-03-04 DIAGNOSIS — R05 Cough: Secondary | ICD-10-CM | POA: Diagnosis not present

## 2016-03-04 DIAGNOSIS — R1032 Left lower quadrant pain: Secondary | ICD-10-CM | POA: Diagnosis not present

## 2016-03-04 DIAGNOSIS — R591 Generalized enlarged lymph nodes: Secondary | ICD-10-CM | POA: Diagnosis not present

## 2016-03-04 LAB — COMPREHENSIVE METABOLIC PANEL
ALT: 28 U/L (ref 0–35)
AST: 30 U/L (ref 0–37)
Albumin: 4 g/dL (ref 3.5–5.2)
Alkaline Phosphatase: 121 U/L — ABNORMAL HIGH (ref 39–117)
BUN: 23 mg/dL (ref 6–23)
CALCIUM: 9.6 mg/dL (ref 8.4–10.5)
CO2: 28 mEq/L (ref 19–32)
Chloride: 103 mEq/L (ref 96–112)
Creatinine, Ser: 1.44 mg/dL — ABNORMAL HIGH (ref 0.40–1.20)
GFR: 39 mL/min — ABNORMAL LOW (ref >60.00)
Glucose, Bld: 122 mg/dL — ABNORMAL HIGH (ref 70–99)
Potassium: 4.1 mEq/L (ref 3.5–5.1)
SODIUM: 139 meq/L (ref 135–145)
Total Bilirubin: 0.3 mg/dL (ref 0.2–1.2)
Total Protein: 7 g/dL (ref 6.0–8.3)

## 2016-03-04 LAB — CBC WITH DIFFERENTIAL/PLATELET
BASOS PCT: 0.5 % (ref 0.0–3.0)
Basophils Absolute: 0 10*3/uL (ref 0.0–0.1)
EOS ABS: 0.1 10*3/uL (ref 0.0–0.7)
Eosinophils Relative: 1.1 % (ref 0.0–5.0)
HEMATOCRIT: 38.3 % (ref 36.0–46.0)
Hemoglobin: 12.7 g/dL (ref 12.0–15.0)
LYMPHS ABS: 1 10*3/uL (ref 0.7–4.0)
Lymphocytes Relative: 13.3 % (ref 12.0–46.0)
MCHC: 33.2 g/dL (ref 30.0–36.0)
MCV: 85.3 fl (ref 78.0–100.0)
MONO ABS: 0.9 10*3/uL (ref 0.1–1.0)
Monocytes Relative: 11.8 % (ref 3.0–12.0)
NEUTROS ABS: 5.4 10*3/uL (ref 1.4–7.7)
Neutrophils Relative %: 73.3 % (ref 43.0–77.0)
Platelets: 280 10*3/uL (ref 150.0–400.0)
RBC: 4.48 Mil/uL (ref 3.87–5.11)
RDW: 13.9 % (ref 11.5–15.5)
WBC: 7.3 10*3/uL (ref 4.0–10.5)

## 2016-03-04 LAB — POC URINALSYSI DIPSTICK (AUTOMATED)
Bilirubin, UA: NEGATIVE
GLUCOSE UA: NEGATIVE
Ketones, UA: NEGATIVE
Leukocytes, UA: NEGATIVE
Nitrite, UA: NEGATIVE
SPEC GRAV UA: 1.025
UROBILINOGEN UA: 0.2
pH, UA: 6

## 2016-03-04 MED ORDER — CIPROFLOXACIN HCL 500 MG PO TABS: 500.0000 mg | ORAL_TABLET | Freq: Two times a day (BID) | ORAL | Status: DC

## 2016-03-04 MED ORDER — METRONIDAZOLE 500 MG PO TABS: 500.0000 mg | ORAL_TABLET | Freq: Three times a day (TID) | ORAL | Status: DC

## 2016-03-04 NOTE — Addendum Note (Signed)
Addended by: Tasia Catchings on: 03/04/2016 01:25 PM   Modules accepted: Orders

## 2016-03-04 NOTE — Patient Instructions (Addendum)
For your abdomen pain, tender lymph nodes, back pain and abdomen pain will get ua,  Urine culture, cbc, cmp and cxr.  You declined cough med today but if cough worsen please notify me.  Will get cxr make sure no walking pneumonia.  If cxr clear then start cipro and flagyl for possible diverticulitis  Follow up in 4-5 days or as needed. You could update [place] Thursday late or Friday how you feel since you have [place]. If any changing signs or symptoms on vacation then be see in [place].

## 2016-03-04 NOTE — Progress Notes (Signed)
Subjective:    Patient ID: Sarah Valentine, female    DOB: 1951-05-14, 65 y.o.   MRN: [id number]  HPI   Pt in for some recurrent exposure to strep through grandaughter. Pt has no sore throat.  2 weeks ago grandaughter had strep. Pt states feb or march treated for strep. Since then rt side lymph nodes still faint tender. But recently no sorethroat at all.   Pt states last night she got fever last night of 101.  No pain on urination. No mid lower abdomen pain. Faint back pain and faint lt lower quadrant for about one week-10 days.(she points to left flank/lower quadrant as source of pain.  Pt has had colonoscopy before. Some polyps seen.  Cough only for one day. But not productive. Sometimes feels like swallows mucous after coughing.     Review of Systems  Constitutional: Positive for fever. Negative for chills and fatigue.  Respiratory: Positive for cough. Negative for apnea, chest tightness, shortness of breath and wheezing.        Mild rare for one day.  Cardiovascular: Negative for chest pain and palpitations.  Gastrointestinal: Positive for abdominal pain. Negative for nausea, vomiting, diarrhea, constipation, blood in stool and anal bleeding.  Genitourinary: Negative for dysuria, urgency, frequency, flank pain, difficulty urinating and pelvic pain.  Musculoskeletal: Positive for back pain.  Neurological: Negative for dizziness and headaches.  Hematological: Negative for adenopathy. Does not bruise/bleed easily.  Psychiatric/Behavioral: Negative for behavioral problems and confusion.   Past Medical History  Diagnosis Date  . [place] 2010  . Hypertension   . Hx of adenomatous colonic polyps 12/2008  . Hyperlipidemia   . 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 GERD (gastroesophageal reflux disease)   . Migraines   . BCC (basal cell carcinoma), trunk 08/2013    removed from back (g[facility])  . Nephritis, hereditary     familial  chronic nephritis  . 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 Marital Status: Married    Spouse Name: N/A  . Number of Children: N/A  . Years of Education: N/A   Occupational History  . nurse    Social History Main Topics  . Smoking status: Never Smoker   . Smokeless tobacco: Never Used  . Alcohol Use: No  . Drug Use: No  . Sexual Activity: Not on file   Other Topics Concern  . 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 Abdominal hysterectomy  1993  . Cholecystectomy  2010  . Perinephtric abscess with peritonitis  1980  . Colonoscopy w/ biopsies and polypectomy  12/2008    polyp removed  . Melanoma excision Left 2011    buttock  . Basal cell carcinoma excision  08/2013    back  . Squamous cell carcinoma excision  01/2014    back    Family History  Problem Relation Age of Onset  . Breast cancer Mother 17  . Arthritis Mother   . Hyperlipidemia Mother   . Heart disease  Mother   . Hypertension Mother   . Kidney disease Mother   . Diabetes Mother   . Arthritis Father   . Esophageal cancer Father 6  . Colon cancer Maternal Grandmother 78  . Arthritis Maternal Grandmother   . Hypertension Maternal Grandmother   . Kidney disease Maternal Grandmother   . Breast cancer Maternal Grandmother   . Diabetes Maternal Grandmother   . Breast cancer Maternal Aunt     x 2 aunt  . Arthritis Other   . Kidney disease Other     [place] nephritis -pt states mostly maternal side of family as the disease  . Esophageal cancer Paternal Uncle   . Rectal cancer Neg Hx   . Stomach cancer Neg Hx   . Pancreatic cancer Neg Hx      Allergies  Allergen Reactions  . Sulfonamide Derivatives Itching and Rash    Current Outpatient Prescriptions on File Prior to Visit  Medication Sig Dispense Refill  . Cranberry 1000 MG CAPS Take 500 mg by mouth daily.     Marland Kitchen olmesartan (BENICAR) 20 MG tablet Take 0.5 tablets (10 mg total) by mouth daily. 45 tablet 3  . omeprazole (PRILOSEC) 40 MG capsule Take 1 capsule (40 mg total) by mouth daily. 90 capsule 3   No current facility-administered medications on file prior to visit.    BP 104/76 mmHg  Pulse 68  Temp(Src) 100.7 F (38.2 C) (Oral)  Ht 5' 3.5" (1.613 m)  Wt 140 lb (63.504 kg)  BMI 24.41 kg/m2  SpO2 98%       Objective:   Physical Exam  General Mental Status- Alert. General Appearance- Not in acute distress.   Skin General: Color- Normal Color. Moisture- Normal Moisture.  Neck Carotid Arteries- Normal color. Moisture- Normal Moisture. No carotid bruits. No JVD. Faint tender rt submandibluar area but no obvious lymph nodes.  Chest and Lung Exam Auscultation: Breath Sounds:-Normal.  Cardiovascular Auscultation:Rythm- Regular. Murmurs & Other Heart Sounds:Auscultation of the heart reveals- No Murmurs.  Abdomen Inspection:-Inspeection Normal. Palpation/Percussion:Note:No mass. Palpation and Percussion of the abdomen reveal- Non Tende except faint tender left lower quadrant at best, Non Distended + BS, no rebound or guarding.  Back- no cva pain. Mild left si area pain on palpation  Neurologic Cranial Nerve exam:- CN III-XII intact(No nystagmus), symmetric smile. Strength:- 5/5 equal and symmetric strength both upper and lower extremities.      Assessment & Plan:  For your abdomen pain, tender lymph nodes, back pain and abdomen pain will get ua,  Urine culture, cbc, cmp and cxr.  You declined cough med today but if cough worsen please notify me.  Will get cxr make sure no walking pneumonia.  If cxr clear then start cipro and flagyl for  possible diverticulitis  Follow up in 4-5 days or as needed. You could update [place] Thursday late or Friday how you feel since you have [place]. If any changing signs or symptoms on vacation then be see in [place].

## 2016-03-04 NOTE — Telephone Encounter (Signed)
Patient Name: [name]  DOB: 31-Mar-1951    Initial Comment Caller state sshe woke up with a fever and cough- back ache and abd pain- 100.0 currently   Nurse Assessment      Guidelines    Guideline Title Affirmed Question Affirmed Notes       Final Disposition User   Clinical Call [place], RN, [name] states that she was able to get an appt at the [facility] office and does not need to speak with a nurse.

## 2016-03-04 NOTE — Progress Notes (Signed)
Pre visit review using our clinic review tool, if applicable. No additional management support is needed unless otherwise documented below in the visit note. 

## 2016-03-06 LAB — URINE CULTURE
Colony Count: NO GROWTH
Organism ID, Bacteria: NO GROWTH

## 2016-03-10 ENCOUNTER — Telehealth: Payer: Self-pay | Admitting: Medical

## 2016-03-10 DIAGNOSIS — R911 Solitary pulmonary nodule: Secondary | ICD-10-CM

## 2016-03-10 DIAGNOSIS — R059 Cough, unspecified: Secondary | ICD-10-CM

## 2016-03-10 DIAGNOSIS — R05 Cough: Secondary | ICD-10-CM

## 2016-03-10 NOTE — Telephone Encounter (Signed)
For lung nodule found on xray order ct of chest. No prior chest xray to compare. I talked with her before she left for trip. Decided on ct of chest to further evaluate.

## 2016-03-10 NOTE — Telephone Encounter (Signed)
Left message for pt to call back about the note below.

## 2016-03-10 NOTE — Telephone Encounter (Signed)
Please notify her of the plan to get ct. Document if approved that she was notified in event does not get.

## 2016-03-12 NOTE — Telephone Encounter (Signed)
Spoke with pt and she states that she is willing to go forward with the CT scan. Pt voices understanding that she can walk in for the scan and they will get her in for the CT of the chest.

## 2016-03-13 ENCOUNTER — Telehealth: Payer: Self-pay | Admitting: Medical

## 2016-03-13 NOTE — Telephone Encounter (Signed)
Spoke with pt on 03/12/16 and she stated that she was willing to go ahead with the CT scan. She advised that she was coming in the afternoon. She was previously advised to follow up with her PCP and she voices understanding. I have also sent this pt a mychart reminder.

## 2016-03-13 NOTE — Telephone Encounter (Signed)
Was she notified of Ct. Would you call her and see if she plans to do the test. If she does not have plans to do test. Then adivse her to follow up with Dr. Eliezer Champagne pcp. Let me know what she says.

## 2016-08-03 ENCOUNTER — Encounter: Payer: Self-pay | Admitting: Student

## 2016-10-02 ENCOUNTER — Other Ambulatory Visit (INDEPENDENT_AMBULATORY_CARE_PROVIDER_SITE_OTHER): Payer: BLUE CROSS/BLUE SHIELD

## 2016-10-02 DIAGNOSIS — I10 Essential (primary) hypertension: Secondary | ICD-10-CM | POA: Diagnosis not present

## 2016-10-02 DIAGNOSIS — R739 Hyperglycemia, unspecified: Secondary | ICD-10-CM

## 2016-10-02 DIAGNOSIS — N078 Hereditary nephropathy, not elsewhere classified with other morphologic lesions: Secondary | ICD-10-CM | POA: Diagnosis not present

## 2016-10-02 DIAGNOSIS — E785 Hyperlipidemia, unspecified: Secondary | ICD-10-CM | POA: Diagnosis not present

## 2016-10-02 LAB — COMPREHENSIVE METABOLIC PANEL
ALK PHOS: 110 U/L (ref 39–117)
ALT: 18 U/L (ref 0–35)
AST: 22 U/L (ref 0–37)
Albumin: 3.9 g/dL (ref 3.5–5.2)
BILIRUBIN TOTAL: 0.4 mg/dL (ref 0.2–1.2)
BUN: 24 mg/dL — ABNORMAL HIGH (ref 6–23)
CALCIUM: 9.4 mg/dL (ref 8.4–10.5)
CO2: 27 meq/L (ref 19–32)
CREATININE: 1.44 mg/dL — AB (ref 0.40–1.20)
Chloride: 107 mEq/L (ref 96–112)
GFR: 38.93 mL/min — AB (ref >60.00)
GLUCOSE: 88 mg/dL (ref 70–99)
Potassium: 4.3 mEq/L (ref 3.5–5.1)
Sodium: 142 mEq/L (ref 135–145)
TOTAL PROTEIN: 6.7 g/dL (ref 6.0–8.3)

## 2016-10-02 LAB — LIPID PANEL
CHOL/HDL RATIO: 4
Cholesterol: 204 mg/dL — ABNORMAL HIGH (ref 0–200)
HDL: 55.7 mg/dL (ref >39.00)
LDL CALC: 132 mg/dL — AB (ref 0–99)
NonHDL: 148.71
TRIGLYCERIDES: 85 mg/dL (ref 0.0–149.0)
VLDL: 17 mg/dL (ref 0.0–40.0)

## 2016-10-02 LAB — MICROALBUMIN / CREATININE URINE RATIO
CREATININE, U: 25.1 mg/dL
MICROALB UR: 42 mg/dL — AB (ref 0.0–1.9)
MICROALB/CREAT RATIO: 167.5 mg/g — AB (ref 0.0–30.0)

## 2016-10-02 LAB — HEMOGLOBIN A1C: Hgb A1c MFr Bld: 5.9 % (ref 4.6–6.5)

## 2016-10-07 ENCOUNTER — Encounter: Payer: Self-pay | Admitting: Internal Medicine

## 2016-10-07 ENCOUNTER — Ambulatory Visit (INDEPENDENT_AMBULATORY_CARE_PROVIDER_SITE_OTHER): Payer: BLUE CROSS/BLUE SHIELD | Admitting: Internal Medicine

## 2016-10-07 VITALS — BP 118/88 | HR 64 | Temp 98.3°F | Resp 16 | Wt 140.0 lb

## 2016-10-07 DIAGNOSIS — K2100 Gastro-esophageal reflux disease with esophagitis, without bleeding: Secondary | ICD-10-CM

## 2016-10-07 DIAGNOSIS — R911 Solitary pulmonary nodule: Secondary | ICD-10-CM

## 2016-10-07 DIAGNOSIS — I10 Essential (primary) hypertension: Secondary | ICD-10-CM

## 2016-10-07 DIAGNOSIS — R7303 Prediabetes: Secondary | ICD-10-CM | POA: Diagnosis not present

## 2016-10-07 DIAGNOSIS — N078 Hereditary nephropathy, not elsewhere classified with other morphologic lesions: Secondary | ICD-10-CM | POA: Diagnosis not present

## 2016-10-07 HISTORY — DX: Prediabetes: R73.03

## 2016-10-07 HISTORY — DX: Solitary pulmonary nodule: R91.1

## 2016-10-07 MED ORDER — OLMESARTAN MEDOXOMIL 20 MG PO TABS: 10.0000 mg | ORAL_TABLET | Freq: Every day | ORAL | 3 refills | Status: DC

## 2016-10-07 MED ORDER — OMEPRAZOLE 40 MG PO CPDR
40.0000 mg | DELAYED_RELEASE_CAPSULE | Freq: Every day | ORAL | 3 refills | Status: DC
Start: 2016-10-07 — End: 2016-10-12

## 2016-10-07 NOTE — Assessment & Plan Note (Signed)
Mildly elevated a1c Continue regular exercise and healthy diet

## 2016-10-07 NOTE — Progress Notes (Signed)
Pre visit review using our clinic review tool, if applicable. No additional management support is needed unless otherwise documented below in the visit note. 

## 2016-10-07 NOTE — Patient Instructions (Signed)
  All other Health Maintenance issues reviewed.   All recommended immunizations and age-appropriate screenings are up-to-date or discussed.  No immunizations administered today.   Medications reviewed and updated.  No changes recommended at this time.  Your prescription(s) have been submitted to your pharmacy. Please take as directed and contact our office if you believe you are having problem(s) with the medication(s).

## 2016-10-07 NOTE — Assessment & Plan Note (Addendum)
Micro-proteinuria stable GFR stable Continue ACE-I Continue to monitor

## 2016-10-07 NOTE — Progress Notes (Signed)
Subjective:    Patient ID: Sarah Valentine, female    DOB: 1951-05-16, 65 y.o.   MRN: [id number]  HPI The patient is here for follow up.  Nephritis, hereditary:  She denies changes in her urination.  She avoids nsaids and drinks plenty of water during the day.   Hypertension: She is taking her medication daily. She is compliant with a low sodium diet.  She denies chest pain, palpitations, edema, shortness of breath and regular headaches. She is exercising regularly - walks 2 miles a day.      Prediabetes:  She is compliant with a low sugar/carbohydrate diet.  She is exercising regularly.  GERD:  She is taking her medication daily as prescribed.  She denies any GERD symptoms, but has never had GERD symptoms.  She has a history of Barretts.  She is up to date with her EGD.   Right foot pain:  She has pain on the dorsal aspect of her right foot near her first toe.  The pain is intermittent.  It is minimal pain.  She rests and it improves.  She does not take anything for it.    Nodule in LUL on cxr 5/17:  She was going to get a CT scan but was going on vacation and did not feel it was concerning enough to come in sooner.  She has never smoked.  She denies SOB, cough and wheeze.      Medications and allergies reviewed with patient and updated if appropriate.  Patient Active Problem List   Diagnosis Date Noted  . Prediabetes 10/07/2016  . Family history of breast cancer 11/25/2015  . 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 Incomplete RBBB   . GERD (gastroesophageal reflux disease)   . Hypertension   . Hyperlipidemia   . 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[place])   . Migraines   . BARRETTS ESOPHAGUS 09/05/2010  . PERSONAL HISTORY OF COLONIC POLYPS 09/05/2010    Current Outpatient Prescriptions on File Prior to Visit  Medication Sig Dispense Refill  . Cranberry 1000 MG CAPS Take 500 mg by mouth daily.     Marland Kitchen olmesartan (BENICAR) 20 MG tablet Take 0.5 tablets (10 mg total) by mouth daily. 45 tablet 3    . omeprazole (PRILOSEC) 40 MG capsule Take 1 capsule (40 mg total) by mouth daily. 90 capsule 3   No current facility-administered medications on file prior to visit.     Past Medical History:  Diagnosis Date  . [place] 2010  . BCC (basal cell carcinoma), trunk 08/2013   removed from back ([facility])  . GERD (gastroesophageal reflux disease)   . Hx of adenomatous colonic polyps 12/2008  . Hyperlipidemia   . Hypertension   . 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 Migraines   . Nephritis, hereditary    familial chronic nephritis  . 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 ABDOMINAL HYSTERECTOMY  1993  . BASAL CELL CARCINOMA EXCISION  08/2013   back  . CHOLECYSTECTOMY  2010  . COLONOSCOPY W/ BIOPSIES AND POLYPECTOMY  12/2008   polyp removed  . MELANOMA EXCISION Left 2011   buttock  . Perinephtric abscess with peritonitis  1980  . SQUAMOUS CELL CARCINOMA EXCISION  01/2014   back    Social History   Social History  . Marital status: Married  Spouse name: N/A  . Number of children: N/A  . Years of education: N/A   Occupational History  . nurse [employer]   Social History Main Topics  . Smoking status: Never Smoker  . Smokeless tobacco: Never Used  . Alcohol use No  . Drug use: No  . Sexual activity: Not Asked   Other Topics Concern  . 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 Breast cancer Mother 7  . Arthritis Mother   . Hyperlipidemia Mother   . Heart disease Mother   . Hypertension Mother   . Kidney disease Mother   . Diabetes  Mother   . Arthritis Father   . Esophageal cancer Father 57  . Colon cancer Maternal Grandmother 78  . Arthritis Maternal Grandmother   . Hypertension Maternal Grandmother   . Kidney disease Maternal Grandmother   . Breast cancer Maternal Grandmother   . Diabetes Maternal Grandmother   . Breast cancer Maternal Aunt     x 2 aunt  . Arthritis Other   . Kidney disease Other     Lport nephritis -pt states mostly maternal side of family as the disease  . Esophageal cancer Paternal Uncle   . Rectal cancer Neg Hx   . Stomach cancer Neg Hx   . Pancreatic cancer Neg Hx     Review of Systems  Constitutional: Negative for chills and fever.  Respiratory: Negative for cough, shortness of breath and wheezing.   Cardiovascular: Negative for chest pain, palpitations and leg swelling.  Gastrointestinal: Negative for abdominal pain.  Genitourinary: Negative for difficulty urinating, dysuria, frequency and hematuria.  Neurological: Negative for light-headedness and headaches.       Objective:   Vitals:   10/07/16 1300  BP: 118/88  Pulse: 64  Resp: 16  Temp: 98.3 F (36.8 C)   Filed Weights   10/07/16 1300  Weight: 140 lb (63.5 kg)   Body mass index is 24.41 kg/m.   Physical Exam     Constitutional: Appears well-developed and well-nourished. No distress.  HENT:  Head: Normocephalic and atraumatic.  Neck: Neck supple. No tracheal deviation present. No thyromegaly present.  No cervical lymphadenopathy Cardiovascular: Normal rate, regular rhythm and normal heart sounds.   No murmur heard. No carotid bruit .  No edema Pulmonary/Chest: Effort normal and breath sounds normal. No respiratory distress. No has no wheezes. No rales.  Skin: Skin is warm and dry. Not diaphoretic.  Psychiatric: Normal mood and affect. Behavior is normal.      Assessment & Plan:    See Problem List for Assessment and Plan of chronic medical problems.

## 2016-10-07 NOTE — Assessment & Plan Note (Addendum)
H/o Barretts Last EGD showed no Barretts, no EGD needed Silent GERD Continue daily medication

## 2016-10-07 NOTE — Assessment & Plan Note (Signed)
BP well controlled Current regimen effective and well tolerated Continue current medications at current doses  

## 2016-10-07 NOTE — Assessment & Plan Note (Signed)
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

## 2016-10-09 ENCOUNTER — Encounter: Payer: Self-pay | Admitting: Internal Medicine

## 2016-10-12 ENCOUNTER — Other Ambulatory Visit: Payer: Self-pay | Admitting: Emergency Medicine

## 2016-10-12 MED ORDER — OMEPRAZOLE 40 MG PO CPDR: 40.0000 mg | DELAYED_RELEASE_CAPSULE | Freq: Every day | ORAL | 3 refills | Status: DC

## 2016-10-21 ENCOUNTER — Inpatient Hospital Stay: Admission: RE | Admit: 2016-10-21 | Payer: BLUE CROSS/BLUE SHIELD | Source: Ambulatory Visit

## 2016-10-22 ENCOUNTER — Ambulatory Visit (INDEPENDENT_AMBULATORY_CARE_PROVIDER_SITE_OTHER)
Admission: RE | Admit: 2016-10-22 | Discharge: 2016-10-22 | Disposition: A | Discharge status: Home / Self Care | Payer: BLUE CROSS/BLUE SHIELD | Source: Ambulatory Visit | Attending: Internal Medicine | Admitting: Internal Medicine

## 2016-10-22 DIAGNOSIS — R911 Solitary pulmonary nodule: Secondary | ICD-10-CM

## 2017-01-07 ENCOUNTER — Other Ambulatory Visit: Payer: Self-pay | Admitting: Internal Medicine

## 2017-01-07 DIAGNOSIS — Z1231 Encounter for screening mammogram for malignant neoplasm of breast: Secondary | ICD-10-CM

## 2017-02-01 ENCOUNTER — Ambulatory Visit
Admission: RE | Admit: 2017-02-01 | Discharge: 2017-02-01 | Disposition: A | Discharge status: Home / Self Care | Payer: BLUE CROSS/BLUE SHIELD | Source: Ambulatory Visit | Attending: Internal Medicine | Admitting: Internal Medicine

## 2017-02-01 DIAGNOSIS — Z1231 Encounter for screening mammogram for malignant neoplasm of breast: Secondary | ICD-10-CM

## 2017-02-04 ENCOUNTER — Telehealth: Payer: Self-pay | Admitting: Gastroenterology

## 2017-02-04 NOTE — Telephone Encounter (Signed)
Pt saw he PCP and had labs done and her kidney function was "not good." States her PCP mentioned perhaps lowering the dose to omeprazole 20mg . Pt also wants to know if she could have another EGD when she has colon done in October to make sure there is still no barretts. Pt aware that Dr. Ardis Hughs is out of town, she will be expecting a call back next week.  Please advise.

## 2017-02-08 MED ORDER — OMEPRAZOLE 20 MG PO CPDR: 20.0000 mg | DELAYED_RELEASE_CAPSULE | Freq: Every day | ORAL | 3 refills | Status: DC

## 2017-02-08 NOTE — Telephone Encounter (Signed)
Pt has been notified and prescription for 20 mg has been sent to the pharmacy.

## 2017-02-08 NOTE — Telephone Encounter (Signed)
She does not need a repeat EGD (biopsies 2012 showed no Barrett's; biopsies in 2015 also showed no Barrett's).   Ok for her to decrease the omeprazole to 20mg  once daily as long as this still controls her GERD symptoms.  Thanks

## 2017-04-16 ENCOUNTER — Encounter: Payer: Self-pay | Admitting: Emergency Medicine

## 2017-04-16 ENCOUNTER — Emergency Department
Admission: EM | Admit: 2017-04-16 | Discharge: 2017-04-16 | Disposition: A | Discharge status: Home / Self Care | Payer: BLUE CROSS/BLUE SHIELD | Source: Home / Self Care | Attending: Family Medicine | Admitting: Family Medicine

## 2017-04-16 DIAGNOSIS — L089 Local infection of the skin and subcutaneous tissue, unspecified: Secondary | ICD-10-CM | POA: Diagnosis not present

## 2017-04-16 DIAGNOSIS — T148XXA Other injury of unspecified body region, initial encounter: Secondary | ICD-10-CM

## 2017-04-16 DIAGNOSIS — S80811A Abrasion, right lower leg, initial encounter: Secondary | ICD-10-CM

## 2017-04-16 MED ORDER — MUPIROCIN 2 % EX OINT: TOPICAL_OINTMENT | CUTANEOUS | 0 refills | Status: DC

## 2017-04-16 MED ORDER — CEPHALEXIN 500 MG PO CAPS: 500.0000 mg | ORAL_CAPSULE | Freq: Two times a day (BID) | ORAL | 0 refills | Status: DC

## 2017-04-16 MED ORDER — MUPIROCIN 2 % EX OINT
TOPICAL_OINTMENT | CUTANEOUS | 0 refills | Status: DC
Start: 2017-04-16 — End: 2017-04-16

## 2017-04-16 NOTE — ED Triage Notes (Signed)
Patient presents to [facility] with an Abrasion to her Right Shin. Patient states that she fell on Saturday and she feels like there is something in her wound. Patient is concerned about infection

## 2017-04-16 NOTE — ED Provider Notes (Signed)
CSN: [id number]     Arrival date & time 04/16/17  1659 History   First MD Initiated Contact with Patient 04/16/17 1713     Chief Complaint  Patient presents with  . Abrasion   (Consider location/radiation/quality/duration/timing/severity/associated sxs/prior Treatment) HPI  Sarah Valentine is a 66 y.o. female presenting to UC with c/o gradually worsening redness, swelling and soreness, 3/10, on Right anterior lower leg from an abrasion. She reports falling and hitting her shins [place]  She has cleaned with soap and water and has been applying neosporin with no relief. Mild bruising also noted. Denies fever or chills.  Last tetanus: 2017   Past Medical History:  Diagnosis Date  . [place] 2010  . BCC (basal cell carcinoma), trunk 08/2013   removed from back ([facility])  . GERD (gastroesophageal reflux disease)   . Hx of adenomatous colonic polyps 12/2008  . Hyperlipidemia   . Hypertension   . 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 Migraines   . Nephritis, hereditary    familial chronic nephritis  . 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 ABDOMINAL HYSTERECTOMY  1993  . BASAL CELL CARCINOMA EXCISION  08/2013   back  . CHOLECYSTECTOMY  2010  . COLONOSCOPY W/ BIOPSIES AND POLYPECTOMY  12/2008   polyp removed  . MELANOMA EXCISION Left 2011   buttock  . Perinephtric abscess with peritonitis  1980  . SQUAMOUS CELL CARCINOMA EXCISION  01/2014   back   Family History  Problem Relation Age of Onset  . Breast cancer Mother 71  . Arthritis Mother   . Hyperlipidemia Mother   . Heart disease Mother   . Hypertension Mother   . Kidney disease Mother   . Diabetes Mother   . Arthritis Father   . Esophageal cancer Father 57  . Colon cancer Maternal Grandmother 78  . Arthritis Maternal Grandmother   .  Hypertension Maternal Grandmother   . Kidney disease Maternal Grandmother   . Breast cancer Maternal Grandmother   . Diabetes Maternal Grandmother   . Breast cancer Maternal Aunt        x 2 aunt  . Arthritis Other   . Kidney disease Other        Lport nephritis -pt states mostly maternal side of family as the disease  . Esophageal cancer Paternal Uncle   . Rectal cancer Neg Hx   . Stomach cancer Neg Hx   . Pancreatic cancer Neg Hx    Social History  Substance Use Topics  . Smoking status: Never Smoker  . Smokeless tobacco: Never Used  . Alcohol use No   OB History    No data available     Review of Systems  Constitutional: Negative for chills and fever.  Musculoskeletal: Positive for myalgias (anterior shin). Negative for arthralgias.  Skin: Positive for color change and wound.    Allergies  Sulfonamide derivatives  Home Medications   Prior to Admission medications   Medication Sig Start Date End Date Taking? Authorizing Provider  olmesartan (BENICAR) 20 MG tablet Take 0.5 tablets (10 mg total) by mouth daily. 10/07/16  Yes Burns, Claudina Lick, MD  omeprazole (PRILOSEC) 20 MG capsule Take 1 capsule (20 mg total) by mouth daily. 02/08/17 05/09/17 Yes Milus Banister, MD  cephALEXin [facility]) 500  MG capsule Take 1 capsule (500 mg total) by mouth 2 (two) times daily. 04/16/17   Noe Gens, PA-C  Cranberry 1000 MG CAPS Take 500 mg by mouth daily.     [provider]  mupirocin ointment (BACTROBAN) 2 % Apply to wound 3 times daily for 5 days 04/16/17   Noe Gens, PA-C   Meds Ordered and Administered this Visit  Medications - No data to display  BP 128/85 (BP Location: Right Arm)   Pulse 71   Temp 98.6 F (37 C) (Oral)   Resp 16   Ht 5' 3.5" (1.613 m)   Wt 138 lb (62.6 kg)   SpO2 100%   BMI 24.06 kg/m  No data found.   Physical Exam  Constitutional: She is oriented to person, place, and time. She appears well-developed and well-nourished.  HENT:  Head:  Normocephalic and atraumatic.  Eyes: EOM are normal.  Neck: Normal range of motion.  Cardiovascular: Normal rate.   Pulmonary/Chest: Effort normal.  Musculoskeletal: Normal range of motion.       Legs: Neurological: She is alert and oriented to person, place, and time.  Skin: Skin is warm and dry. Abrasion and ecchymosis noted. There is erythema.  Right lower leg: abrasion to anterior aspect. Darkened scab, faint surrounding erythema, mild edema. Faint ecchymosis.  Left lower leg: superficial appears, pink in color. Appear to be healing well.   Psychiatric: She has a normal mood and affect. Her behavior is normal.  Nursing note and vitals reviewed.   Urgent Care Course     Procedures (including critical care time)  Labs Review Labs Reviewed - No data to display  Imaging Review No results found.   MDM   1. Infected wound   2. Abrasion of anterior right lower leg, initial encounter    Abrasion to Right lower leg concerning for early infection w/o abscess.  Rx: Keflex and mupirocin ointment Home care instructions provided. F/u with PCP in 1 week as needed.    Noe Gens, [place] 04/16/17 1731

## 2017-04-19 ENCOUNTER — Telehealth: Payer: Self-pay | Admitting: *Deleted

## 2017-04-19 NOTE — Telephone Encounter (Signed)
Spoke to pt she reports that her wound is improving. Advised her to complete ABT and to call back if she has any questions or concerns.

## 2017-04-23 ENCOUNTER — Encounter: Payer: Self-pay | Admitting: Internal Medicine

## 2017-04-23 ENCOUNTER — Ambulatory Visit (INDEPENDENT_AMBULATORY_CARE_PROVIDER_SITE_OTHER): Payer: BLUE CROSS/BLUE SHIELD | Admitting: Internal Medicine

## 2017-04-23 DIAGNOSIS — M25571 Pain in right ankle and joints of right foot: Secondary | ICD-10-CM | POA: Insufficient documentation

## 2017-04-23 DIAGNOSIS — L03115 Cellulitis of right lower limb: Secondary | ICD-10-CM | POA: Diagnosis not present

## 2017-04-23 DIAGNOSIS — M79671 Pain in right foot: Secondary | ICD-10-CM | POA: Diagnosis not present

## 2017-04-23 MED ORDER — CEPHALEXIN 500 MG PO CAPS: 500.0000 mg | ORAL_CAPSULE | Freq: Three times a day (TID) | ORAL | 0 refills | Status: DC

## 2017-04-23 NOTE — Assessment & Plan Note (Signed)
Mild, overall much improved, for antibx course to finish 10 days,  to f/u any worsening symptoms or concerns

## 2017-04-23 NOTE — Assessment & Plan Note (Signed)
Mild, c/w mild plantar fasciitis vs heel spur, for advil prn, also to see sport med if not improved 1-2 wks

## 2017-04-23 NOTE — Assessment & Plan Note (Signed)
C/w mild left medial tarsal tunnell, for advil prn,  to f/u any worsening symptoms or concerns

## 2017-04-23 NOTE — Patient Instructions (Signed)
Please take all new medication as prescribed - the antibiotic to finish 10 day course  Please see Dr Smith/sport medicine in this office if your ankle and heel do not clear up  Please continue all other medications as before, and refills have been done if requested.  Please have the pharmacy call with any other refills you may need.  Please keep your appointments with your specialists as you may have planned

## 2017-04-23 NOTE — Progress Notes (Signed)
Subjective:    Patient ID: Sarah Valentine, female    DOB: 1951/03/14, 66 y.o.   MRN: [id number]  HPI  Here after being seen at [facility] June 15; with c/o gradually worsening redness, swelling and soreness, 3/10, on Right anterior lower leg from an abrasion. She reports falling and [place]ins [place] .  Since that time, has been tx with keflex bid with topical mupirocin as well, and overall much improved with erythema,, tender swelling much less but still about1 cm surrounding the right mid pretibial original abrasion site.  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   Past Medical History:  Diagnosis Date  . [place] 2010  . BCC (basal cell carcinoma), trunk 08/2013   removed from back (gould)  . GERD (gastroesophageal reflux disease)   . Hx of adenomatous colonic polyps 12/2008  . Hyperlipidemia   . Hypertension   . 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[place]) 2011   Melanoma in situ (L) buttock, dr Delman Cheadle  . Migraines   . Nephritis, hereditary    familial chronic nephritis  . 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 ABDOMINAL HYSTERECTOMY  1993  . BASAL CELL CARCINOMA EXCISION  08/2013   back  . CHOLECYSTECTOMY  2010  . COLONOSCOPY W/ BIOPSIES AND POLYPECTOMY  12/2008   polyp removed  . MELANOMA EXCISION Left 2011   buttock  . Perinephtric abscess with peritonitis  1980  . SQUAMOUS CELL CARCINOMA EXCISION  01/2014   back    reports that she has never smoked. She has never used smokeless tobacco. She reports that she does not drink alcohol or use drugs. family history includes Arthritis in her father, maternal grandmother, mother, and other; Breast cancer  in her maternal aunt and maternal grandmother; Breast cancer (age of onset: 53) in her mother; Colon cancer (age of onset: 36) in her maternal grandmother; Diabetes in her maternal grandmother and mother; Esophageal cancer in her paternal uncle; Esophageal cancer (age of onset: 52) in her father; Heart disease in her mother; Hyperlipidemia in her mother; Hypertension in her maternal grandmother and mother; Kidney disease in her maternal grandmother, mother, and other. Allergies  Allergen Reactions  . Sulfonamide Derivatives Itching and Rash   Current Outpatient Prescriptions on File Prior to Visit  Medication Sig Dispense Refill  . Cranberry 1000 MG CAPS Take 500 mg by mouth daily.     . mupirocin ointment (BACTROBAN) 2 % Apply to wound 3 times daily for 5 days 30 g 0  . olmesartan (BENICAR) 20 MG tablet Take 0.5 tablets (10 mg total) by mouth daily. 45 tablet 3  . omeprazole (PRILOSEC) 20 MG capsule Take 1 capsule (20 mg total) by mouth daily. 90 capsule 3   No current facility-administered medications on file prior to visit.    Review of Systems  Constitutional: Negative for other unusual diaphoresis or sweats HENT: Negative for ear discharge or swelling Eyes: Negative for other worsening visual disturbances Respiratory: Negative for stridor or other swelling  Gastrointestinal: Negative for worsening distension or other blood Genitourinary: Negative for retention or other urinary change Musculoskeletal: Negative for other  MSK pain or swelling Skin: Negative for color change or other new lesions Neurological: Negative for worsening tremors and other numbness  Psychiatric/Behavioral: Negative for worsening agitation or other fatigue All other system neg per pt    Objective:   Physical Exam BP 118/74   Pulse 82   Ht 5' 3.5" (1.613 m)   Wt 141 lb (64 kg)   SpO2 99%   BMI 24.59 kg/m  VS noted, not ill appearing Constitutional: Pt appears in NAD HENT: Head: NCAT.  Right Ear:  External ear normal.  Left Ear: External ear normal.  Eyes: . Pupils are equal, round, and reactive to light. Conjunctivae and EOM are normal Nose: without d/c or deformity Neck: Neck supple. Gross normal ROM Cardiovascular: Normal rate and regular rhythm.   Pulmonary/Chest: Effort normal and breath sounds without rales or wheezing.  Neurological: Pt is alert. At baseline orientation, motor grossly intact Skin: Skin is warm. No rashes, has 1 cm surrounding the right mid pretibial original abrasion site, no other new lesions, no LE edema Right medial ankle tarsal tunnel tender just post to the medial malleolus, and tender plantar heel, without ulcer, red, swelling Psychiatric: Pt behavior is normal without agitation  No other exam findings Lab Results  Component Value Date   WBC 7.3 03/04/2016   HGB 12.7 03/04/2016   HCT 38.3 03/04/2016   PLT 280.0 03/04/2016   GLUCOSE 88 10/02/2016   CHOL 204 (H) 10/02/2016   TRIG 85.0 10/02/2016   HDL 55.70 10/02/2016   LDLDIRECT 171.3 10/20/2013   LDLCALC 132 (H) 10/02/2016   ALT 18 10/02/2016   AST 22 10/02/2016   NA 142 10/02/2016   K 4.3 10/02/2016   CL 107 10/02/2016   CREATININE 1.44 (H) 10/02/2016   BUN 24 (H) 10/02/2016   CO2 27 10/02/2016   TSH 1.42 07/04/2015   HGBA1C 5.9 10/02/2016   MICROALBUR 42.0 (H) 10/02/2016       Assessment & Plan:

## 2017-05-11 ENCOUNTER — Other Ambulatory Visit (INDEPENDENT_AMBULATORY_CARE_PROVIDER_SITE_OTHER): Payer: BLUE CROSS/BLUE SHIELD

## 2017-05-11 ENCOUNTER — Ambulatory Visit (INDEPENDENT_AMBULATORY_CARE_PROVIDER_SITE_OTHER)
Admission: RE | Admit: 2017-05-11 | Discharge: 2017-05-11 | Disposition: A | Discharge status: Home / Self Care | Payer: BLUE CROSS/BLUE SHIELD | Source: Ambulatory Visit | Attending: Internal Medicine | Admitting: Internal Medicine

## 2017-05-11 ENCOUNTER — Ambulatory Visit (INDEPENDENT_AMBULATORY_CARE_PROVIDER_SITE_OTHER): Payer: BLUE CROSS/BLUE SHIELD | Admitting: Internal Medicine

## 2017-05-11 ENCOUNTER — Encounter: Payer: Self-pay | Admitting: Internal Medicine

## 2017-05-11 DIAGNOSIS — R509 Fever, unspecified: Secondary | ICD-10-CM

## 2017-05-11 LAB — BASIC METABOLIC PANEL
BUN: 28 mg/dL — ABNORMAL HIGH (ref 6–23)
CHLORIDE: 107 meq/L (ref 96–112)
CO2: 24 meq/L (ref 19–32)
CREATININE: 1.59 mg/dL — AB (ref 0.40–1.20)
Calcium: 9.3 mg/dL (ref 8.4–10.5)
GFR: 34.66 mL/min — ABNORMAL LOW (ref >60.00)
Glucose, Bld: 81 mg/dL (ref 70–99)
POTASSIUM: 4.1 meq/L (ref 3.5–5.1)
Sodium: 141 mEq/L (ref 135–145)

## 2017-05-11 LAB — CBC WITH DIFFERENTIAL/PLATELET
BASOS PCT: 1 % (ref 0.0–3.0)
Basophils Absolute: 0.1 10*3/uL (ref 0.0–0.1)
EOS ABS: 0.1 10*3/uL (ref 0.0–0.7)
EOS PCT: 1 % (ref 0.0–5.0)
HCT: 38.1 % (ref 36.0–46.0)
Hemoglobin: 12.8 g/dL (ref 12.0–15.0)
LYMPHS PCT: 39.8 % (ref 12.0–46.0)
Lymphs Abs: 2.5 10*3/uL (ref 0.7–4.0)
MCHC: 33.6 g/dL (ref 30.0–36.0)
MCV: 84.5 fl (ref 78.0–100.0)
Monocytes Absolute: 0.7 10*3/uL (ref 0.1–1.0)
Monocytes Relative: 10.7 % (ref 3.0–12.0)
NEUTROS ABS: 3 10*3/uL (ref 1.4–7.7)
Neutrophils Relative %: 47.5 % (ref 43.0–77.0)
PLATELETS: 304 10*3/uL (ref 150.0–400.0)
RBC: 4.51 Mil/uL (ref 3.87–5.11)
RDW: 13.1 % (ref 11.5–15.5)
WBC: 6.4 10*3/uL (ref 4.0–10.5)

## 2017-05-11 LAB — URINALYSIS, ROUTINE W REFLEX MICROSCOPIC
Bilirubin Urine: NEGATIVE
Ketones, ur: NEGATIVE
Leukocytes, UA: NEGATIVE
Nitrite: NEGATIVE
SPECIFIC GRAVITY, URINE: 1.015 (ref 1.000–1.030)
Total Protein, Urine: 100 — AB
Urine Glucose: NEGATIVE
Urobilinogen, UA: 0.2 (ref 0.0–1.0)
pH: 5.5 (ref 5.0–8.0)

## 2017-05-11 LAB — HEPATIC FUNCTION PANEL
ALBUMIN: 4 g/dL (ref 3.5–5.2)
ALT: 12 U/L (ref 0–35)
AST: 15 U/L (ref 0–37)
Alkaline Phosphatase: 106 U/L (ref 39–117)
BILIRUBIN DIRECT: 0 mg/dL (ref 0.0–0.3)
TOTAL PROTEIN: 7.2 g/dL (ref 6.0–8.3)
Total Bilirubin: 0.3 mg/dL (ref 0.2–1.2)

## 2017-05-11 LAB — POCT RAPID STREP A (OFFICE): Rapid Strep A Screen: NEGATIVE

## 2017-05-11 LAB — SEDIMENTATION RATE: SED RATE: 27 mm/h (ref 0–30)

## 2017-05-11 LAB — TSH: TSH: 1.15 u[IU]/mL (ref 0.35–4.50)

## 2017-05-11 LAB — C-REACTIVE PROTEIN: CRP: 0.2 mg/dL — AB (ref 0.5–20.0)

## 2017-05-11 NOTE — Progress Notes (Signed)
Subjective:  Patient ID: Sarah Valentine, female    DOB: 1950/11/28  Age: 66 y.o. MRN: [id number]  CC: No chief complaint on file.   HPI Sarah Valentine presents for a fever of 100-102.4 over past 12 days - worse. She took Keflex June 15th x 7 d for a R shin infection +3 more days. She started to run fever after Keflex was finished. 2 GD had a strep throat C/o fatigue, chills, nausea. No dental work. No tick bites.  Outpatient Medications Prior to Visit  Medication Sig Dispense Refill  . Cranberry 1000 MG CAPS Take 500 mg by mouth daily.     Marland Kitchen olmesartan (BENICAR) 20 MG tablet Take 0.5 tablets (10 mg total) by mouth daily. 45 tablet 3  . omeprazole (PRILOSEC) 20 MG capsule Take 1 capsule (20 mg total) by mouth daily. 90 capsule 3  . cephALEXin (KEFLEX) 500 MG capsule Take 1 capsule (500 mg total) by mouth 3 (three) times daily. (Patient not taking: Reported on 05/11/2017) 9 capsule 0  . mupirocin ointment (BACTROBAN) 2 % Apply to wound 3 times daily for 5 days (Patient not taking: Reported on 05/11/2017) 30 g 0   No facility-administered medications prior to visit.     ROS Review of Systems  Constitutional: Positive for fatigue and fever. Negative for activity change, appetite change, chills and unexpected weight change.  HENT: Positive for sore throat. Negative for congestion, mouth sores and sinus pressure.   Eyes: Negative for visual disturbance.  Respiratory: Negative for cough and chest tightness.   Gastrointestinal: Positive for nausea. Negative for abdominal pain.  Genitourinary: Negative for difficulty urinating, frequency and vaginal pain.  Musculoskeletal: Negative for back pain and gait problem.  Skin: Negative for pallor and rash.  Neurological: Negative for dizziness, tremors, weakness, numbness and headaches.  Psychiatric/Behavioral: Negative for confusion and sleep disturbance.    Objective:  BP 122/78 (BP Location: Left Arm, Patient Position: Sitting, Cuff Size:  Normal)   Pulse 80   Temp 98.3 F (36.8 C) (Oral)   Ht 5' 3.5" (1.613 m)   Wt 140 lb (63.5 kg)   SpO2 100%   BMI 24.41 kg/m   BP Readings from Last 3 Encounters:  05/11/17 122/78  04/23/17 118/74  04/16/17 128/85    Wt Readings from Last 3 Encounters:  05/11/17 140 lb (63.5 kg)  04/23/17 141 lb (64 kg)  04/16/17 138 lb (62.6 kg)    Physical Exam  Constitutional: She appears well-developed. No distress.  HENT:  Head: Normocephalic.  Right Ear: External ear normal.  Left Ear: External ear normal.  Nose: Nose normal.  Mouth/Throat: Oropharynx is clear and moist.  Eyes: Conjunctivae are normal. Pupils are equal, round, and reactive to light. Right eye exhibits no discharge. Left eye exhibits no discharge.  Neck: Normal range of motion. Neck supple. No JVD present. No tracheal deviation present. No thyromegaly present.  Cardiovascular: Normal rate, regular rhythm and normal heart sounds.   Pulmonary/Chest: No stridor. No respiratory distress. She has no wheezes.  Abdominal: Soft. Bowel sounds are normal. She exhibits no distension and no mass. There is no tenderness. There is no rebound and no guarding.  Musculoskeletal: She exhibits no edema or tenderness.  Lymphadenopathy:    She has no cervical adenopathy.  Neurological: She displays normal reflexes. No cranial nerve deficit. She exhibits normal muscle tone. Coordination normal.  Skin: No rash noted. No erythema.  Psychiatric: She has a normal mood and affect. Her behavior is normal. Judgment and  thought content normal.    Lab Results  Component Value Date   WBC 7.3 03/04/2016   HGB 12.7 03/04/2016   HCT 38.3 03/04/2016   PLT 280.0 03/04/2016   GLUCOSE 88 10/02/2016   CHOL 204 (H) 10/02/2016   TRIG 85.0 10/02/2016   HDL 55.70 10/02/2016   LDLDIRECT 171.3 10/20/2013   LDLCALC 132 (H) 10/02/2016   ALT 18 10/02/2016   AST 22 10/02/2016   NA 142 10/02/2016   K 4.3 10/02/2016   CL 107 10/02/2016   CREATININE 1.44  (H) 10/02/2016   BUN 24 (H) 10/02/2016   CO2 27 10/02/2016   TSH 1.42 07/04/2015   HGBA1C 5.9 10/02/2016   MICROALBUR 42.0 (H) 10/02/2016    No results found.  Assessment & Plan:   There are no diagnoses linked to this encounter. I have discontinued Ms. Barna mupirocin ointment and cephALEXin. I am also having her maintain her Cranberry, olmesartan, and omeprazole.  No orders of the defined types were placed in this encounter.    Follow-up: No Follow-up on file.  Walker Kehr, MD

## 2017-05-11 NOTE — Assessment & Plan Note (Signed)
x 2 weeks ?etiology Labs CXR Tylenol prn RTC 1 week - Dr Quay Burow

## 2017-05-11 NOTE — Patient Instructions (Addendum)
RTC 1 week - Dr Quay Burow Go to ER if worse

## 2017-05-11 NOTE — Addendum Note (Signed)
Addended by: Karren Cobble on: 05/11/2017 10:47 AM   Modules accepted: Orders

## 2017-05-13 LAB — ANTISTREPTOLYSIN O TITER: ASO: 108 [IU]/mL (ref <200)

## 2017-05-18 ENCOUNTER — Encounter: Payer: Self-pay | Admitting: Internal Medicine

## 2017-05-18 ENCOUNTER — Ambulatory Visit (INDEPENDENT_AMBULATORY_CARE_PROVIDER_SITE_OTHER): Payer: BLUE CROSS/BLUE SHIELD | Admitting: Internal Medicine

## 2017-05-18 VITALS — BP 116/80 | HR 80 | Temp 98.2°F | Resp 16 | Ht 63.5 in | Wt 141.0 lb

## 2017-05-18 DIAGNOSIS — R509 Fever, unspecified: Secondary | ICD-10-CM | POA: Diagnosis not present

## 2017-05-18 DIAGNOSIS — K2100 Gastro-esophageal reflux disease with esophagitis, without bleeding: Secondary | ICD-10-CM

## 2017-05-18 DIAGNOSIS — N078 Hereditary nephropathy, not elsewhere classified with other morphologic lesions: Secondary | ICD-10-CM

## 2017-05-18 DIAGNOSIS — I10 Essential (primary) hypertension: Secondary | ICD-10-CM | POA: Diagnosis not present

## 2017-05-18 DIAGNOSIS — L03115 Cellulitis of right lower limb: Secondary | ICD-10-CM | POA: Diagnosis not present

## 2017-05-18 LAB — CULTURE, BLOOD (SINGLE): ORGANISM ID, BACTERIA: NO GROWTH

## 2017-05-18 MED ORDER — OLMESARTAN MEDOXOMIL 20 MG PO TABS: 10.0000 mg | ORAL_TABLET | Freq: Every day | ORAL | 3 refills | Status: DC

## 2017-05-18 MED ORDER — OMEPRAZOLE 20 MG PO CPDR: 20.0000 mg | DELAYED_RELEASE_CAPSULE | Freq: Every day | ORAL | 3 refills | Status: DC

## 2017-05-18 NOTE — Assessment & Plan Note (Signed)
Resolved No obvious cause Follow up as needed

## 2017-05-18 NOTE — Progress Notes (Signed)
Subjective:    Patient ID: Sarah Valentine, female    DOB: [birth year], 66 y.o.   MRN: [id number]  HPI The patient is here for follow up.  She went to urgent care first and was diagnosed with cellulitis and started on keflex. She was seen here after on 04/23/17 for follow up of the cellulitis of the right leg. The keflex was extended.   He was seen 05/11/17 for fevers and chills. She was experiencing fevers at home over the prior 12 days. She started to experience a fever after she completed the antibiotics for her right leg cellulitis. 2 of her granddaughters had strep throat. She was complaining of fatigue, chills, fevers, nausea. There is no obvious cause for her symptoms. A chest x-ray showed no active cardiopulmonary disease. Blood work showed normal CBC, TSH, sedimentation rate, CRP, antistreptolysin titer and liver tests. Her kidney function is decreased, but overall stable. She thinks she was a little dehydrated.  She is here for follow-up today.  She denies fever, chills, fatigue and overall feels well. All of her symptoms went away and she is unsure of the cause.  She has no concerns.   Hypertension: She is taking her medication daily. She is compliant with a low sodium diet.  She denies chest pain, palpitations, edema, shortness of breath and regular headaches. She is exercising regularly.  She does not monitor her blood pressure at home.    GERD:  She is taking her medication as needed sometimes and daily other times.  She denies frequent  GERD symptoms and feels her GERD is well controlled.     Medications and allergies reviewed with patient and updated if appropriate.  Patient Active Problem List   Diagnosis Date Noted  . Fever and chills 05/11/2017  . Cellulitis of leg, right 04/23/2017  . Right ankle pain 04/23/2017  . Pain of right heel 04/23/2017  . Prediabetes 10/07/2016  . Lung nodule, solitary 10/07/2016  . Family history of breast cancer 11/25/2015  . Nephritis,  hereditary 11/14/2014  . Incomplete RBBB   . GERD (gastroesophageal reflux disease)   . Hypertension   . Hyperlipidemia   . 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[place])   . Migraines   . BARRETTS ESOPHAGUS 09/05/2010  . PERSONAL HISTORY OF COLONIC POLYPS 09/05/2010    Current Outpatient Prescriptions on File Prior to Visit  Medication Sig Dispense Refill  . Cranberry 1000 MG CAPS Take 500 mg by mouth daily.     Marland Kitchen olmesartan (BENICAR) 20 MG tablet Take 0.5 tablets (10 mg total) by mouth daily. 45 tablet 3  . omeprazole (PRILOSEC) 20 MG capsule Take 1 capsule (20 mg total) by mouth daily. 90 capsule 3   No current facility-administered medications on file prior to visit.     Past Medical History:  Diagnosis Date  . [place] 2010  . BCC (basal cell carcinoma), trunk 08/2013   removed from back ([facility])  . GERD (gastroesophageal reflux disease)   . Hx of adenomatous colonic polyps 12/2008  . Hyperlipidemia   . Hypertension   . 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[place]) 2011   Melanoma in situ (L) buttock, dr Delman Cheadle  . Migraines   . Nephritis, hereditary    familial chronic nephritis  . 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 ABDOMINAL HYSTERECTOMY  1993  . BASAL CELL CARCINOMA EXCISION  08/2013   back  .  CHOLECYSTECTOMY  2010  . COLONOSCOPY W/ BIOPSIES AND POLYPECTOMY  12/2008   polyp removed  . MELANOMA EXCISION Left 2011   buttock  . Perinephtric abscess with peritonitis  1980  . SQUAMOUS CELL CARCINOMA EXCISION  01/2014   back    Social History   Social History  . Marital status: Married    Spouse name: N/A  . Number of children: N/A  . Years of education: N/A   Occupational History  . nurse [employer]   Social History Main Topics  . Smoking status: Never Smoker  . Smokeless tobacco: Never Used  . Alcohol use No  . Drug use: No  . Sexual activity: Not Asked   Other Topics  Concern  . 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 Breast cancer Mother 68  . Arthritis Mother   . Hyperlipidemia Mother   . Heart disease Mother   . Hypertension Mother   . Kidney disease Mother   . Diabetes Mother   . Arthritis Father   . Esophageal cancer Father 82  . Colon cancer Maternal Grandmother 78  . Arthritis Maternal Grandmother   . Hypertension Maternal Grandmother   . Kidney disease Maternal Grandmother   . Breast cancer Maternal Grandmother   . Diabetes Maternal Grandmother   . Breast cancer Maternal Aunt        x 2 aunt  . Arthritis Other   . Kidney disease Other        [place] nephritis -pt states mostly maternal side of family as the disease  . Esophageal cancer Paternal Uncle   . Rectal cancer Neg Hx   . Stomach cancer Neg Hx   . Pancreatic cancer Neg Hx     Review of Systems  Constitutional: Negative for chills and fever.  Respiratory: Negative for cough, shortness of breath and wheezing.   Cardiovascular: Negative for chest pain and palpitations.  Gastrointestinal: Negative for abdominal pain and diarrhea.  Genitourinary: Negative for dysuria.  Neurological: Negative for light-headedness and headaches.       Objective:   Vitals:   05/18/17 1101  BP: 116/80  Pulse: 80  Resp: 16  Temp: 98.2 F (36.8 C)   Wt Readings from Last 3 Encounters:  05/18/17 141 lb (64 kg)  05/11/17 140 lb (63.5 kg)  04/23/17 141 lb (64 kg)   Body mass index is 24.59 kg/m.   Physical Exam    Constitutional: Appears well-developed and well-nourished. No distress.  HENT:  Head: Normocephalic and atraumatic.  Neck: Neck supple. No tracheal deviation present. No thyromegaly present.  No  cervical lymphadenopathy Cardiovascular: Normal rate, regular rhythm and normal heart sounds.   No murmur heard. No carotid bruit .  No edema Pulmonary/Chest: Effort normal and breath sounds normal. No respiratory distress. No has no wheezes. No rales.  Skin: Skin is warm and dry. Not diaphoretic.  Psychiatric: Normal mood and affect. Behavior is normal.      Assessment & Plan:    See Problem List for Assessment and Plan of chronic medical problems.

## 2017-05-18 NOTE — Assessment & Plan Note (Signed)
GERD controlled Continue daily medication/ as needed medication depending on symptoms

## 2017-05-18 NOTE — Assessment & Plan Note (Signed)
Slightly dec in GFR - she feels she was dehydrated - overall stable Will monitor annually May need nephrology referral

## 2017-05-18 NOTE — Assessment & Plan Note (Signed)
BP well controlled Current regimen effective and well tolerated Continue current medications at current doses  

## 2017-05-18 NOTE — Patient Instructions (Signed)
   Medications reviewed and updated.  No changes recommended at this time.  Your prescription(s) have been submitted to your pharmacy. Please take as directed and contact our office if you believe you are having problem(s) with the medication(s).

## 2017-05-18 NOTE — Assessment & Plan Note (Signed)
Resolved after completing keflex No residual symptoms

## 2017-08-11 DIAGNOSIS — Z23 Encounter for immunization: Secondary | ICD-10-CM | POA: Diagnosis not present

## 2017-08-27 ENCOUNTER — Encounter: Payer: Self-pay | Admitting: Internal Medicine

## 2017-08-27 ENCOUNTER — Ambulatory Visit (INDEPENDENT_AMBULATORY_CARE_PROVIDER_SITE_OTHER): Payer: Medicare Other | Admitting: Internal Medicine

## 2017-08-27 ENCOUNTER — Other Ambulatory Visit: Payer: Medicare Other

## 2017-08-27 VITALS — BP 114/76 | HR 82 | Temp 98.3°F | Resp 16 | Wt 143.0 lb

## 2017-08-27 DIAGNOSIS — R8290 Unspecified abnormal findings in urine: Secondary | ICD-10-CM

## 2017-08-27 DIAGNOSIS — N3000 Acute cystitis without hematuria: Secondary | ICD-10-CM | POA: Diagnosis not present

## 2017-08-27 HISTORY — DX: Acute cystitis without hematuria: N30.00

## 2017-08-27 LAB — POCT URINALYSIS DIPSTICK
Bilirubin, UA: NEGATIVE
Glucose, UA: NEGATIVE
KETONES UA: NEGATIVE
Leukocytes, UA: NEGATIVE
Nitrite, UA: NEGATIVE
PH UA: 6 (ref 5.0–8.0)
SPEC GRAV UA: 1.02 (ref 1.010–1.025)
UROBILINOGEN UA: 0.2 U/dL

## 2017-08-27 MED ORDER — CEPHALEXIN 500 MG PO CAPS: 500.0000 mg | ORAL_CAPSULE | Freq: Two times a day (BID) | ORAL | 0 refills | Status: DC

## 2017-08-27 NOTE — Assessment & Plan Note (Signed)
Urine dip consistent not strongly consistent with UTI, but her symptoms are and given her nephritis it is difficult to tell Will send urine for culture Will start an antibiotic Take tylenol if needed.   Increase your water intake.  Call if no improvement

## 2017-08-27 NOTE — Progress Notes (Signed)
Subjective:    Patient ID: Sarah Valentine, female    DOB: 1951/05/05, 66 y.o.   MRN: [id number]  HPI She is here for an acute visit.   ? UTI:  Her symptoms started a few days ago.    She is experiencing change in urine odor and pressure in her bladder.  She has some dysuria. She has urgency.  There has been and increase in frequency.  She had some back pain last night, but she thinks that is from lifting her grandkids.  She denies fever, nausea and abdominal pain.     She has increased her fluids.  She was not drinking enough water when her symptoms start.   Medications and allergies reviewed with patient and updated if appropriate.  Patient Active Problem List   Diagnosis Date Noted  . Fever and chills 05/11/2017  . Cellulitis of leg, right 04/23/2017  . Right ankle pain 04/23/2017  . Pain of right heel 04/23/2017  . Prediabetes 10/07/2016  . Lung nodule, solitary 10/07/2016  . Family history of breast cancer 11/25/2015  . 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 Incomplete RBBB   . GERD (gastroesophageal reflux disease)   . Hypertension   . Hyperlipidemia   . 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[place])   . Migraines   . BARRETTS ESOPHAGUS 09/05/2010  . PERSONAL HISTORY OF COLONIC POLYPS 09/05/2010    Current Outpatient Prescriptions on File Prior to Visit  Medication Sig Dispense Refill  . Cranberry 1000 MG CAPS Take 500 mg by mouth daily.     Marland Kitchen olmesartan (BENICAR) 20 MG tablet Take 0.5 tablets (10 mg total) by mouth daily. 45 tablet 3  . omeprazole (PRILOSEC) 20 MG capsule Take 1 capsule (20 mg total) by mouth daily. (Patient taking differently: Take 20 mg by mouth daily as needed. ) 90 capsule 3   No current facility-administered medications on file prior to visit.     Past Medical History:  Diagnosis Date  . [place] 2010  . BCC (basal cell carcinoma), trunk 08/2013   removed from back ([facility])  . GERD (gastroesophageal reflux disease)   . Hx of adenomatous colonic  polyps 12/2008  . Hyperlipidemia   . Hypertension   . 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[place]) 2011   Melanoma in situ (L) buttock, dr Delman Cheadle  . Migraines   . Nephritis, hereditary    familial chronic nephritis  . 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 ABDOMINAL HYSTERECTOMY  1993  . BASAL CELL CARCINOMA EXCISION  08/2013   back  . CHOLECYSTECTOMY  2010  . COLONOSCOPY W/ BIOPSIES AND POLYPECTOMY  12/2008   polyp removed  . MELANOMA EXCISION Left 2011   buttock  . Perinephtric abscess with peritonitis  1980  . SQUAMOUS CELL CARCINOMA EXCISION  01/2014   back    Social History   Social History  . Marital status: Married    Spouse name: N/A  . Number of children: N/A  . Years of education: N/A   Occupational History  . nurse [employer]   Social History Main Topics  . Smoking status: Never Smoker  . Smokeless tobacco: Never Used  . Alcohol use No  . Drug use: No  . Sexual activity: Not Asked   Other Topics Concern  . 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 Breast cancer Mother 65  . Arthritis Mother   . Hyperlipidemia Mother   . Heart disease Mother   . Hypertension Mother   . Kidney disease Mother   . Diabetes Mother   . Arthritis Father   . Esophageal cancer Father 2  . Colon cancer Maternal Grandmother 78  . Arthritis Maternal Grandmother   . Hypertension Maternal Grandmother   . Kidney disease Maternal Grandmother   . Breast cancer Maternal Grandmother   . Diabetes Maternal Grandmother   . Breast cancer Maternal Aunt        x 2 aunt  . Arthritis Other   . Kidney disease Other         [place] nephritis -pt states mostly maternal side of family as the disease  . Esophageal cancer Paternal Uncle   . Rectal cancer Neg Hx   . Stomach cancer Neg Hx   . Pancreatic cancer Neg Hx     Review of Systems  Constitutional: Negative for fever.  Gastrointestinal: Negative for abdominal pain and nausea.  Genitourinary: Positive for dysuria, frequency and urgency. Negative for hematuria.       Odor in urine  Neurological: Negative for light-headedness and headaches.       Objective:   Vitals:   08/27/17 1400  BP: 114/76  Pulse: 82  Resp: 16  Temp: 98.3 F (36.8 C)  SpO2: 95%   Filed Weights   08/27/17 1400  Weight: 143 lb (64.9 kg)   Body mass index is 24.93 kg/m.  Wt Readings from Last 3 Encounters:  08/27/17 143 lb (64.9 kg)  05/18/17 141 lb (64 kg)  05/11/17 140 lb (63.5 kg)     Physical Exam  Constitutional: She appears well-developed and well-nourished. No distress.  HENT:  Head: Normocephalic and atraumatic.  Abdominal: Soft. She exhibits no distension and no mass. There is tenderness (minimal tenderness in central lower abdomen). There is no rebound and no guarding.  Genitourinary:  Genitourinary Comments: No cva tenderness  Musculoskeletal: She exhibits no edema.  Skin: Skin is warm and dry. She is not diaphoretic.          Assessment & Plan:   See Problem List for Assessment and Plan of chronic medical problems.

## 2017-08-27 NOTE — Patient Instructions (Signed)
Take the antibiotic as prescribed.  Take tylenol if needed.  Increase your water intake.      Urinary Tract Infection, Adult A urinary tract infection (UTI) is an infection of any part of the urinary tract, which includes the kidneys, ureters, bladder, and urethra. These organs make, store, and get rid of urine in the body. UTI can be a bladder infection (cystitis) or kidney infection (pyelonephritis). What are the causes? This infection may be caused by fungi, viruses, or bacteria. Bacteria are the most common cause of UTIs. This condition can also be caused by repeated incomplete emptying of the bladder during urination. What increases the risk? This condition is more likely to develop if:  You ignore your need to urinate or hold urine for long periods of time.  You do not empty your bladder completely during urination.  You wipe back to front after urinating or having a bowel movement, if you are female.  You are uncircumcised, if you are female.  You are constipated.  You have a urinary catheter that stays in place (indwelling).  You have a weak defense (immune) system.  You have a medical condition that affects your bowels, kidneys, or bladder.  You have diabetes.  You take antibiotic medicines frequently or for long periods of time, and the antibiotics no longer work well against certain types of infections (antibiotic resistance).  You take medicines that irritate your urinary tract.  You are exposed to chemicals that irritate your urinary tract.  You are female.  What are the signs or symptoms? Symptoms of this condition include:  Fever.  Frequent urination or passing small amounts of urine frequently.  Needing to urinate urgently.  Pain or burning with urination.  Urine that smells bad or unusual.  Cloudy urine.  Pain in the lower abdomen or back.  Trouble urinating.  Blood in the urine.  Vomiting or being less hungry than normal.  Diarrhea or  abdominal pain.  Vaginal discharge, if you are female.  How is this diagnosed? This condition is diagnosed with a medical history and physical exam. You will also need to provide a urine sample to test your urine. Other tests may be done, including:  Blood tests.  Sexually transmitted disease (STD) testing.  If you have had more than one UTI, a cystoscopy or imaging studies may be done to determine the cause of the infections. How is this treated? Treatment for this condition often includes a combination of two or more of the following:  Antibiotic medicine.  Other medicines to treat less common causes of UTI.  Over-the-counter medicines to treat pain.  Drinking enough water to stay hydrated.  Follow these instructions at home:  Take over-the-counter and prescription medicines only as told by your health care provider.  If you were prescribed an antibiotic, take it as told by your health care provider. Do not stop taking the antibiotic even if you start to feel better.  Avoid alcohol, caffeine, tea, and carbonated beverages. They can irritate your bladder.  Drink enough fluid to keep your urine clear or pale yellow.  Keep all follow-up visits as told by your health care provider. This is important.  Make sure to: ? Empty your bladder often and completely. Do not hold urine for long periods of time. ? Empty your bladder before and after sex. ? Wipe from front to back after a bowel movement if you are female. Use each tissue one time when you wipe. Contact a health care provider if:  You have back pain.  You have a fever.  You feel nauseous or vomit.  Your symptoms do not get better after 3 days.  Your symptoms go away and then return. Get help right away if:  You have severe back pain or lower abdominal pain.  You are vomiting and cannot keep down any medicines or water. This information is not intended to replace advice given to you by your health care provider.  Make sure you discuss any questions you have with your health care provider. Document Released: 07/29/2005 Document Revised: 04/01/2016 Document Reviewed: 09/09/2015 Elsevier Interactive Patient Education  2017 [employer].

## 2017-08-30 LAB — URINE CULTURE
MICRO NUMBER: 81202731
SPECIMEN QUALITY:: ADEQUATE

## 2017-09-06 ENCOUNTER — Encounter: Payer: Self-pay | Admitting: Gastroenterology

## 2017-09-16 ENCOUNTER — Encounter: Payer: Self-pay | Admitting: Gastroenterology

## 2017-09-21 DIAGNOSIS — Z85828 Personal history of other malignant neoplasm of skin: Secondary | ICD-10-CM | POA: Diagnosis not present

## 2017-09-21 DIAGNOSIS — L821 Other seborrheic keratosis: Secondary | ICD-10-CM | POA: Diagnosis not present

## 2017-09-21 DIAGNOSIS — D171 Benign lipomatous neoplasm of skin and subcutaneous tissue of trunk: Secondary | ICD-10-CM | POA: Diagnosis not present

## 2017-09-21 DIAGNOSIS — Z23 Encounter for immunization: Secondary | ICD-10-CM | POA: Diagnosis not present

## 2017-09-21 DIAGNOSIS — Z808 Family history of malignant neoplasm of other organs or systems: Secondary | ICD-10-CM | POA: Diagnosis not present

## 2017-09-21 DIAGNOSIS — D2272 Melanocytic nevi of left lower limb, including hip: Secondary | ICD-10-CM | POA: Diagnosis not present

## 2017-09-21 DIAGNOSIS — D225 Melanocytic nevi of trunk: Secondary | ICD-10-CM | POA: Diagnosis not present

## 2017-09-21 DIAGNOSIS — Z86018 Personal history of other benign neoplasm: Secondary | ICD-10-CM | POA: Diagnosis not present

## 2017-09-21 DIAGNOSIS — Z86008 Personal history of in-situ neoplasm of other site: Secondary | ICD-10-CM | POA: Diagnosis not present

## 2017-09-21 DIAGNOSIS — L570 Actinic keratosis: Secondary | ICD-10-CM | POA: Diagnosis not present

## 2017-09-28 ENCOUNTER — Ambulatory Visit (INDEPENDENT_AMBULATORY_CARE_PROVIDER_SITE_OTHER): Payer: Medicare Other | Admitting: Family Medicine

## 2017-09-28 ENCOUNTER — Encounter: Payer: Self-pay | Admitting: Family Medicine

## 2017-09-28 VITALS — BP 110/70 | HR 74 | Temp 97.8°F | Ht 63.6 in | Wt 143.0 lb

## 2017-09-28 DIAGNOSIS — J0140 Acute pansinusitis, unspecified: Secondary | ICD-10-CM

## 2017-09-28 MED ORDER — AMOXICILLIN-POT CLAVULANATE 875-125 MG PO TABS: 1.0000 | ORAL_TABLET | Freq: Two times a day (BID) | ORAL | 0 refills | Status: DC

## 2017-09-28 MED ORDER — CETIRIZINE HCL 10 MG PO TABS: 10.0000 mg | ORAL_TABLET | Freq: Every day | ORAL | 11 refills | Status: DC

## 2017-09-28 MED ORDER — FLUTICASONE PROPIONATE 50 MCG/ACT NA SUSP: 2.0000 | Freq: Every day | NASAL | 6 refills | Status: DC

## 2017-09-28 NOTE — Progress Notes (Signed)
Subjective:  I acted as a Education administrator for Dr. Rogue Jury, CMA   Patient ID: Sarah Valentine, female    DOB: 03-16-1951, 66 y.o.   MRN: [id number]  Chief Complaint  Patient presents with  . Facial Pain    Pt states she feels like she's had a bad sinus infection for about a month. Headaches, bad taste in mouth and frontal pain   . Headache  . Nasal Congestion    Some drainage but not much. Taking advil headache and cold medication.     HPI  Patient is in today for a sinus infectionx 2 weeks--- + sinus pressure and teeth pain.  + bad taste in her mouth.  Pt took advil cold and sinus and vick nasal spray with some relief.  No fever.  No cough.    Patient Care Team: Binnie Rail, MD as PCP - General (Internal Medicine) Milus Banister, MD (Gastroenterology) Jerrell Belfast, MD (Otolaryngology) Crissie Reese, MD (Plastic Surgery) Jari Pigg, MD (Dermatology) Neldon Mc, MD (General Surgery)   Past Medical History:  Diagnosis Date  . [place] 2010  . BCC (basal cell carcinoma), trunk 08/2013   removed from back ([facility])  . GERD (gastroesophageal reflux disease)   . Hx of adenomatous colonic polyps 12/2008  . Hyperlipidemia   . Hypertension   . 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 Migraines   . Nephritis, hereditary    familial chronic nephritis  . 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 ABDOMINAL HYSTERECTOMY  1993  . BASAL CELL CARCINOMA EXCISION  08/2013   back  . CHOLECYSTECTOMY  2010  . COLONOSCOPY W/ BIOPSIES AND POLYPECTOMY  12/2008   polyp removed  . MELANOMA EXCISION Left 2011   buttock  . Perinephtric abscess with peritonitis  1980  . SQUAMOUS CELL CARCINOMA EXCISION  01/2014   back    Family History  Problem Relation Age of Onset  . Breast cancer Mother 61  . Arthritis Mother   . Hyperlipidemia Mother   .  Heart disease Mother   . Hypertension Mother   . Kidney disease Mother   . Diabetes Mother   . Arthritis Father   . Esophageal cancer Father 63  . Colon cancer Maternal Grandmother 78  . Arthritis Maternal Grandmother   . Hypertension Maternal Grandmother   . Kidney disease Maternal Grandmother   . Breast cancer Maternal Grandmother   . Diabetes Maternal Grandmother   . Breast cancer Maternal Aunt        x 2 aunt  . Arthritis Other   . Kidney disease Other        [place] nephritis -pt states mostly maternal side of family as the disease  . Esophageal cancer Paternal Uncle   . Rectal cancer Neg Hx   . Stomach cancer Neg Hx   . Pancreatic cancer Neg Hx     Social History   Socioeconomic History  . Marital status: Married    Spouse name: Not on file  . Number of children: Not on file  . Years of education: Not on file  . Highest education level: Not on file  Social Needs  . Financial resource strain: Not on file  . Food insecurity - worry: Not on file  . Food insecurity - inability: Not on file  . Transportation needs -  medical: Not on file  . Transportation needs - non-medical: Not on file  Occupational History  . Occupation: Optician, dispensing: [employer]  Tobacco Use  . Smoking status: Never Smoker  . Smokeless tobacco: Never Used  Substance and Sexual Activity  . Alcohol use: No    Alcohol/week: 0.0 oz  . Drug use: No  . Sexual activity: Not on file  Other Topics Concern  . 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 Cranberry 1000 MG CAPS Take 500 mg by mouth daily.     Marland Kitchen olmesartan (BENICAR) 20 MG tablet Take 0.5 tablets (10 mg total) by mouth  daily. 45 tablet 3  . omeprazole (PRILOSEC) 20 MG capsule Take 1 capsule (20 mg total) by mouth daily. (Patient taking differently: Take 20 mg by mouth daily as needed. ) 90 capsule 3  . cephALEXin (KEFLEX) 500 MG capsule Take 1 capsule (500 mg total) by mouth 2 (two) times daily. (Patient not taking: Reported on 09/28/2017) 10 capsule 0   No facility-administered medications prior to visit.     Allergies  Allergen Reactions  . Sulfonamide Derivatives Itching and Rash    Review of Systems  Constitutional: Positive for chills. Negative for fever and malaise/fatigue.  HENT: Positive for congestion, sinus pain and sore throat. Negative for hearing loss.   Eyes: Negative for discharge.  Respiratory: Negative for cough, sputum production and shortness of breath.   Cardiovascular: Negative for chest pain, palpitations and leg swelling.  Gastrointestinal: Negative for abdominal pain, blood in stool, constipation, diarrhea, heartburn, nausea and vomiting.  Genitourinary: Negative for dysuria, frequency, hematuria and urgency.  Musculoskeletal: Negative for back pain, falls and myalgias.  Skin: Negative for rash.  Neurological: Positive for headaches. Negative for dizziness, sensory change, loss of consciousness and weakness.  Endo/Heme/Allergies: Negative for environmental allergies. Does not bruise/bleed easily.  Psychiatric/Behavioral: Negative for depression and suicidal ideas. The patient is not nervous/anxious and does not have insomnia.        Objective:    Physical Exam  Constitutional: She is oriented to person, place, and time. She appears well-developed and well-nourished.  HENT:  Right Ear: Hearing, tympanic membrane, external ear and ear canal normal.  Left Ear: Hearing, tympanic membrane, external ear and ear canal normal.  Nose: Right sinus exhibits maxillary sinus tenderness and frontal sinus tenderness. Left sinus exhibits maxillary sinus tenderness and frontal sinus  tenderness.  Mouth/Throat: Posterior oropharyngeal erythema present. No oropharyngeal exudate or posterior oropharyngeal edema.  + PND + errythema  Eyes: Conjunctivae are normal. Right eye exhibits no discharge. Left eye exhibits no discharge.  Cardiovascular: Normal rate, regular rhythm and normal heart sounds.  No murmur heard. Pulmonary/Chest: Effort normal and breath sounds normal. No respiratory distress. She has no wheezes. She has no rales. She exhibits no tenderness.  Musculoskeletal: She exhibits no edema.  Lymphadenopathy:    She has cervical adenopathy.  Neurological: She is alert and oriented to person, place, and time.  Nursing note and vitals reviewed.   BP 110/70   Pulse 74   Temp 97.8 F (36.6 C) (Oral)   Ht 5' 3.6" (1.615 m)  Wt 143 lb (64.9 kg)   BMI 24.86 kg/m  Wt Readings from Last 3 Encounters:  09/28/17 143 lb (64.9 kg)  08/27/17 143 lb (64.9 kg)  05/18/17 141 lb (64 kg)   BP Readings from Last 3 Encounters:  09/28/17 110/70  08/27/17 114/76  05/18/17 116/80     Immunization History  Administered Date(s) Administered  . Influenza Split 09/28/2011  . Influenza, Seasonal, Injecte, Preservative Fre 10/21/2012  . Influenza,inj,Quad PF,6+ Mos 08/31/2013, 08/24/2014  . Influenza-Unspecified 08/03/2015, 09/08/2016, 08/11/2017  . Pneumococcal Conjugate-13 11/14/2014  . Td 11/02/2005  . Tdap 12/05/2015  . Zoster 10/21/2012    Health Maintenance  Topic Date Due  . PNA vac Low Risk Adult (2 of 2 - PPSV23) 07/31/2017  . COLONOSCOPY  08/13/2017  . MAMMOGRAM  02/02/2019  . TETANUS/TDAP  12/04/2025  . INFLUENZA VACCINE  Completed  . DEXA SCAN  Completed  . Hepatitis C Screening  Completed  . HIV Screening  Completed    Lab Results  Component Value Date   WBC 6.4 05/11/2017   HGB 12.8 05/11/2017   HCT 38.1 05/11/2017   PLT 304.0 05/11/2017   GLUCOSE 81 05/11/2017   CHOL 204 (H) 10/02/2016   TRIG 85.0 10/02/2016   HDL 55.70 10/02/2016    LDLDIRECT 171.3 10/20/2013   LDLCALC 132 (H) 10/02/2016   ALT 12 05/11/2017   AST 15 05/11/2017   NA 141 05/11/2017   K 4.1 05/11/2017   CL 107 05/11/2017   CREATININE 1.59 (H) 05/11/2017   BUN 28 (H) 05/11/2017   CO2 24 05/11/2017   TSH 1.15 05/11/2017   HGBA1C 5.9 10/02/2016   MICROALBUR 42.0 (H) 10/02/2016    Lab Results  Component Value Date   TSH 1.15 05/11/2017   Lab Results  Component Value Date   WBC 6.4 05/11/2017   HGB 12.8 05/11/2017   HCT 38.1 05/11/2017   MCV 84.5 05/11/2017   PLT 304.0 05/11/2017   Lab Results  Component Value Date   NA 141 05/11/2017   K 4.1 05/11/2017   CO2 24 05/11/2017   GLUCOSE 81 05/11/2017   BUN 28 (H) 05/11/2017   CREATININE 1.59 (H) 05/11/2017   BILITOT 0.3 05/11/2017   ALKPHOS 106 05/11/2017   AST 15 05/11/2017   ALT 12 05/11/2017   PROT 7.2 05/11/2017   ALBUMIN 4.0 05/11/2017   CALCIUM 9.3 05/11/2017   ANIONGAP 2 (L) 12/24/2014   GFR 34.66 (L) 05/11/2017   Lab Results  Component Value Date   CHOL 204 (H) 10/02/2016   Lab Results  Component Value Date   HDL 55.70 10/02/2016   Lab Results  Component Value Date   LDLCALC 132 (H) 10/02/2016   Lab Results  Component Value Date   TRIG 85.0 10/02/2016   Lab Results  Component Value Date   CHOLHDL 4 10/02/2016   Lab Results  Component Value Date   HGBA1C 5.9 10/02/2016         Assessment & Plan:   Problem List Items Addressed This Visit    None    Visit Diagnoses    Acute pansinusitis, recurrence not specified    -  Primary   Relevant Medications   amoxicillin-clavulanate (AUGMENTIN) 875-125 MG tablet   fluticasone (FLONASE) 50 MCG/ACT nasal spray   cetirizine (ZYRTEC) 10 MG tablet        rto prn  I have discontinued Mardene Celeste Helbing's cephALEXin. I am also having her start on amoxicillin-clavulanate, fluticasone, and cetirizine. Additionally, I am having  her maintain her Cranberry, olmesartan, and omeprazole.  Meds ordered this encounter    Medications  . amoxicillin-clavulanate (AUGMENTIN) 875-125 MG tablet    Sig: Take 1 tablet by mouth 2 (two) times daily.    Dispense:  20 tablet    Refill:  0  . fluticasone (FLONASE) 50 MCG/ACT nasal spray    Sig: Place 2 sprays into both nostrils daily.    Dispense:  16 g    Refill:  6  . cetirizine (ZYRTEC) 10 MG tablet    Sig: Take 1 tablet (10 mg total) by mouth daily.    Dispense:  30 tablet    Refill:  11    CMA served as scribe during this visit. History, Physical and Plan performed by medical provider. Documentation and orders reviewed and attested to.  Ann Held, DO

## 2017-09-28 NOTE — Patient Instructions (Signed)

## 2017-11-02 HISTORY — PX: COLONOSCOPY: SHX174

## 2017-11-02 HISTORY — PX: POLYPECTOMY: SHX149

## 2017-11-15 ENCOUNTER — Encounter: Payer: Medicare Other | Admitting: Gastroenterology

## 2017-11-17 ENCOUNTER — Other Ambulatory Visit: Payer: Medicare Other

## 2017-11-17 ENCOUNTER — Encounter: Payer: Self-pay | Admitting: Internal Medicine

## 2017-11-17 ENCOUNTER — Ambulatory Visit (INDEPENDENT_AMBULATORY_CARE_PROVIDER_SITE_OTHER): Payer: Medicare Other | Admitting: Internal Medicine

## 2017-11-17 VITALS — BP 122/84 | HR 84 | Temp 98.0°F | Resp 16 | Wt 141.0 lb

## 2017-11-17 DIAGNOSIS — R8290 Unspecified abnormal findings in urine: Secondary | ICD-10-CM | POA: Diagnosis not present

## 2017-11-17 DIAGNOSIS — N390 Urinary tract infection, site not specified: Secondary | ICD-10-CM | POA: Diagnosis not present

## 2017-11-17 LAB — POCT URINALYSIS DIPSTICK
BILIRUBIN UA: NEGATIVE
GLUCOSE UA: NEGATIVE
Ketones, UA: NEGATIVE
Nitrite, UA: NEGATIVE
Spec Grav, UA: 1.015 (ref 1.010–1.025)
Urobilinogen, UA: 0.2 E.U./dL
pH, UA: 6 (ref 5.0–8.0)

## 2017-11-17 MED ORDER — CEPHALEXIN 500 MG PO CAPS: 500.0000 mg | ORAL_CAPSULE | Freq: Two times a day (BID) | ORAL | 0 refills | Status: DC

## 2017-11-17 NOTE — Patient Instructions (Signed)
Take the antibiotic as prescribed.  Take tylenol if needed.  Increase your water intake.     Urinary Tract Infection, Adult A urinary tract infection (UTI) is an infection of any part of the urinary tract, which includes the kidneys, ureters, bladder, and urethra. These organs make, store, and get rid of urine in the body. UTI can be a bladder infection (cystitis) or kidney infection (pyelonephritis). What are the causes? This infection may be caused by fungi, viruses, or bacteria. Bacteria are the most common cause of UTIs. This condition can also be caused by repeated incomplete emptying of the bladder during urination. What increases the risk? This condition is more likely to develop if:  You ignore your need to urinate or hold urine for long periods of time.  You do not empty your bladder completely during urination.  You wipe back to front after urinating or having a bowel movement, if you are female.  You are uncircumcised, if you are female.  You are constipated.  You have a urinary catheter that stays in place (indwelling).  You have a weak defense (immune) system.  You have a medical condition that affects your bowels, kidneys, or bladder.  You have diabetes.  You take antibiotic medicines frequently or for long periods of time, and the antibiotics no longer work well against certain types of infections (antibiotic resistance).  You take medicines that irritate your urinary tract.  You are exposed to chemicals that irritate your urinary tract.  You are female.  What are the signs or symptoms? Symptoms of this condition include:  Fever.  Frequent urination or passing small amounts of urine frequently.  Needing to urinate urgently.  Pain or burning with urination.  Urine that smells bad or unusual.  Cloudy urine.  Pain in the lower abdomen or back.  Trouble urinating.  Blood in the urine.  Vomiting or being less hungry than normal.  Diarrhea or  abdominal pain.  Vaginal discharge, if you are female.  How is this diagnosed? This condition is diagnosed with a medical history and physical exam. You will also need to provide a urine sample to test your urine. Other tests may be done, including:  Blood tests.  Sexually transmitted disease (STD) testing.  If you have had more than one UTI, a cystoscopy or imaging studies may be done to determine the cause of the infections. How is this treated? Treatment for this condition often includes a combination of two or more of the following:  Antibiotic medicine.  Other medicines to treat less common causes of UTI.  Over-the-counter medicines to treat pain.  Drinking enough water to stay hydrated.  Follow these instructions at home:  Take over-the-counter and prescription medicines only as told by your health care provider.  If you were prescribed an antibiotic, take it as told by your health care provider. Do not stop taking the antibiotic even if you start to feel better.  Avoid alcohol, caffeine, tea, and carbonated beverages. They can irritate your bladder.  Drink enough fluid to keep your urine clear or pale yellow.  Keep all follow-up visits as told by your health care provider. This is important.  Make sure to: ? Empty your bladder often and completely. Do not hold urine for long periods of time. ? Empty your bladder before and after sex. ? Wipe from front to back after a bowel movement if you are female. Use each tissue one time when you wipe. Contact a health care provider if:  You  have back pain.  You have a fever.  You feel nauseous or vomit.  Your symptoms do not get better after 3 days.  Your symptoms go away and then return. Get help right away if:  You have severe back pain or lower abdominal pain.  You are vomiting and cannot keep down any medicines or water. This information is not intended to replace advice given to you by your health care provider.  Make sure you discuss any questions you have with your health care provider. Document Released: 07/29/2005 Document Revised: 04/01/2016 Document Reviewed: 09/09/2015 Elsevier Interactive Patient Education  [employer].

## 2017-11-17 NOTE — Progress Notes (Signed)
Subjective:    Patient ID: Sarah Valentine, female    DOB: 21-Apr-1951, 67 y.o.   MRN: [id number]  HPI She is here for an acute visit.   ? UTI:  She thinks she may have a UTI.  She has had symptoms for about two weeks.  She is experiencing cloudy urine, urinary frequency and urgency and some dysuria.   She denies fever, chills, abdominal pain, nausea and back pain.   Medications and allergies reviewed with patient and updated if appropriate.  Patient Active Problem List   Diagnosis Date Noted  . Acute cystitis without hematuria 08/27/2017  . Fever and chills 05/11/2017  . Cellulitis of leg, right 04/23/2017  . Right ankle pain 04/23/2017  . Pain of right heel 04/23/2017  . Prediabetes 10/07/2016  . Lung nodule, solitary 10/07/2016  . Family history of breast cancer 11/25/2015  . 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 Incomplete RBBB   . GERD (gastroesophageal reflux disease)   . Hypertension   . Hyperlipidemia   . 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[place])   . Migraines   . BARRETTS ESOPHAGUS 09/05/2010  . PERSONAL HISTORY OF COLONIC POLYPS 09/05/2010    Current Outpatient Medications on File Prior to Visit  Medication Sig Dispense Refill  . Cranberry 1000 MG CAPS Take 500 mg by mouth daily.     Marland Kitchen olmesartan (BENICAR) 20 MG tablet Take 0.5 tablets (10 mg total) by mouth daily. 45 tablet 3  . omeprazole (PRILOSEC) 20 MG capsule Take 1 capsule (20 mg total) by mouth daily. (Patient taking differently: Take 20 mg by mouth daily as needed. ) 90 capsule 3   No current facility-administered medications on file prior to visit.     Past Medical History:  Diagnosis Date  . [place] 2010  . BCC (basal cell carcinoma), trunk 08/2013   removed from back ([facility])  . GERD (gastroesophageal reflux disease)   . Hx of adenomatous colonic polyps 12/2008  . Hyperlipidemia   . Hypertension   . 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 Migraines   . Nephritis,  hereditary    familial chronic nephritis  . 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 ABDOMINAL HYSTERECTOMY  1993  . BASAL CELL CARCINOMA EXCISION  08/2013   back  . CHOLECYSTECTOMY  2010  . COLONOSCOPY W/ BIOPSIES AND POLYPECTOMY  12/2008   polyp removed  . MELANOMA EXCISION Left 2011   buttock  . Perinephtric abscess with peritonitis  1980  . SQUAMOUS CELL CARCINOMA EXCISION  01/2014   back    Social History   Socioeconomic History  . Marital status: Married    Spouse name: Not on file  . Number of children: Not on file  . Years of education: Not on file  . Highest education level: Not on file  Social Needs  . Financial resource strain: Not on file  . Food insecurity - worry: Not on file  . Food insecurity - inability: Not on file  . Transportation needs - medical: Not on file  . Transportation needs - non-medical: Not on file  Occupational History  . Occupation: Optician, dispensing: [employer]  Tobacco Use  . Smoking status: Never Smoker  . Smokeless tobacco: Never Used  Substance and Sexual Activity  . Alcohol use: No    Alcohol/week:  0.0 oz  . Drug use: No  . Sexual activity: Not on file  Other Topics Concern  . 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 Breast cancer Mother 5  . Arthritis Mother   . Hyperlipidemia Mother   . Heart disease Mother   . Hypertension Mother   . Kidney disease Mother   . Diabetes Mother   . Arthritis Father   . Esophageal cancer Father 70  . Colon cancer Maternal Grandmother 78  . Arthritis Maternal Grandmother   . Hypertension Maternal  Grandmother   . Kidney disease Maternal Grandmother   . Breast cancer Maternal Grandmother   . Diabetes Maternal Grandmother   . Breast cancer Maternal Aunt        x 2 aunt  . Arthritis Other   . Kidney disease Other        [place] nephritis -pt states mostly maternal side of family as the disease  . Esophageal cancer Paternal Uncle   . Rectal cancer Neg Hx   . Stomach cancer Neg Hx   . Pancreatic cancer Neg Hx     Review of Systems  Constitutional: Negative for fever.  Gastrointestinal: Negative for abdominal pain and nausea.  Genitourinary: Positive for dysuria, frequency and urgency. Negative for hematuria.       Cloudy urine  Musculoskeletal: Negative for back pain.       Objective:   Vitals:   11/17/17 1325  BP: 122/84  Pulse: 84  Resp: 16  Temp: 98 F (36.7 C)  SpO2: 98%   Wt Readings from Last 3 Encounters:  11/17/17 141 lb (64 kg)  09/28/17 143 lb (64.9 kg)  08/27/17 143 lb (64.9 kg)   Body mass index is 24.51 kg/m.   Physical Exam    Constitutional: Appears well-developed and well-nourished. No distress.  Abdomen: soft, non tender, non distended Back, GU:  No CVA tenderness Skin: Skin is warm and dry. Not diaphoretic.     Assessment & Plan:    See Problem List for Assessment and Plan of chronic medical problems.

## 2017-11-17 NOTE — Assessment & Plan Note (Addendum)
Urine dip consistent with UTI Will send urine for culture Take the antibiotic as prescribed - keflex BID x 5 days.   Take tylenol if needed.   Increase your water intake.  Call if no improvement

## 2017-11-18 LAB — URINE CULTURE
MICRO NUMBER:: 90066407
SPECIMEN QUALITY: ADEQUATE

## 2017-11-19 ENCOUNTER — Encounter: Payer: Self-pay | Admitting: Internal Medicine

## 2017-11-29 ENCOUNTER — Ambulatory Visit (AMBULATORY_SURGERY_CENTER): Payer: Self-pay | Admitting: *Deleted

## 2017-11-29 ENCOUNTER — Other Ambulatory Visit: Payer: Self-pay

## 2017-11-29 VITALS — Ht 63.6 in | Wt 144.0 lb

## 2017-11-29 DIAGNOSIS — Z86010 Personal history of colonic polyps: Secondary | ICD-10-CM

## 2017-11-29 MED ORDER — NA SULFATE-K SULFATE-MG SULF 17.5-3.13-1.6 GM/177ML PO SOLN: 1.0000 | Freq: Once | ORAL | 0 refills | Status: AC

## 2017-11-29 NOTE — Progress Notes (Signed)
Patient denies any allergies to eggs or soy. Patient denies any problems with anesthesia/sedation. Patient denies any oxygen use at home. Patient denies taking any diet/weight loss medications or blood thinners. EMMI education declined by pt. Patient request to do low volume prep she had hard time getting the prep down last time. Suprep given to patient, explained that she will have out of pocket expense with Suprep. Pt ok with that.

## 2017-12-13 ENCOUNTER — Ambulatory Visit (AMBULATORY_SURGERY_CENTER): Payer: Medicare Other | Admitting: Gastroenterology

## 2017-12-13 ENCOUNTER — Encounter: Payer: Self-pay | Admitting: Gastroenterology

## 2017-12-13 ENCOUNTER — Other Ambulatory Visit: Payer: Self-pay

## 2017-12-13 VITALS — BP 110/88 | HR 102 | Temp 98.4°F | Resp 15 | Ht 63.6 in | Wt 144.0 lb

## 2017-12-13 DIAGNOSIS — Z86010 Personal history of colonic polyps: Secondary | ICD-10-CM

## 2017-12-13 DIAGNOSIS — K5730 Diverticulosis of large intestine without perforation or abscess without bleeding: Secondary | ICD-10-CM | POA: Diagnosis not present

## 2017-12-13 DIAGNOSIS — D128 Benign neoplasm of rectum: Secondary | ICD-10-CM

## 2017-12-13 DIAGNOSIS — K621 Rectal polyp: Secondary | ICD-10-CM

## 2017-12-13 DIAGNOSIS — D125 Benign neoplasm of sigmoid colon: Secondary | ICD-10-CM

## 2017-12-13 DIAGNOSIS — D124 Benign neoplasm of descending colon: Secondary | ICD-10-CM

## 2017-12-13 DIAGNOSIS — D122 Benign neoplasm of ascending colon: Secondary | ICD-10-CM | POA: Diagnosis not present

## 2017-12-13 DIAGNOSIS — I10 Essential (primary) hypertension: Secondary | ICD-10-CM | POA: Diagnosis not present

## 2017-12-13 MED ORDER — SODIUM CHLORIDE 0.9 % IV SOLN: 500.0000 mL | Freq: Once | INTRAVENOUS | Status: DC

## 2017-12-13 NOTE — Op Note (Signed)
[facility] Patient Name: Sarah Valentine Procedure Date: 12/13/2017 8:54 AM MRN: [id number] Endoscopist: Milus Banister , MD Age: 67 Referring MD:  Date of Birth: 1951/09/26 Gender: Female Account #: [id number] Procedure:                Colonoscopy Indications:              High risk colon cancer surveillance: Personal                            history of colonic polyps; 76mm adenomatous polyp                            removed 2010 in [place]. Colonoscopy 2015 Dr.                            Ardis Hughs found 5 subCM polyps (two were TAs, one was                            SSP) Medicines:                Monitored Anesthesia Care Procedure:                Pre-Anesthesia Assessment:                           - Prior to the procedure, a History and Physical                            was performed, and patient medications and                            allergies were reviewed. The patient's tolerance of                            previous anesthesia was also reviewed. The risks                            and benefits of the procedure and the sedation                            options and risks were discussed with the patient.                            All questions were answered, and informed consent                            was obtained. Prior Anticoagulants: The patient has                            taken no previous anticoagulant or antiplatelet                            agents. ASA Grade Assessment: II - A patient with  mild systemic disease. After reviewing the risks                            and benefits, the patient was deemed in                            satisfactory condition to undergo the procedure.                           After obtaining informed consent, the colonoscope                            was passed under direct vision. Throughout the                            procedure, the patient's blood pressure, pulse, and                  oxygen saturations were monitored continuously. The                            Model PCF-[id number]) scope was introduced                            through the anus and advanced to the the cecum,                            identified by appendiceal orifice and ileocecal                            valve. The colonoscopy was performed without                            difficulty. The patient tolerated the procedure                            well. The quality of the bowel preparation was                            good. The ileocecal valve, appendiceal orifice, and                            rectum were photographed. Scope In: 9:00:09 AM Scope Out: 9:15:46 AM Scope Withdrawal Time: 0 hours 12 minutes 20 seconds  Total Procedure Duration: 0 hours 15 minutes 37 seconds  Findings:                 Five sessile polyps were found in the rectum,                            sigmoid colon, descending colon and ascending                            colon. The polyps were 2 to 3 mm in size. These  polyps were removed with a cold snare. Resection                            and retrieval were complete.                           Multiple small-mouthed diverticula were found in                            the left colon.                           The exam was otherwise without abnormality on                            direct and retroflexion views. Complications:            No immediate complications. Estimated blood loss:                            None. Estimated Blood Loss:     Estimated blood loss: none. Impression:               - Five 2 to 3 mm polyps in the rectum, in the                            sigmoid colon, in the descending colon and in the                            ascending colon, removed with a cold snare.                            Resected and retrieved.                           - Diverticulosis in the left colon.                            - The examination was otherwise normal on direct                            and retroflexion views. Recommendation:           - Patient has a contact number available for                            emergencies. The signs and symptoms of potential                            delayed complications were discussed with the                            patient. Return to normal activities tomorrow.                            Written discharge instructions were provided to the  patient.                           - Resume previous diet.                           - Continue present medications.                           You will receive a letter within 2-3 weeks with the                            pathology results and my final recommendations.                           If the polyp(s) is proven to be 'pre-cancerous' on                            pathology, you will need repeat colonoscopy in 3-5                            years. Milus Banister, MD 12/13/2017 9:19:21 AM This report has been signed electronically.

## 2017-12-13 NOTE — Patient Instructions (Signed)
Handouts given for polyps, diverticulosis.  YOU HAD AN ENDOSCOPIC PROCEDURE TODAY AT [facility]:   Refer to the procedure report that was given to you for any specific questions about what was found during the examination.  If the procedure report does not answer your questions, please call your gastroenterologist to clarify.  If you requested that your care partner not be given the details of your procedure findings, then the procedure report has been included in a sealed envelope for you to review at your convenience later.  YOU SHOULD EXPECT: Some feelings of bloating in the abdomen. Passage of more gas than usual.  Walking can help get rid of the air that was put into your GI tract during the procedure and reduce the bloating. If you had a lower endoscopy (such as a colonoscopy or flexible sigmoidoscopy) you may notice spotting of blood in your stool or on the toilet paper. If you underwent a bowel prep for your procedure, you may not have a normal bowel movement for a few days.  Please Note:  You might notice some irritation and congestion in your nose or some drainage.  This is from the oxygen used during your procedure.  There is no need for concern and it should clear up in a day or so.  SYMPTOMS TO REPORT IMMEDIATELY:   Following lower endoscopy (colonoscopy or flexible sigmoidoscopy):  Excessive amounts of blood in the stool  Significant tenderness or worsening of abdominal pains  Swelling of the abdomen that is new, acute  Fever of 100F or higher   For urgent or emergent issues, a gastroenterologist can be reached at any hour by calling [phone].   DIET:  We do recommend a small meal at first, but then you may proceed to your regular diet.  Drink plenty of fluids but you should avoid alcoholic beverages for 24 hours.  ACTIVITY:  You should plan to take it easy for the rest of today and you should NOT DRIVE or use heavy machinery until tomorrow (because of  the sedation medicines used during the test).    FOLLOW UP: Our staff will call the number listed on your records the next business day following your procedure to check on you and address any questions or concerns that you may have regarding the information given to you following your procedure. If we do not reach you, we will leave a message.  However, if you are feeling well and you are not experiencing any problems, there is no need to return our call.  We will assume that you have returned to your regular daily activities without incident.  If any biopsies were taken you will be contacted by phone or by letter within the next 1-3 weeks.  Please call us at [phone] if you have not heard about the biopsies in 3 weeks.    SIGNATURES/CONFIDENTIALITY: You and/or your care partner have signed paperwork which will be entered into your electronic medical record.  These signatures attest to the fact that that the information above on your After Visit Summary has been reviewed and is understood.  Full responsibility of the confidentiality of this discharge information lies with you and/or your care-partner.

## 2017-12-13 NOTE — Progress Notes (Signed)
Called to room to assist during endoscopic procedure.  Patient ID and intended procedure confirmed with present staff. Received instructions for my participation in the procedure from the performing physician.  

## 2017-12-13 NOTE — Progress Notes (Signed)
Report given to PACU, vss 

## 2017-12-14 ENCOUNTER — Telehealth: Payer: Self-pay | Admitting: *Deleted

## 2017-12-14 NOTE — Telephone Encounter (Signed)
  Follow up Call-  Call back number 12/13/2017  Post procedure Call Back phone  # [phone]  Permission to leave phone message Yes  Some recent data might be hidden     Patient questions:  Do you have a fever, pain , or abdominal swelling? No. Pain Score  0 *  Have you tolerated food without any problems? Yes.    Have you been able to return to your normal activities? Yes.    Do you have any questions about your discharge instructions: Diet   No. Medications  No. Follow up visit  No.  Do you have questions or concerns about your Care? No.  Actions: * If pain score is 4 or above: No action needed, pain <4.

## 2017-12-16 ENCOUNTER — Telehealth: Payer: Self-pay | Admitting: Gastroenterology

## 2017-12-16 ENCOUNTER — Encounter: Payer: Self-pay | Admitting: Internal Medicine

## 2017-12-16 DIAGNOSIS — R8290 Unspecified abnormal findings in urine: Secondary | ICD-10-CM

## 2017-12-16 NOTE — Telephone Encounter (Signed)
The pt had a colon on 12/13/17 and 2/13 began running a fever 100-100.5.  She has back pain and aching.  No rectal bleeding.  She says she does feel like she may have a UTI.  She is going to call her PCP and get evaluated, if they do not think it is a UTI she will call us back to be seen

## 2017-12-17 ENCOUNTER — Encounter: Payer: Self-pay | Admitting: Gastroenterology

## 2017-12-17 ENCOUNTER — Other Ambulatory Visit: Payer: Self-pay | Admitting: Emergency Medicine

## 2017-12-17 ENCOUNTER — Other Ambulatory Visit: Payer: Self-pay | Admitting: Internal Medicine

## 2017-12-17 ENCOUNTER — Other Ambulatory Visit (INDEPENDENT_AMBULATORY_CARE_PROVIDER_SITE_OTHER): Payer: Medicare Other

## 2017-12-17 DIAGNOSIS — R8290 Unspecified abnormal findings in urine: Secondary | ICD-10-CM

## 2017-12-17 LAB — URINALYSIS, ROUTINE W REFLEX MICROSCOPIC
Bilirubin Urine: NEGATIVE
Ketones, ur: NEGATIVE
NITRITE: POSITIVE — AB
Specific Gravity, Urine: 1.015 (ref 1.000–1.030)
TOTAL PROTEIN, URINE-UPE24: 100 — AB
URINE GLUCOSE: NEGATIVE
Urobilinogen, UA: 0.2 (ref 0.0–1.0)
pH: 5.5 (ref 5.0–8.0)

## 2017-12-17 MED ORDER — CIPROFLOXACIN HCL 250 MG PO TABS: 250.0000 mg | ORAL_TABLET | Freq: Two times a day (BID) | ORAL | 0 refills | Status: DC

## 2017-12-19 LAB — URINE CULTURE
MICRO NUMBER:: 90205370
SPECIMEN QUALITY: ADEQUATE

## 2017-12-20 ENCOUNTER — Encounter: Payer: Self-pay | Admitting: Internal Medicine

## 2018-01-26 ENCOUNTER — Other Ambulatory Visit: Payer: Self-pay | Admitting: Internal Medicine

## 2018-01-26 DIAGNOSIS — Z1231 Encounter for screening mammogram for malignant neoplasm of breast: Secondary | ICD-10-CM

## 2018-03-03 ENCOUNTER — Ambulatory Visit
Admission: RE | Admit: 2018-03-03 | Discharge: 2018-03-03 | Disposition: A | Discharge status: Home / Self Care | Payer: Medicare Other | Source: Ambulatory Visit | Attending: Internal Medicine | Admitting: Internal Medicine

## 2018-03-03 DIAGNOSIS — Z1231 Encounter for screening mammogram for malignant neoplasm of breast: Secondary | ICD-10-CM | POA: Diagnosis not present

## 2018-05-02 ENCOUNTER — Encounter: Payer: Self-pay | Admitting: Internal Medicine

## 2018-05-12 ENCOUNTER — Other Ambulatory Visit: Payer: Medicare Other

## 2018-05-12 ENCOUNTER — Ambulatory Visit (INDEPENDENT_AMBULATORY_CARE_PROVIDER_SITE_OTHER): Payer: Medicare Other | Admitting: Family

## 2018-05-12 ENCOUNTER — Encounter: Payer: Self-pay | Admitting: Family

## 2018-05-12 VITALS — BP 116/78 | HR 80 | Temp 97.7°F | Ht 63.6 in | Wt 142.1 lb

## 2018-05-12 DIAGNOSIS — R300 Dysuria: Secondary | ICD-10-CM

## 2018-05-12 LAB — POC URINALSYSI DIPSTICK (AUTOMATED)
Bilirubin, UA: NEGATIVE
GLUCOSE UA: NEGATIVE
Ketones, UA: NEGATIVE
LEUKOCYTES UA: NEGATIVE
NITRITE UA: NEGATIVE
Protein, UA: POSITIVE — AB
Spec Grav, UA: 1.015 (ref 1.010–1.025)
Urobilinogen, UA: 0.2 E.U./dL
pH, UA: 6 (ref 5.0–8.0)

## 2018-05-12 MED ORDER — CIPROFLOXACIN HCL 250 MG PO TABS: 250.0000 mg | ORAL_TABLET | Freq: Two times a day (BID) | ORAL | 0 refills | Status: DC

## 2018-05-12 NOTE — Progress Notes (Signed)
Sarah Valentine is a 67 y.o. female with the following history as recorded in [employer]:  Patient Active Problem List   Diagnosis Date Noted  . Urinary tract infection without hematuria 11/17/2017  . Fever and chills 05/11/2017  . Cellulitis of leg, right 04/23/2017  . Right ankle pain 04/23/2017  . Pain of right heel 04/23/2017  . Prediabetes 10/07/2016  . Lung nodule, solitary 10/07/2016  . Family history of breast cancer 11/25/2015  . 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 Incomplete RBBB   . GERD (gastroesophageal reflux disease)   . Hypertension   . Hyperlipidemia   . 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[place])   . Migraines   . BARRETTS ESOPHAGUS 09/05/2010  . PERSONAL HISTORY OF COLONIC POLYPS 09/05/2010    Current Outpatient Medications  Medication Sig Dispense Refill  . Cranberry 1000 MG CAPS Take 500 mg by mouth daily.     Marland Kitchen olmesartan (BENICAR) 20 MG tablet Take 0.5 tablets (10 mg total) by mouth daily. 45 tablet 3  . ciprofloxacin (CIPRO) 250 MG tablet Take 1 tablet (250 mg total) by mouth 2 (two) times daily. 10 tablet 0  . omeprazole (PRILOSEC) 20 MG capsule Take 1 capsule (20 mg total) by mouth daily. (Patient taking differently: Take 20 mg by mouth daily as needed. ) 90 capsule 3   No current facility-administered medications for this visit.     Allergies: Sulfonamide derivatives  Past Medical History:  Diagnosis Date  . [place] 2010  . BCC (basal cell carcinoma), trunk 08/2013   removed from back ([facility])  . GERD (gastroesophageal reflux disease)   . Hx of adenomatous colonic polyps 12/2008  . Hyperlipidemia   . Hypertension   . 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 Migraines   . Nephritis, hereditary    familial chronic nephritis  . 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 ABDOMINAL HYSTERECTOMY  1993  . BASAL CELL CARCINOMA EXCISION   08/2013   back  . CHOLECYSTECTOMY  2010  . COLONOSCOPY W/ BIOPSIES AND POLYPECTOMY  12/2008   polyp removed  . MELANOMA EXCISION Left 2011   buttock  . Perinephtric abscess with peritonitis  1980  . SQUAMOUS CELL CARCINOMA EXCISION  01/2014   back    Family History  Problem Relation Age of Onset  . Breast cancer Mother 11  . Arthritis Mother   . Hyperlipidemia Mother   . Heart disease Mother   . Hypertension Mother   . Kidney disease Mother   . Diabetes Mother   . Arthritis Father   . Esophageal cancer Father 32  . Colon cancer Maternal Grandmother 78  . Arthritis Maternal Grandmother   . Hypertension Maternal Grandmother   . Kidney disease Maternal Grandmother   . Breast cancer Maternal Grandmother   . Diabetes Maternal Grandmother   . Breast cancer Maternal Aunt        x 2 aunt  . Arthritis Other   . Kidney disease Other        [place] nephritis -pt states mostly maternal side of family as the disease  . Esophageal cancer Paternal Uncle   . Rectal cancer Neg Hx   . Stomach cancer Neg Hx   . Pancreatic cancer Neg Hx     Social History   Tobacco Use  . Smoking status: Never Smoker  . Smokeless tobacco:  Never Used  Substance Use Topics  . Alcohol use: No    Alcohol/week: 0.0 oz    Subjective:  Patient presents with concerns for UTI; symptoms x 4 weeks; has tried to manage with OTC cranberry pills; describes urine as cloudy/ "strong smelling." is prone to recurrent UTIs; denies any fever or back pain;   Objective:  Vitals:   05/12/18 1043  BP: 116/78  Pulse: 80  Temp: 97.7 F (36.5 C)  TempSrc: Oral  SpO2: 98%  Weight: 142 lb 1.3 oz (64.4 kg)  Height: 5' 3.6" (1.615 m)    General: Well developed, well nourished, in no acute distress  Skin : Warm and dry.  Head: Normocephalic and atraumatic  Lungs: Respirations unlabored; clear to auscultation bilaterally without wheeze, rales, rhonchi  CVS exam: normal rate and regular rhythm.  Neurologic: Alert and  oriented; speech intact; face symmetrical; moves all extremities well; CNII-XII intact without focal deficit  Assessment:  1. Dysuria     Plan:  Suspect UTI; update U/A and urine culture; Rx for Cipro 250 mg bid x 5 days; increase fluids, rest and follow-up worse, no better.  She will plan to talk to her PCP at next follow-up about frequency of UTIs- may benefit from antibiotic prophylaxis or urology consult.   No follow-ups on file.  Orders Placed This Encounter  Procedures  . Urine Culture    Standing Status:   Future    Number of Occurrences:   1    Standing Expiration Date:   05/12/2019  . POCT Urinalysis Dipstick (Automated)    Requested Prescriptions   Signed Prescriptions Disp Refills  . ciprofloxacin (CIPRO) 250 MG tablet 10 tablet 0    Sig: Take 1 tablet (250 mg total) by mouth 2 (two) times daily.

## 2018-05-14 LAB — URINE CULTURE
MICRO NUMBER:: 90822515
SPECIMEN QUALITY:: ADEQUATE

## 2018-07-18 ENCOUNTER — Other Ambulatory Visit: Payer: Self-pay | Admitting: Internal Medicine

## 2018-07-24 NOTE — Progress Notes (Signed)
Subjective:    Patient ID: Sarah Valentine, female    DOB: July 25, 1951, 67 y.o.   MRN: [id number]  HPI The patient is here for an acute visit.   Left foot injury:  One week ago she fell down 1-2 stairs at night while in [place].  The left foot was swollen, painful and initially discolored.  She iced it for 48 hours and took Tylenol and Advil.  Of her symptoms have improved, but she is still having pain in the foot wanted to make sure she did not fracture it.  Her pain is currently controlled with her current regimen.  She is able to ambulate long as she wears a tennis shoe.  She denies any numbness, tingling.  She has pain with movement of the toes and with applying pressure or putting her shoe on.    Medications and allergies reviewed with patient and updated if appropriate.  Patient Active Problem List   Diagnosis Date Noted  . Urinary tract infection without hematuria 11/17/2017  . Fever and chills 05/11/2017  . Cellulitis of leg, right 04/23/2017  . Right ankle pain 04/23/2017  . Pain of right heel 04/23/2017  . Prediabetes 10/07/2016  . Lung nodule, solitary 10/07/2016  . Family history of breast cancer 11/25/2015  . 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 Incomplete RBBB   . GERD (gastroesophageal reflux disease)   . Hypertension   . Hyperlipidemia   . 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[place])   . Migraines   . BARRETTS ESOPHAGUS 09/05/2010  . PERSONAL HISTORY OF COLONIC POLYPS 09/05/2010    Current Outpatient Medications on File Prior to Visit  Medication Sig Dispense Refill  . Cranberry 1000 MG CAPS Take 500 mg by mouth daily.     Marland Kitchen olmesartan (BENICAR) 20 MG tablet Take one-half tablet daily. -- Office visit needed for further refills 45 tablet 0  . omeprazole (PRILOSEC) 20 MG capsule Take 1 capsule (20 mg total) by mouth daily. (Patient taking differently: Take 20 mg by mouth daily as needed. ) 90 capsule 3   No current facility-administered medications on file prior to visit.      Past Medical History:  Diagnosis Date  . [place] 2010  . BCC (basal cell carcinoma), trunk 08/2013   removed from back ([facility])  . GERD (gastroesophageal reflux disease)   . Hx of adenomatous colonic polyps 12/2008  . Hyperlipidemia   . Hypertension   . 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 Migraines   . Nephritis, hereditary    familial chronic nephritis  . 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 ABDOMINAL HYSTERECTOMY  1993  . BASAL CELL CARCINOMA EXCISION  08/2013   back  . CHOLECYSTECTOMY  2010  . COLONOSCOPY W/ BIOPSIES AND POLYPECTOMY  12/2008   polyp removed  . MELANOMA EXCISION Left 2011   buttock  . Perinephtric abscess with peritonitis  1980  . SQUAMOUS CELL CARCINOMA EXCISION  01/2014   back    Social History   Socioeconomic History  . Marital status: Married    Spouse name: Not on file  . Number of children: Not on file  . Years of education: Not on file  . Highest education level: Not on file  Occupational History  . Occupation: Optician, dispensing: [employer]  .  Financial resource strain: Not on file  . Food insecurity:    Worry: Not on file    Inability: Not on file  . Transportation needs:    Medical: Not on file    Non-medical: Not on file  Tobacco Use  . Smoking status: Never Smoker  . Smokeless tobacco: Never Used  Substance and Sexual Activity  . Alcohol use: No    Alcohol/week: 0.0 standard drinks  . Drug use: No  . Sexual activity: Not on file  Lifestyle  . Physical activity:    Days per week: Not on file    Minutes per session: Not on file  . Stress: Not on file  Relationships  . Social connections:    Talks on phone: Not on file    Gets together: Not on file    Attends religious service: Not on file    Active member of club or organization: Not on file     Attends meetings of clubs or organizations: Not on file    Relationship status: Not on file  Other Topics Concern  . 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 Breast cancer Mother 42  . Arthritis Mother   . Hyperlipidemia Mother   . Heart disease Mother   . Hypertension Mother   . Kidney disease Mother   . Diabetes Mother   . Arthritis Father   . Esophageal cancer Father 67  . Colon cancer Maternal Grandmother 78  . Arthritis Maternal Grandmother   . Hypertension Maternal Grandmother   . Kidney disease Maternal Grandmother   . Breast cancer Maternal Grandmother   . Diabetes Maternal Grandmother   . Breast cancer Maternal Aunt        x 2 aunt  . Arthritis Other   . Kidney disease Other        [place] nephritis -pt states mostly maternal side of family as the disease  . Esophageal cancer Paternal Uncle   . Rectal cancer Neg Hx   . Stomach cancer Neg Hx   . Pancreatic cancer Neg Hx     Review of Systems  Constitutional: Negative for fever.  Musculoskeletal:       Swelling, pain left foot  Skin: Positive for color change (Dorsal aspect of foot).  Neurological: Negative for weakness and numbness.       Objective:   Vitals:   07/25/18 1350  BP: 112/68  Pulse: 88  Resp: 16  Temp: 98.4 F (36.9 C)  SpO2: 97%   BP Readings from Last 3 Encounters:  07/25/18 112/68  05/12/18 116/78  12/13/17 110/88   Wt Readings from Last 3 Encounters:  07/25/18 142 lb 12.8 oz (64.8 kg)  05/12/18 142 lb 1.3 oz (64.4 kg)  12/13/17 144 lb (65.3 kg)   Body mass index is 24.82 kg/m.   Physical Exam  Constitutional: She appears well-developed and well-nourished. No distress.  HENT:  Head: Normocephalic  and atraumatic.  Cardiovascular: Intact distal pulses.  Musculoskeletal: Normal range of motion. She exhibits tenderness (Base of left first MTP joint with palpation and with movement of the toe). She exhibits no edema or deformity.  Neurological: No sensory deficit.  Skin:  Skin is warm and dry. She is not diaphoretic.  Mild erythema dorsal aspect of medial left foot           Assessment & Plan:    See Problem List for Assessment and Plan of chronic medical problems.

## 2018-07-25 ENCOUNTER — Ambulatory Visit (INDEPENDENT_AMBULATORY_CARE_PROVIDER_SITE_OTHER): Payer: Medicare Other | Admitting: Internal Medicine

## 2018-07-25 ENCOUNTER — Ambulatory Visit (INDEPENDENT_AMBULATORY_CARE_PROVIDER_SITE_OTHER)
Admission: RE | Admit: 2018-07-25 | Discharge: 2018-07-25 | Disposition: A | Discharge status: Home / Self Care | Payer: Medicare Other | Source: Ambulatory Visit | Attending: Internal Medicine | Admitting: Internal Medicine

## 2018-07-25 ENCOUNTER — Encounter: Payer: Self-pay | Admitting: Internal Medicine

## 2018-07-25 VITALS — BP 112/68 | HR 88 | Temp 98.4°F | Resp 16 | Ht 63.6 in | Wt 142.8 lb

## 2018-07-25 DIAGNOSIS — M7989 Other specified soft tissue disorders: Secondary | ICD-10-CM | POA: Diagnosis not present

## 2018-07-25 DIAGNOSIS — M79672 Pain in left foot: Secondary | ICD-10-CM

## 2018-07-25 NOTE — Patient Instructions (Signed)
Have an x-ray today.  We will call you with the results.    Continue Tylenol and Advil.

## 2018-07-25 NOTE — Assessment & Plan Note (Signed)
Fall 1 week ago that resulted in pain, swelling and mild erythema Possible fracture-we will obtain an x-ray If there is a fracture will refer to podiatry Ortho Continue Tylenol/Advil Continue wearing a tennis shoe

## 2018-08-28 NOTE — Progress Notes (Signed)
Subjective:    Patient ID: Sarah Valentine, female    DOB: 06-12-1951, 67 y.o.   MRN: [id number]  HPI The patient is here for an acute visit.   ? UTI:  She was a little dehydrated a couple of weekends ago when she was sick with a virus - she thinks she probabaly did not drink enough - she is not sure if that is related.  She is experiencing fevers, mild abdominal pain, nausea, dysuria, malodorous urine, increased urinary frequency, lower back pain and headaches.  She denies any blood in the urine.   She has had 4 UTI's in the past year.  She does have nephritis.  This is a hereditary condition and the specific type of nephritis is not known.  She is no longer following with gynecology.  She denies any relation to sexual activity.  She does try to drink plenty of fluids no regular basis.   Medications and allergies reviewed with patient and updated if appropriate.  Patient Active Problem List   Diagnosis Date Noted  . Left foot pain 07/25/2018  . Urinary tract infection without hematuria 11/17/2017  . Fever and chills 05/11/2017  . Cellulitis of leg, right 04/23/2017  . Right ankle pain 04/23/2017  . Pain of right heel 04/23/2017  . Prediabetes 10/07/2016  . Lung nodule, solitary 10/07/2016  . Family history of breast cancer 11/25/2015  . 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 Incomplete RBBB   . GERD (gastroesophageal reflux disease)   . Hypertension   . Hyperlipidemia   . 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[place])   . Migraines   . BARRETTS ESOPHAGUS 09/05/2010  . PERSONAL HISTORY OF COLONIC POLYPS 09/05/2010    Current Outpatient Medications on File Prior to Visit  Medication Sig Dispense Refill  . Cranberry 1000 MG CAPS Take 500 mg by mouth daily.     Marland Kitchen olmesartan (BENICAR) 20 MG tablet Take one-half tablet daily. -- Office visit needed for further refills 45 tablet 0  . omeprazole (PRILOSEC) 20 MG capsule Take 1 capsule (20 mg total) by mouth daily. (Patient taking differently: Take 20  mg by mouth daily as needed. ) 90 capsule 3   No current facility-administered medications on file prior to visit.     Past Medical History:  Diagnosis Date  . [place] 2010  . BCC (basal cell carcinoma), trunk 08/2013   removed from back ([facility])  . GERD (gastroesophageal reflux disease)   . Hx of adenomatous colonic polyps 12/2008  . Hyperlipidemia   . Hypertension   . 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[place]) 2011   Melanoma in situ (L) buttock, dr Delman Cheadle  . Migraines   . Nephritis, hereditary    familial chronic nephritis  . 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 ABDOMINAL HYSTERECTOMY  1993  . BASAL CELL CARCINOMA EXCISION  08/2013   back  . CHOLECYSTECTOMY  2010  . COLONOSCOPY W/ BIOPSIES AND POLYPECTOMY  12/2008   polyp removed  . MELANOMA EXCISION Left 2011   buttock  . Perinephtric abscess with peritonitis  1980  . SQUAMOUS CELL CARCINOMA EXCISION  01/2014   back    Social History   Socioeconomic History  . Marital status: Married    Spouse name: Not on file  . Number of children: Not on file  . Years of education: Not on file  . Highest education level: Not on file  Occupational History  . Occupation: Optician, dispensing: [employer]  . Financial resource strain: Not on file  . Food insecurity:    Worry: Not on file    Inability: Not on file  . Transportation needs:    Medical: Not on file    Non-medical: Not on file  Tobacco Use  . Smoking status: Never Smoker  . Smokeless tobacco: Never Used  Substance and Sexual Activity  . Alcohol use: No    Alcohol/week: 0.0 standard drinks  . Drug use: No  . Sexual activity: Not on file  Lifestyle  . Physical activity:    Days per week: Not on file    Minutes per session: Not on file  . Stress: Not on file  Relationships  . Social connections:    Talks on phone: Not on file    Gets together:  Not on file    Attends religious service: Not on file    Active member of club or organization: Not on file    Attends meetings of clubs or organizations: Not on file    Relationship status: Not on file  Other Topics Concern  . 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 Breast cancer Mother 6  . Arthritis Mother   . Hyperlipidemia Mother   . Heart disease Mother   . Hypertension Mother   . Kidney disease Mother   . Diabetes Mother   . Arthritis Father   . Esophageal cancer Father 70  . Colon cancer Maternal Grandmother 78  . Arthritis Maternal Grandmother   . Hypertension Maternal Grandmother   . Kidney disease Maternal Grandmother   . Breast cancer Maternal Grandmother   . Diabetes Maternal Grandmother   . Breast cancer Maternal Aunt        x 2 aunt  . Arthritis Other   . Kidney disease Other        Lport nephritis -pt states mostly maternal side of family as the disease  . Esophageal cancer Paternal Uncle   . Rectal cancer Neg Hx   . Stomach cancer Neg Hx   . Pancreatic cancer Neg Hx     Review of Systems  Constitutional: Positive for fever. Negative for chills.  Gastrointestinal: Positive for abdominal pain (minimal) and nausea.  Genitourinary: Positive for dysuria and frequency. Negative for hematuria.       Odor  Musculoskeletal: Positive for back pain.  Neurological: Positive for headaches.       Objective:   Vitals:   08/29/18 1256  BP: 122/78  Pulse: 84  Resp: 16  Temp: 99.1 F (37.3 C)  SpO2: 97%   BP Readings from Last 3 Encounters:  08/29/18 122/78  07/25/18 112/68  05/12/18 116/78   Wt Readings from Last 3 Encounters:  08/29/18 144 lb 12.8 oz (65.7 kg)  07/25/18  142 lb 12.8 oz (64.8 kg)  05/12/18 142 lb 1.3 oz (64.4 kg)   Body mass index is 25.17 kg/m.   Physical Exam  Constitutional: She appears well-developed and well-nourished. No distress.  HENT:  Head: Normocephalic and atraumatic.  Abdominal: Soft. She exhibits no distension. There is  no tenderness.  Genitourinary:  Genitourinary Comments: No CVA tenderness bilaterally  Musculoskeletal: She exhibits no edema.  Skin: Skin is warm and dry. She is not diaphoretic.           Assessment & Plan:    See Problem List for Assessment and Plan of chronic medical problems.

## 2018-08-29 ENCOUNTER — Other Ambulatory Visit: Payer: Medicare Other

## 2018-08-29 ENCOUNTER — Ambulatory Visit (INDEPENDENT_AMBULATORY_CARE_PROVIDER_SITE_OTHER): Payer: Medicare Other | Admitting: Internal Medicine

## 2018-08-29 ENCOUNTER — Encounter: Payer: Self-pay | Admitting: Internal Medicine

## 2018-08-29 VITALS — BP 122/78 | HR 84 | Temp 99.1°F | Resp 16 | Ht 63.6 in | Wt 144.8 lb

## 2018-08-29 DIAGNOSIS — N3000 Acute cystitis without hematuria: Secondary | ICD-10-CM | POA: Diagnosis not present

## 2018-08-29 DIAGNOSIS — R300 Dysuria: Secondary | ICD-10-CM

## 2018-08-29 DIAGNOSIS — N390 Urinary tract infection, site not specified: Secondary | ICD-10-CM

## 2018-08-29 HISTORY — DX: Urinary tract infection, site not specified: N39.0

## 2018-08-29 LAB — POCT URINALYSIS DIPSTICK
Bilirubin, UA: NEGATIVE
Blood, UA: NEGATIVE
GLUCOSE UA: NEGATIVE
Ketones, UA: NEGATIVE
NITRITE UA: NEGATIVE
PROTEIN UA: POSITIVE — AB
Spec Grav, UA: 1.02 (ref 1.010–1.025)
Urobilinogen, UA: NEGATIVE E.U./dL — AB
pH, UA: 6 (ref 5.0–8.0)

## 2018-08-29 MED ORDER — CEPHALEXIN 500 MG PO CAPS: 500.0000 mg | ORAL_CAPSULE | Freq: Two times a day (BID) | ORAL | 0 refills | Status: DC

## 2018-08-29 NOTE — Assessment & Plan Note (Signed)
Urine dip consistent with UTI Will send urine for culture Take the antibiotic as prescribed.   Take tylenol if needed.   Increase your water intake.  Call if no improvement   

## 2018-08-29 NOTE — Patient Instructions (Signed)
Take the antibiotic as prescribed.  Take tylenol if needed.  Increase your water intake.    A referral was ordered for urology.

## 2018-08-29 NOTE — Assessment & Plan Note (Signed)
This is her fifth UTI in the past year Appears all be E. coli ?  Related to estrogen deficiency-?  Estrogen cream help with prevention Will refer to urology to rule out other causes-has had abdominal surgery in the past and has nephritis there is hereditary-specific cause not known Referral ordered

## 2018-08-31 ENCOUNTER — Encounter: Payer: Self-pay | Admitting: Internal Medicine

## 2018-08-31 LAB — URINE CULTURE
MICRO NUMBER: 91293555
SPECIMEN QUALITY: ADEQUATE

## 2018-09-07 ENCOUNTER — Encounter: Payer: Self-pay | Admitting: Podiatry

## 2018-09-07 ENCOUNTER — Ambulatory Visit (INDEPENDENT_AMBULATORY_CARE_PROVIDER_SITE_OTHER): Payer: Medicare Other

## 2018-09-07 ENCOUNTER — Ambulatory Visit (INDEPENDENT_AMBULATORY_CARE_PROVIDER_SITE_OTHER): Payer: Medicare Other | Admitting: Podiatry

## 2018-09-07 ENCOUNTER — Other Ambulatory Visit: Payer: Self-pay | Admitting: Podiatry

## 2018-09-07 VITALS — BP 113/76

## 2018-09-07 DIAGNOSIS — M779 Enthesopathy, unspecified: Secondary | ICD-10-CM | POA: Diagnosis not present

## 2018-09-07 DIAGNOSIS — M79672 Pain in left foot: Secondary | ICD-10-CM

## 2018-09-07 MED ORDER — TRIAMCINOLONE ACETONIDE 10 MG/ML IJ SUSP
10.0000 mg | Freq: Once | INTRAMUSCULAR | Status: AC
  Administered 2018-09-07: 10 mg

## 2018-09-07 NOTE — Progress Notes (Signed)
Subjective:   Patient ID: Sarah Valentine, female   DOB: 67 y.o.   MRN: [id number]   HPI Patient presents stating she fell several months ago and she has had discomfort in her big toe joint and states that it simply is not getting better and she is not sure if she broke something but it is painful with ambulation.  Patient does not smoke likes to be active   Review of Systems  All other systems reviewed and are negative.       Objective:  Physical Exam  Constitutional: She appears well-developed and well-nourished.  Cardiovascular: Intact distal pulses.  Pulmonary/Chest: Effort normal.  Musculoskeletal: Normal range of motion.  Neurological: She is alert.  Skin: Skin is warm.  Nursing note and vitals reviewed.   Neurovascular status intact muscle strength is adequate range of motion within normal limits with patient found to have inflammation pain around the big toe joint left with no range of motion loss or crepitus.  Patient has mild swelling around the joint noted and has good digital perfusion well oriented x3     Assessment:  Inflammatory capsulitis first MPJ left secondary to injury with possibility for bone injury     Plan:  H&P x-rays reviewed and today I did a careful injection around the first MPJ periarticular after sterile prep with 3 mg Kenalog 5 Milgram Xylocaine advised on reduced activity rigid bottom shoes and reappoint if symptoms persist  X-rays were negative for signs of fracture or bony injury

## 2018-09-09 ENCOUNTER — Encounter: Payer: Self-pay | Admitting: Internal Medicine

## 2018-09-09 MED ORDER — NITROFURANTOIN MONOHYD MACRO 100 MG PO CAPS: 100.0000 mg | ORAL_CAPSULE | Freq: Two times a day (BID) | ORAL | 0 refills | Status: DC

## 2018-09-21 DIAGNOSIS — N8112 Cystocele, lateral: Secondary | ICD-10-CM | POA: Diagnosis not present

## 2018-09-21 DIAGNOSIS — N3020 Other chronic cystitis without hematuria: Secondary | ICD-10-CM | POA: Diagnosis not present

## 2018-09-21 DIAGNOSIS — N952 Postmenopausal atrophic vaginitis: Secondary | ICD-10-CM | POA: Diagnosis not present

## 2018-10-13 DIAGNOSIS — Z87898 Personal history of other specified conditions: Secondary | ICD-10-CM | POA: Diagnosis not present

## 2018-10-13 DIAGNOSIS — L821 Other seborrheic keratosis: Secondary | ICD-10-CM | POA: Diagnosis not present

## 2018-10-13 DIAGNOSIS — Z86018 Personal history of other benign neoplasm: Secondary | ICD-10-CM | POA: Diagnosis not present

## 2018-10-13 DIAGNOSIS — Z808 Family history of malignant neoplasm of other organs or systems: Secondary | ICD-10-CM | POA: Diagnosis not present

## 2018-10-13 DIAGNOSIS — D225 Melanocytic nevi of trunk: Secondary | ICD-10-CM | POA: Diagnosis not present

## 2018-10-13 DIAGNOSIS — D2262 Melanocytic nevi of left upper limb, including shoulder: Secondary | ICD-10-CM | POA: Diagnosis not present

## 2018-10-13 DIAGNOSIS — D2272 Melanocytic nevi of left lower limb, including hip: Secondary | ICD-10-CM | POA: Diagnosis not present

## 2018-10-13 DIAGNOSIS — D2271 Melanocytic nevi of right lower limb, including hip: Secondary | ICD-10-CM | POA: Diagnosis not present

## 2018-10-13 DIAGNOSIS — Z23 Encounter for immunization: Secondary | ICD-10-CM | POA: Diagnosis not present

## 2018-10-16 ENCOUNTER — Other Ambulatory Visit: Payer: Self-pay | Admitting: Internal Medicine

## 2018-10-20 DIAGNOSIS — N3020 Other chronic cystitis without hematuria: Secondary | ICD-10-CM | POA: Diagnosis not present

## 2018-10-20 DIAGNOSIS — N952 Postmenopausal atrophic vaginitis: Secondary | ICD-10-CM | POA: Diagnosis not present

## 2018-11-02 HISTORY — PX: BACK SURGERY: SHX140

## 2018-12-13 DIAGNOSIS — N952 Postmenopausal atrophic vaginitis: Secondary | ICD-10-CM | POA: Diagnosis not present

## 2018-12-13 DIAGNOSIS — N3020 Other chronic cystitis without hematuria: Secondary | ICD-10-CM | POA: Diagnosis not present

## 2019-01-15 ENCOUNTER — Other Ambulatory Visit: Payer: Self-pay | Admitting: Internal Medicine

## 2019-01-16 DIAGNOSIS — N3020 Other chronic cystitis without hematuria: Secondary | ICD-10-CM | POA: Diagnosis not present

## 2019-01-16 DIAGNOSIS — N952 Postmenopausal atrophic vaginitis: Secondary | ICD-10-CM | POA: Diagnosis not present

## 2019-02-02 ENCOUNTER — Telehealth: Payer: Medicare Other | Admitting: Family

## 2019-02-02 DIAGNOSIS — M5440 Lumbago with sciatica, unspecified side: Secondary | ICD-10-CM | POA: Diagnosis not present

## 2019-02-02 MED ORDER — PREDNISONE 10 MG (21) PO TBPK: ORAL_TABLET | ORAL | 0 refills | Status: DC

## 2019-02-02 NOTE — Progress Notes (Signed)
We are sorry that you are not feeling well.  Here is how we plan to help!  Based on what you have shared with me it looks like you mostly have acute back pain.  Acute back pain is defined as musculoskeletal pain that can resolve in 1-3 weeks with conservative treatment.  I have prescribed a medrol dosepak.  Back pain is very common.  The pain often gets better over time.  The cause of back pain is usually not dangerous.  Most people can learn to manage their back pain on their own.  Home Care  Stay active.  Start with short walks on flat ground if you can.  Try to walk farther each day.  Do not sit, drive or stand in one place for more than 30 minutes.  Do not stay in bed.  Do not avoid exercise or work.  Activity can help your back heal faster.  Be careful when you bend or lift an object.  Bend at your knees, keep the object close to you, and do not twist.  Sleep on a firm mattress.  Lie on your side, and bend your knees.  If you lie on your back, put a pillow under your knees.  Only take medicines as told by your doctor.  Put ice on the injured area.  Put ice in a plastic bag  Place a towel between your skin and the bag  Leave the ice on for 15-20 minutes, 3-4 times a day for the first 2-3 days. 210 After that, you can switch between ice and heat packs.  Ask your doctor about back exercises or massage.  Avoid feeling anxious or stressed.  Find good ways to deal with stress, such as exercise.  Get Help Right Way If:  Your pain does not go away with rest or medicine.  Your pain does not go away in 1 week.  You have new problems.  You do not feel well.  The pain spreads into your legs.  You cannot control when you poop (bowel movement) or pee (urinate)  You feel sick to your stomach (nauseous) or throw up (vomit)  You have belly (abdominal) pain.  You feel like you may pass out (faint).  If you develop a fever.  Make Sure you:  Understand these  instructions.  Will watch your condition  Will get help right away if you are not doing well or get worse.  Your e-visit answers were reviewed by a board certified advanced clinical practitioner to complete your personal care plan.  Depending on the condition, your plan could have included both over the counter or prescription medications.  If there is a problem please reply  once you have received a response from your provider.  Your safety is important to [place].  If you have drug allergies check your prescription carefully.    You can use MyChart to ask questions about today's visit, request a non-urgent call back, or ask for a work or school excuse for 24 hours related to this e-Visit. If it has been greater than 24 hours you will need to follow up with your provider, or enter a new e-Visit to address those concerns.  You will get an e-mail in the next two days asking about your experience.  I hope that your e-visit has been valuable and will speed your recovery. Thank you for using e-visits.

## 2019-02-22 ENCOUNTER — Encounter: Payer: Medicare Other | Admitting: Internal Medicine

## 2019-03-30 NOTE — Patient Instructions (Addendum)
  Sarah Valentine , Thank you for taking time to come for your Medicare Wellness Visit. I appreciate your ongoing commitment to your health goals. Please review the following plan we discussed and let me know if I can assist you in the future.   These are the goals we discussed: Goals   None     This is a list of the screening recommended for you and due dates:  Health Maintenance  Topic Date Due  . Pneumonia vaccines (2 of 2 - PPSV23) Given today  . Flu Shot  06/03/2019  . Mammogram  03/03/2020  . Colon Cancer Screening  12/13/2020  . Tetanus Vaccine  12/04/2025  . DEXA scan (bone density measurement)   12/06/2013-ordered  .  Hepatitis C: One time screening is recommended by Center for Disease Control  (CDC) for  adults born from [birth year] through 1964.   Completed     Tests ordered today. Your results will be released to [facility] (or called to you) after review, usually within 72hours after test completion. If any changes need to be made, you will be notified at that same time.  All other Health Maintenance issues reviewed.   All recommended immunizations and age-appropriate screenings are up-to-date or discussed.  Pneumonia immunization administered today.   Medications reviewed and updated.  Changes include :   none  Your prescription(s) have been submitted to your pharmacy. Please take as directed and contact our office if you believe you are having problem(s) with the medication(s).   Please followup in one year

## 2019-03-30 NOTE — Progress Notes (Signed)
Subjective:    Patient ID: Sarah Valentine, female    DOB: 1950-11-17, 68 y.o.   MRN: [id number]  HPI Here for a initial Medicare wellness exam and follow-up of her chronic medical problems.   I have personally reviewed and have noted 1.The patient's medical and social history 2.Their use of alcohol, tobacco or illicit drugs 3.Their current medications and supplements 4.The patient's functional ability including ADL's, fall risks, home                 safety risk and hearing or visual impairment. 5.Diet and physical activities 6.Evidence for depression or mood disorders 7.Care team reviewed  - urology - Dr Irine Seal,  Derm - Dr Delman Cheadle   Overall she feels well.  She has no specific concerns.  Hypertension: She is taking her medication daily. She is compliant with a low sodium diet.  She denies chest pain, palpitations, edema, shortness of breath and regular headaches.   Hyperlipidemia: She is not on any medication and is controlling her cholesterol with diet and exercise.  Nephritis, hereditary: She has a family history of nephritis and it is thought that it may be Alport syndrome, but it has never been confirmed because no one has had a kidney biopsy.  She denies any changes in her urination.  GERD:  She is taking her medication daily as prescribed.  She denies any GERD symptoms and feels her GERD is well controlled.   Prediabetes:  She is compliant with a low sugar/carbohydrate diet.  She is exercising regularly.    Are there smokers in your home (other than you)? No  Risk Factors Exercise: walking 40 minutes daily Dietary issues discussed:  Well balanced, eats lots of veges  Vitamin and supplement use:  Cranberry pills  Opiod use:   N/A   Cardiac risk factors: advanced age, hypertension, hyperlipidemia-diet controlled, prediabetes  Depression Screen  Have you felt down, depressed or hopeless? No  Have  you felt little interest or pleasure in doing things?  No  Depression screen [facility] 2/9 03/31/2019 09/28/2017 10/20/2013  Decreased Interest 0 0 0  Down, Depressed, Hopeless 0 0 0  PHQ - 2 Score 0 0 0    Activities of Daily Living In your present state of health, do you have any difficulty performing the following activities?:  Driving? No Managing money?  No Feeding yourself? No Getting from bed to chair? No Climbing a flight of stairs? No Preparing food and eating?: No Bathing or showering? No Getting dressed: No Getting to/using the toilet? No Moving around from place to place: No In the past year have you fallen or had a near fall?: No   Do you have more than one partner?  No   Hearing Difficulties:  Do you often ask people to speak up or repeat themselves? Yes, right ear since she was younger Do you experience ringing or noises in your ears? No Do you have difficulty understanding soft or whispered voices? yes Vision:              Any change in vision:  no             Up to date with eye exam:   No - will schedule  Memory:  Do you feel that you have a problem with memory? No  Do you often misplace items? No  Do you feel safe at home?  Yes  Cognitive Testing  Alert, Orientated? Yes  Normal Appearance? Yes  Recall of three  objects?  Yes  Can perform simple calculations? Yes  Displays appropriate judgment? Yes  Can read the correct time from a watch face? Yes   Advanced Directives have been discussed with the patient? Yes   MMSE - Mini Mental State Exam 03/31/2019  Orientation to time 5  Orientation to Place 5  Registration 3  Attention/ Calculation 5  Recall 3  Language- name 2 objects 2  Language- repeat 1  Language- follow 3 step command 3  Language- read & follow direction 1  Write a sentence 1  Copy design 1  Total score 30     Medications and allergies reviewed with patient and updated if appropriate.  Patient Active Problem List   Diagnosis Date  Noted  . Frequent UTI 08/29/2018  . Left foot pain 07/25/2018  . Right ankle pain 04/23/2017  . Pain of right heel 04/23/2017  . Prediabetes 10/07/2016  . Lung nodule, solitary 10/07/2016  . Family history of breast cancer 11/25/2015  . 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 Incomplete RBBB   . GERD (gastroesophageal reflux disease)   . Hypertension   . Hyperlipidemia   . 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[place])   . Migraines   . BARRETTS ESOPHAGUS 09/05/2010  . PERSONAL HISTORY OF COLONIC POLYPS 09/05/2010    Current medications   Cranberry 1000 MG Caps Take 500 mg by mouth daily.   estradiol 0.1 MG/GM vaginal cream Commonly known as:  ESTRACE Place 1 Applicatorful vaginally at bedtime.   olmesartan 20 MG tablet Commonly known as:  BENICAR Take 1/2 tablet daily. What changed:  See the new instructions. Changed by:  Binnie Rail, MD   omeprazole 20 MG capsule Commonly known as:  PRILOSEC Take 1 capsule (20 mg total) by mouth daily as needed.        Past Medical History:  Diagnosis Date  . [place] 2010  . BCC (basal cell carcinoma), trunk 08/2013   removed from back ([facility])  . GERD (gastroesophageal reflux disease)   . Hx of adenomatous colonic polyps 12/2008  . Hyperlipidemia   . Hypertension   . 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[place]) 2011   Melanoma in situ (L) buttock, dr Delman Cheadle  . Migraines   . Nephritis, hereditary    familial chronic nephritis  . 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 ABDOMINAL HYSTERECTOMY  1993  . BASAL CELL CARCINOMA EXCISION  08/2013   back  . CHOLECYSTECTOMY  2010  . COLONOSCOPY W/ BIOPSIES AND POLYPECTOMY  12/2008   polyp removed  . MELANOMA EXCISION Left 2011   buttock  . Perinephtric abscess with peritonitis  1980  . SQUAMOUS CELL CARCINOMA EXCISION  01/2014   back    Social History   Socioeconomic History  . Marital status: Married    Spouse  name: Not on file  . Number of children: Not on file  . Years of education: Not on file  . Highest education level: Not on file  Occupational History  . Occupation: Optician, dispensing: [employer]  . Financial resource strain: Not on file  . Food insecurity:    Worry: Not on file    Inability: Not on file  . Transportation needs:    Medical: Not on file    Non-medical: Not on file  Tobacco Use  . Smoking status: Never Smoker  . Smokeless tobacco:  Never Used  Substance and Sexual Activity  . Alcohol use: No    Alcohol/week: 0.0 standard drinks  . Drug use: No  . Sexual activity: Not on file  Lifestyle  . Physical activity:    Days per week: Not on file    Minutes per session: Not on file  . Stress: Not on file  Relationships  . Social connections:    Talks on phone: Not on file    Gets together: Not on file    Attends religious service: Not on file    Active member of club or organization: Not on file    Attends meetings of clubs or organizations: Not on file    Relationship status: Not on file  Other Topics Concern  . 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 Breast cancer Mother 48  . Arthritis Mother   . Hyperlipidemia Mother   . Heart disease Mother   . Hypertension Mother   . Kidney disease Mother   . Diabetes Mother   . Arthritis Father   . Esophageal cancer Father 70  . Colon cancer Maternal Grandmother 78  . Arthritis Maternal Grandmother   . Hypertension Maternal Grandmother   . Kidney disease Maternal Grandmother   . Breast cancer Maternal Grandmother   . Diabetes Maternal Grandmother   . Breast cancer Maternal Aunt        x 2 aunt  . Arthritis  Other   . Kidney disease Other        Lport nephritis -pt states mostly maternal side of family as the disease  . Esophageal cancer Paternal Uncle   . Rectal cancer Neg Hx   . Stomach cancer Neg Hx   . Pancreatic cancer Neg Hx     Review of Systems  Constitutional: Negative for chills and fever.  HENT: Positive for hearing loss (right ear only).   Eyes: Negative for visual disturbance.  Respiratory: Negative for cough, shortness of breath and wheezing.   Cardiovascular: Negative for chest pain, palpitations and leg swelling.  Gastrointestinal: Negative for abdominal pain, blood in stool, constipation, diarrhea and nausea.       GERD controlled  Genitourinary: Negative for dysuria, frequency and hematuria.  Musculoskeletal: Positive for back pain. Negative for arthralgias.  Skin: Negative for color change and rash.  Neurological: Negative for dizziness, light-headedness and headaches.  Psychiatric/Behavioral: Negative for dysphoric mood. The patient is not nervous/anxious.        Objective:   Vitals:   03/31/19 0826  BP: 122/80  Pulse: 80  Resp: 16  Temp: 98.4 F (36.9 C)  SpO2: 97%   Filed Weights   03/31/19 0826  Weight: 144 lb 12.8 oz (65.7 kg)   Body mass index is 25.17 kg/m.  BP Readings from Last 3 Encounters:  03/31/19 122/80  09/07/18 113/76  08/29/18 122/78    Wt Readings from Last 3 Encounters:  03/31/19 144 lb 12.8 oz (65.7 kg)  08/29/18 144 lb 12.8 oz (65.7 kg)  07/25/18 142 lb 12.8 oz (64.8 kg)     Physical Exam Constitutional: She appears well-developed and well-nourished. No distress.  HENT:  Head: Normocephalic and atraumatic.  Right  Ear: External ear normal. Normal ear canal and TM Left Ear: External ear normal.  Normal ear canal and TM Mouth/Throat: Oropharynx is clear and moist.  Eyes: Conjunctivae and EOM are normal.  Neck: Neck supple. No tracheal deviation present. No thyromegaly present.  No carotid bruit  Cardiovascular: Normal  rate, regular rhythm and normal heart sounds.   No murmur heard.  No edema. Pulmonary/Chest: Effort normal and breath sounds normal. No respiratory distress. She has no wheezes. She has no rales.   Abdominal: Soft. She exhibits no distension. There is no tenderness.  Lymphadenopathy: She has no cervical adenopathy.  Skin: Skin is warm and dry. She is not diaphoretic.  Psychiatric: She has a normal mood and affect. Her behavior is normal.        Assessment & Plan:    Health Maintenance  Topic Date Due  . PNA vac Low Risk Adult (2 of 2 - PPSV23)  given today  . INFLUENZA VACCINE  06/03/2019  . MAMMOGRAM  03/03/2020  . COLONOSCOPY  12/13/2020  . TETANUS/TDAP  12/04/2025  . DEXA SCAN   12/06/2013-ordered  . 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   See Problem List for Assessment and Plan of chronic medical problems.   Follow-up in 1 year, ideally should repeat blood work in 6 months

## 2019-03-31 ENCOUNTER — Other Ambulatory Visit: Payer: Self-pay | Admitting: Internal Medicine

## 2019-03-31 ENCOUNTER — Ambulatory Visit (INDEPENDENT_AMBULATORY_CARE_PROVIDER_SITE_OTHER): Payer: Medicare Other | Admitting: Internal Medicine

## 2019-03-31 ENCOUNTER — Encounter: Payer: Self-pay | Admitting: Internal Medicine

## 2019-03-31 ENCOUNTER — Other Ambulatory Visit (INDEPENDENT_AMBULATORY_CARE_PROVIDER_SITE_OTHER): Payer: Medicare Other

## 2019-03-31 ENCOUNTER — Other Ambulatory Visit: Payer: Self-pay

## 2019-03-31 VITALS — BP 122/80 | HR 80 | Temp 98.4°F | Resp 16 | Ht 63.6 in | Wt 144.8 lb

## 2019-03-31 DIAGNOSIS — E7849 Other hyperlipidemia: Secondary | ICD-10-CM

## 2019-03-31 DIAGNOSIS — Z0000 Encounter for general adult medical examination without abnormal findings: Secondary | ICD-10-CM

## 2019-03-31 DIAGNOSIS — R7303 Prediabetes: Secondary | ICD-10-CM

## 2019-03-31 DIAGNOSIS — N390 Urinary tract infection, site not specified: Secondary | ICD-10-CM | POA: Diagnosis not present

## 2019-03-31 DIAGNOSIS — E2839 Other primary ovarian failure: Secondary | ICD-10-CM

## 2019-03-31 DIAGNOSIS — N078 Hereditary nephropathy, not elsewhere classified with other morphologic lesions: Secondary | ICD-10-CM

## 2019-03-31 DIAGNOSIS — I10 Essential (primary) hypertension: Secondary | ICD-10-CM

## 2019-03-31 DIAGNOSIS — Z13820 Encounter for screening for osteoporosis: Secondary | ICD-10-CM | POA: Diagnosis not present

## 2019-03-31 DIAGNOSIS — Z23 Encounter for immunization: Secondary | ICD-10-CM

## 2019-03-31 DIAGNOSIS — K2100 Gastro-esophageal reflux disease with esophagitis, without bleeding: Secondary | ICD-10-CM

## 2019-03-31 DIAGNOSIS — Z1231 Encounter for screening mammogram for malignant neoplasm of breast: Secondary | ICD-10-CM

## 2019-03-31 LAB — COMPREHENSIVE METABOLIC PANEL
ALT: 14 U/L (ref 0–35)
AST: 18 U/L (ref 0–37)
Albumin: 3.9 g/dL (ref 3.5–5.2)
Alkaline Phosphatase: 116 U/L (ref 39–117)
BUN: 35 mg/dL — ABNORMAL HIGH (ref 6–23)
CO2: 24 mEq/L (ref 19–32)
Calcium: 9.2 mg/dL (ref 8.4–10.5)
Chloride: 107 mEq/L (ref 96–112)
Creatinine, Ser: 1.86 mg/dL — ABNORMAL HIGH (ref 0.40–1.20)
GFR: 27.05 mL/min — ABNORMAL LOW (ref >60.00)
Glucose, Bld: 97 mg/dL (ref 70–99)
Potassium: 4.6 mEq/L (ref 3.5–5.1)
Sodium: 141 mEq/L (ref 135–145)
Total Bilirubin: 0.3 mg/dL (ref 0.2–1.2)
Total Protein: 6.7 g/dL (ref 6.0–8.3)

## 2019-03-31 LAB — CBC WITH DIFFERENTIAL/PLATELET
Basophils Absolute: 0 10*3/uL (ref 0.0–0.1)
Basophils Relative: 0.5 % (ref 0.0–3.0)
Eosinophils Absolute: 0.1 10*3/uL (ref 0.0–0.7)
Eosinophils Relative: 0.8 % (ref 0.0–5.0)
HCT: 37.1 % (ref 36.0–46.0)
Hemoglobin: 12.6 g/dL (ref 12.0–15.0)
Lymphocytes Relative: 23.2 % (ref 12.0–46.0)
Lymphs Abs: 1.8 10*3/uL (ref 0.7–4.0)
MCHC: 34 g/dL (ref 30.0–36.0)
MCV: 85.6 fl (ref 78.0–100.0)
Monocytes Absolute: 0.6 10*3/uL (ref 0.1–1.0)
Monocytes Relative: 7.8 % (ref 3.0–12.0)
Neutro Abs: 5.4 10*3/uL (ref 1.4–7.7)
Neutrophils Relative %: 67.7 % (ref 43.0–77.0)
Platelets: 262 10*3/uL (ref 150.0–400.0)
RBC: 4.33 Mil/uL (ref 3.87–5.11)
RDW: 13.8 % (ref 11.5–15.5)
WBC: 7.9 10*3/uL (ref 4.0–10.5)

## 2019-03-31 LAB — MICROALBUMIN / CREATININE URINE RATIO
Creatinine,U: 45.4 mg/dL
Microalb Creat Ratio: 134.5 mg/g — ABNORMAL HIGH (ref 0.0–30.0)
Microalb, Ur: 61 mg/dL — ABNORMAL HIGH (ref 0.0–1.9)

## 2019-03-31 LAB — LIPID PANEL
Cholesterol: 223 mg/dL — ABNORMAL HIGH (ref 0–200)
HDL: 52.1 mg/dL (ref >39.00)
LDL Cholesterol: 145 mg/dL — ABNORMAL HIGH (ref 0–99)
NonHDL: 170.99
Total CHOL/HDL Ratio: 4
Triglycerides: 129 mg/dL (ref 0.0–149.0)
VLDL: 25.8 mg/dL (ref 0.0–40.0)

## 2019-03-31 LAB — TSH: TSH: 0.88 u[IU]/mL (ref 0.35–4.50)

## 2019-03-31 LAB — HEMOGLOBIN A1C: Hgb A1c MFr Bld: 6 % (ref 4.6–6.5)

## 2019-03-31 MED ORDER — OLMESARTAN MEDOXOMIL 20 MG PO TABS: ORAL_TABLET | ORAL | 3 refills | Status: DC

## 2019-03-31 MED ORDER — OMEPRAZOLE 20 MG PO CPDR: 20.0000 mg | DELAYED_RELEASE_CAPSULE | Freq: Every day | ORAL | 3 refills | Status: DC | PRN

## 2019-03-31 MED ORDER — ESTRADIOL 0.1 MG/GM VA CREA: 1.0000 | TOPICAL_CREAM | Freq: Every day | VAGINAL | 8 refills | Status: DC

## 2019-03-31 NOTE — Assessment & Plan Note (Signed)
BP Readings from Last 3 Encounters:  03/31/19 122/80  09/07/18 113/76  08/29/18 122/78    BP well controlled Current regimen effective and well tolerated Continue current medications at current doses cmp

## 2019-03-31 NOTE — Assessment & Plan Note (Signed)
GERD controlled Continue daily medication  

## 2019-03-31 NOTE — Assessment & Plan Note (Signed)
Check a1c Low sugar / carb diet Stressed regular exercise   

## 2019-03-31 NOTE — Assessment & Plan Note (Signed)
Diet controlled Continue healthy diet and regular exercise Check lipid panel, TSH, CMP

## 2019-03-31 NOTE — Assessment & Plan Note (Signed)
Following with urology Using estrogen cream every other night, which does help reduce UTIs-continue

## 2019-03-31 NOTE — Assessment & Plan Note (Signed)
CMP, urine microalbumin Consider nephrology referral

## 2019-04-02 ENCOUNTER — Encounter: Payer: Self-pay | Admitting: Internal Medicine

## 2019-04-03 ENCOUNTER — Other Ambulatory Visit: Payer: Self-pay | Admitting: Internal Medicine

## 2019-04-03 DIAGNOSIS — N078 Hereditary nephropathy, not elsewhere classified with other morphologic lesions: Secondary | ICD-10-CM

## 2019-04-04 DIAGNOSIS — N3020 Other chronic cystitis without hematuria: Secondary | ICD-10-CM | POA: Diagnosis not present

## 2019-04-13 DIAGNOSIS — N078 Hereditary nephropathy, not elsewhere classified with other morphologic lesions: Secondary | ICD-10-CM | POA: Diagnosis not present

## 2019-04-13 DIAGNOSIS — N184 Chronic kidney disease, stage 4 (severe): Secondary | ICD-10-CM | POA: Diagnosis not present

## 2019-04-13 DIAGNOSIS — R809 Proteinuria, unspecified: Secondary | ICD-10-CM | POA: Diagnosis not present

## 2019-04-13 DIAGNOSIS — I129 Hypertensive chronic kidney disease with stage 1 through stage 4 chronic kidney disease, or unspecified chronic kidney disease: Secondary | ICD-10-CM | POA: Diagnosis not present

## 2019-04-19 ENCOUNTER — Other Ambulatory Visit: Payer: Self-pay | Admitting: Nephrology

## 2019-04-19 DIAGNOSIS — N184 Chronic kidney disease, stage 4 (severe): Secondary | ICD-10-CM

## 2019-05-01 ENCOUNTER — Ambulatory Visit
Admission: RE | Admit: 2019-05-01 | Discharge: 2019-05-01 | Disposition: A | Discharge status: Home / Self Care | Payer: Medicare Other | Source: Ambulatory Visit | Attending: Nephrology | Admitting: Nephrology

## 2019-05-01 DIAGNOSIS — N189 Chronic kidney disease, unspecified: Secondary | ICD-10-CM | POA: Diagnosis not present

## 2019-05-01 DIAGNOSIS — N184 Chronic kidney disease, stage 4 (severe): Secondary | ICD-10-CM

## 2019-05-03 DIAGNOSIS — N184 Chronic kidney disease, stage 4 (severe): Secondary | ICD-10-CM | POA: Diagnosis not present

## 2019-05-08 NOTE — Progress Notes (Signed)
Subjective:    Patient ID: Sarah Valentine, female    DOB: Jun 09, 1951, 68 y.o.   MRN: [id number]  HPI The patient is here for an acute visit.   Back pain radiating down right leg:   It started 3-4 months ago.  She did a medrol dose pak in April and that helped.  She has been using a TENs unit and that has helped.  She recently went to the beach and was playing in the waves and it got worse.    She has pain in her right lower back and down her right leg to her ankle.  She has a lot of pain on the lateral aspect of her lower right leg.    She had a previous pinched nerve x 2 in the past.  One time it improved with rest and PT.  The second time was related to her ovary being adhered to the spine and resolved after her hysterectomy.   She denies any numbness, tingling.  At one point she was experiencing weakness in the leg and a heaviness in the leg, but those symptoms improved after the Medrol Dosepak.  Her pain is worse with sitting.     Medications and allergies reviewed with patient and updated if appropriate.  Patient Active Problem List   Diagnosis Date Noted  . Frequent UTI 08/29/2018  . Left foot pain 07/25/2018  . Right ankle pain 04/23/2017  . Pain of right heel 04/23/2017  . Prediabetes 10/07/2016  . Lung nodule, solitary 10/07/2016  . Family history of breast cancer 11/25/2015  . 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 Incomplete RBBB   . GERD (gastroesophageal reflux disease)   . Hypertension   . Hyperlipidemia   . 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[place])   . Migraines   . BARRETTS ESOPHAGUS 09/05/2010  . PERSONAL HISTORY OF COLONIC POLYPS 09/05/2010    Current Outpatient Medications on File Prior to Visit  Medication Sig Dispense Refill  . Cranberry 1000 MG CAPS Take 500 mg by mouth daily.     Marland Kitchen estradiol (ESTRACE) 0.1 MG/GM vaginal cream Place 1 Applicatorful vaginally at bedtime. 42.5 g 8  . olmesartan (BENICAR) 20 MG tablet Take 1/2 tablet daily. (Patient taking  differently: 20 mg. Take 1 tablet daily) 45 tablet 3  . omeprazole (PRILOSEC) 20 MG capsule Take 1 capsule (20 mg total) by mouth daily as needed. 90 capsule 3   No current facility-administered medications on file prior to visit.     Past Medical History:  Diagnosis Date  . [place] 2010  . BCC (basal cell carcinoma), trunk 08/2013   removed from back ([facility])  . GERD (gastroesophageal reflux disease)   . Hx of adenomatous colonic polyps 12/2008  . Hyperlipidemia   . Hypertension   . 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[place]) 2011   Melanoma in situ (L) buttock, dr Delman Cheadle  . Migraines   . Nephritis, hereditary    familial chronic nephritis  . 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 ABDOMINAL HYSTERECTOMY  1993  . BASAL CELL CARCINOMA EXCISION  08/2013   back  . CHOLECYSTECTOMY  2010  . COLONOSCOPY W/ BIOPSIES AND POLYPECTOMY  12/2008   polyp removed  . MELANOMA EXCISION Left 2011   buttock  . Perinephtric abscess with peritonitis  1980  . SQUAMOUS CELL CARCINOMA EXCISION  01/2014   back    Social History  Socioeconomic History  . Marital status: Married    Spouse name: Not on file  . Number of children: Not on file  . Years of education: Not on file  . Highest education level: Not on file  Occupational History  . Occupation: Optician, dispensing: [employer]  . Financial resource strain: Not on file  . Food insecurity    Worry: Not on file    Inability: Not on file  . Transportation needs    Medical: Not on file    Non-medical: Not on file  Tobacco Use  . Smoking status: Never Smoker  . Smokeless tobacco: Never Used  Substance and Sexual Activity  . Alcohol use: No    Alcohol/week: 0.0 standard drinks  . Drug use: No  . Sexual activity: Not on file  Lifestyle  . Physical activity    Days per week: Not on file    Minutes per session: Not on file   . Stress: Not on file  Relationships  . Social Herbalist on phone: Not on file    Gets together: Not on file    Attends religious service: Not on file    Active member of club or organization: Not on file    Attends meetings of clubs or organizations: Not on file    Relationship status: Not on file  Other Topics Concern  . 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 from [place] in [place]    Family History  Problem Relation Age of Onset  . Breast cancer Mother 52  . Arthritis Mother   . Hyperlipidemia Mother   . Heart disease Mother   . Hypertension Mother   . Kidney disease Mother   . Diabetes Mother   . Arthritis Father   . Esophageal cancer Father 71  . Colon cancer Maternal Grandmother 78  . Arthritis Maternal Grandmother   . Hypertension Maternal Grandmother   . Kidney disease Maternal Grandmother   . Breast cancer Maternal Grandmother   . Diabetes Maternal Grandmother   . Breast cancer Maternal Aunt        x 2 aunt  . Arthritis Other   . Kidney disease Other        [place] nephritis -pt states mostly maternal side of family as the disease  . Esophageal cancer Paternal Uncle   . Rectal cancer Neg Hx   . Stomach cancer Neg Hx   . Pancreatic cancer Neg Hx     Review of Systems  Constitutional: Negative for chills and fever.  Gastrointestinal:       No fecal incontinence  Genitourinary:       No incontinence       Objective:   Vitals:   05/09/19 1056  BP: 118/78  Pulse: 67  Resp: 16  Temp: 98.4 F (36.9 C)  SpO2: 99%   BP Readings from Last 3 Encounters:  05/09/19 118/78  03/31/19 122/80  09/07/18 113/76   Wt Readings from Last 3 Encounters:  05/09/19 143 lb 12.8 oz (65.2 kg)  03/31/19 144 lb 12.8 oz (65.7 kg)  08/29/18 144 lb 12.8  oz (65.7 kg)   Body mass index is 24.99 kg/m.   Physical Exam Constitutional:  General: She is not in acute distress.    Appearance: Normal appearance. She is not ill-appearing.  HENT:     Head: Normocephalic and atraumatic.  Musculoskeletal:        General: No swelling or deformity.     Comments: Mild tenderness right lower back  Skin:    General: Skin is warm and dry.  Neurological:     General: No focal deficit present.     Mental Status: She is alert.     Sensory: No sensory deficit.     Motor: No weakness.     Gait: Gait normal.            Assessment & Plan:    See Problem List for Assessment and Plan of chronic medical problems.

## 2019-05-09 ENCOUNTER — Other Ambulatory Visit: Payer: Self-pay

## 2019-05-09 ENCOUNTER — Encounter: Payer: Self-pay | Admitting: Internal Medicine

## 2019-05-09 ENCOUNTER — Ambulatory Visit (INDEPENDENT_AMBULATORY_CARE_PROVIDER_SITE_OTHER): Payer: Medicare Other | Admitting: Internal Medicine

## 2019-05-09 ENCOUNTER — Ambulatory Visit (INDEPENDENT_AMBULATORY_CARE_PROVIDER_SITE_OTHER)
Admission: RE | Admit: 2019-05-09 | Discharge: 2019-05-09 | Disposition: A | Discharge status: Home / Self Care | Payer: Medicare Other | Source: Ambulatory Visit | Attending: Internal Medicine | Admitting: Internal Medicine

## 2019-05-09 VITALS — BP 118/78 | HR 67 | Temp 98.4°F | Resp 16 | Ht 63.6 in | Wt 143.8 lb

## 2019-05-09 DIAGNOSIS — M5416 Radiculopathy, lumbar region: Secondary | ICD-10-CM

## 2019-05-09 DIAGNOSIS — M545 Low back pain: Secondary | ICD-10-CM | POA: Diagnosis not present

## 2019-05-09 HISTORY — DX: Radiculopathy, lumbar region: M54.16

## 2019-05-09 MED ORDER — METHYLPREDNISOLONE 4 MG PO TBPK: ORAL_TABLET | ORAL | 0 refills | Status: DC

## 2019-05-09 NOTE — Assessment & Plan Note (Signed)
X-ray of lumbar spine today Will likely need an MRI-we will await x-ray results Medrol Dosepak Tylenol as needed Continue TENS unit Avoid bending, lifting and twisting We will refer to PT

## 2019-05-09 NOTE — Patient Instructions (Signed)
Have a xray today.    I will order the MRI  Take the medrol dose pak   Avoid bending, lifting and twisting.     A referral was ordered for PT.

## 2019-05-10 ENCOUNTER — Encounter: Payer: Self-pay | Admitting: Physical Therapy

## 2019-05-10 ENCOUNTER — Encounter: Payer: Self-pay | Admitting: Internal Medicine

## 2019-05-10 ENCOUNTER — Other Ambulatory Visit: Payer: Self-pay | Admitting: Internal Medicine

## 2019-05-10 ENCOUNTER — Ambulatory Visit: Payer: Medicare Other | Attending: Internal Medicine | Admitting: Physical Therapy

## 2019-05-10 DIAGNOSIS — M5441 Lumbago with sciatica, right side: Secondary | ICD-10-CM | POA: Diagnosis not present

## 2019-05-10 DIAGNOSIS — R29898 Other symptoms and signs involving the musculoskeletal system: Secondary | ICD-10-CM | POA: Diagnosis not present

## 2019-05-10 DIAGNOSIS — R262 Difficulty in walking, not elsewhere classified: Secondary | ICD-10-CM | POA: Diagnosis not present

## 2019-05-10 DIAGNOSIS — M5136 Other intervertebral disc degeneration, lumbar region: Secondary | ICD-10-CM

## 2019-05-10 NOTE — Therapy (Signed)
[facility] [address]  [place], [place], [address] Phone: [phone]   Fax:  [phone]  Physical Therapy Treatment  Patient Details  Name: Sarah Valentine MRN: [id number] Date of Birth: 1951/08/10 Referring Provider (PT): Billey Gosling, MD   Encounter Date: 05/10/2019  PT End of Session - 05/10/19 1445    Visit Number  1    Number of Visits  13    Date for PT Re-Evaluation  06/21/19    Authorization Type  Medicare & BCBS    PT Start Time  3903    PT Stop Time  1437    PT Time Calculation (min)  39 min    Activity Tolerance  Patient tolerated treatment well;Patient limited by pain    Behavior During Therapy  [facility] for tasks assessed/performed       Past Medical History:  Diagnosis Date  . [place] 2010  . BCC (basal cell carcinoma), trunk 08/2013   removed from back ([place])  . GERD (gastroesophageal reflux disease)   . Hx of adenomatous colonic polyps 12/2008  . Hyperlipidemia   . Hypertension   . 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[place]) 2011   Melanoma in situ (L) buttock, dr Delman Cheadle  . Migraines   . Nephritis, hereditary    familial chronic nephritis  . 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 ABDOMINAL HYSTERECTOMY  1993  . BASAL CELL CARCINOMA EXCISION  08/2013   back  . CHOLECYSTECTOMY  2010  . COLONOSCOPY W/ BIOPSIES AND POLYPECTOMY  12/2008   polyp removed  . MELANOMA EXCISION Left 2011   buttock  . Perinephtric abscess with peritonitis  1980  . SQUAMOUS CELL CARCINOMA EXCISION  01/2014   back    There were no vitals filed for this visit.  Subjective Assessment - 05/10/19 1400    Subjective  Patient reports R sided LB/buttock pain radiating down posterior leg and to lateral shin. Pain originally started in February/March and got better with meds. Flared up again after playing at the beach 2 weeks ago. Had  tried to do exercises for it and it got worse. Usually walks 5 days a weeks, 2 miles/day. Denies NT. Pain is constant. Worse with sitting. Better with standing. Would like to avoid surgery.    Pertinent History  nephritis, migraines, melanoma- excised, HTN, HLD, GERD    Limitations  Sitting;Lifting;Standing;Walking;House hold activities    How long can you sit comfortably?  almost immediately    How long can you stand comfortably?  5 min    How long can you walk comfortably?  unsure    Diagnostic tests  05/09/19 lumbar xray: Degenerative disc disease changes of the lumbar spine.    Patient Stated Goals  get the pain gone    Currently in Pain?  Yes    Pain Score  6     Pain Location  Buttocks    Pain Orientation  Right;Posterior    Pain Descriptors / Indicators  Sharp    Pain Type  Chronic pain;Acute pain         [facility] PT Assessment - 05/10/19 1412      Assessment   Medical Diagnosis  Lumbar Radiculopathy    Referring Provider (PT)  Billey Gosling, MD    Onset Date/Surgical Date  04/26/19    Next MD Visit  not scheduled  Precautions   Precautions  --   report of orthostasis with position changes     Restrictions   Weight Bearing Restrictions  No      Balance Screen   Has the patient fallen in the past 6 months  No    Has the patient had a decrease in activity level because of a fear of falling?   No    Is the patient reluctant to leave their home because of a fear of falling?   No      Home Film/video editor residence    Living Arrangements  Spouse/significant other    Available Help at Discharge  Family    Type of [place] Access  Level entry    [place] to live on main level with bedroom/bathroom      Prior Function   Level of Independence  Independent    Vocation  Retired    Leisure  walking, playing with grandkids      Cognition   Overall Cognitive Status  Within Functional Limits for tasks assessed       Observation/Other Assessments   Observations  very uncomfortable, changing positions frequently    Focus on Therapeutic Outcomes (FOTO)   Lumbar: 48 (52% limited) 37% predicted      Sensation   Light Touch  Appears Intact      Coordination   Gross Motor Movements are Fluid and Coordinated  Yes      Posture/Postural Control   Posture/Postural Control  Postural limitations    Postural Limitations  Rounded Shoulders;Forward head;Weight shift right;Weight shift left      ROM / Strength   AROM / PROM / Strength  AROM;Strength      AROM   AROM Assessment Site  Lumbar    Lumbar Flexion  toes   mild increase in pain   Lumbar Extension  mildly limited    Lumbar - Right Side Bend  jt line    Lumbar - Left Side Bend  jt line    Lumbar - Right Rotation  WFL    Lumbar - Left Rotation  [facility]      Strength   Strength Assessment Site  Hip;Knee;Ankle    Right/Left Hip  Right;Left    Right Hip Flexion  5/5    Right Hip ABduction  4+/5    Right Hip ADduction  4+/5    Left Hip Flexion  4+/5    Left Hip ABduction  4+/5    Left Hip ADduction  4+/5    Right/Left Knee  Right;Left    Right Knee Flexion  4+/5    Right Knee Extension  4+/5    Left Knee Flexion  4+/5    Left Knee Extension  4+/5    Right/Left Ankle  Right;Left    Right Ankle Dorsiflexion  4+/5    Right Ankle Plantar Flexion  4/5    Left Ankle Dorsiflexion  4+/5    Left Ankle Plantar Flexion  4/5      Flexibility   Soft Tissue Assessment /Muscle Length  yes    Hamstrings  moderately tight R    Piriformis  severely tight on R      Palpation   SI assessment   ASIS and PSIS in normal alignment    Palpation comment  mildly tender in R superiro glute; most tenderness over R SIJ      Special Tests  Special Tests  Lumbar    Lumbar Tests  Straight Leg Raise      Straight Leg Raise   Findings  Positive    Side   Right    Comment  mild pain in LB and buttock      Ambulation/Gait   Gait Pattern  Step-through  pattern;Decreased stance time - right;Decreased arm swing - left;Decreased weight shift to right    Ambulation Surface  Level;Indoor    Gait velocity  [facility]                           PT Education - 05/10/19 1443    Education Details  prognosis, POC, HEP    Person(s) Educated  Patient    Methods  Explanation;Demonstration;Verbal cues;Tactile cues;Handout    Comprehension  Verbalized understanding;Returned demonstration       PT Short Term Goals - 05/10/19 1454      PT SHORT TERM GOAL #1   Title  Patient to be independent with initial HEP.    Time  3    Period  Weeks    Status  New    Target Date  05/31/19        PT Long Term Goals - 05/10/19 1455      PT LONG TERM GOAL #1   Title  Patient to be independent with advanced HEP.    Time  6    Period  Weeks    Status  New    Target Date  06/21/19      PT LONG TERM GOAL #2   Title  Patient to demonstrate lumbar AROM WFL and without pain limiting.    Time  6    Period  Weeks    Status  New    Target Date  06/21/19      PT LONG TERM GOAL #3   Title  Patient to demonstrate R HS and piriformis flexibility without restrictions.    Time  6    Period  Weeks    Target Date  06/21/19      PT LONG TERM GOAL #4   Title  Patient to report tolerance of 1 hour of sitting without pain limiting.    Time  6    Period  Weeks    Status  New    Target Date  06/21/19      PT LONG TERM GOAL #5   Title  Patient to report 1 week of walking for exercise with <3/10 pain.    Time  6    Period  Weeks    Status  New    Target Date  06/21/19            Plan - 05/10/19 1445    Clinical Impression Statement  Patient is a 67y/o F presenting to OPPT with c/o acute flare of R sided buttock pain of 2 weeks duration. Pain is located over R buttock with radiation down posterior thigh and lateral lower leg. Denies N/T. Reports pain is constant, worse with sitting. Patient today with mildly painful lumbar AROM, good overall  LE strength, tightness in R hamstring and piriformis, slightly positive R straight leg raise, and TTP over R SIJ and superior glutes. B ASIS and PSIS in good alignment. Educated patient on gentle stretching and strengthening HEP. Patient reported understanding. Would benefit from skilled PT services 2x/week for 6 weeks to address aforementioned impairments.    Personal Factors and Comorbidities  Age;Sex;Comorbidity  3+;Fitness;Past/Current Experience;Time since onset of injury/illness/exacerbation    Comorbidities  nephritis, migraines, melanoma- excised, HTN, HLD, GERD    Examination-Activity Limitations  Sit;Bed Mobility;Bend;Squat;Caring for Others;Stairs;Carry;Stand;Transfers;Lift;Locomotion Level;Reach Overhead    Examination-Participation Restrictions  Church;Cleaning;Shop;Community Activity;Driving;Yard Work;Interpersonal Relationship;Laundry;Meal Prep    Stability/Clinical Decision Making  Evolving/Moderate complexity    Clinical Decision Making  Moderate    Rehab Potential  Good    PT Frequency  2x / week    PT Duration  6 weeks    PT Treatment/Interventions  ADLs/Self Care Home Management;Cryotherapy;Electrical Stimulation;Moist Heat;Traction;Balance training;Therapeutic exercise;Therapeutic activities;Functional mobility training;Stair training;Gait training;Ultrasound;Neuromuscular re-education;Patient/family education;Manual techniques;Taping;Energy conservation;Dry needling;Passive range of motion    PT Next Visit Plan  reassess HEP    Consulted and Agree with Plan of Care  Patient       Patient will benefit from skilled therapeutic intervention in order to improve the following deficits and impairments:  Decreased activity tolerance, Decreased strength, Pain, Difficulty walking, Decreased range of motion, Improper body mechanics, Postural dysfunction, Impaired flexibility  Visit Diagnosis: 1. Acute right-sided low back pain with right-sided sciatica   2. Difficulty in walking, not  elsewhere classified   3. Other symptoms and signs involving the musculoskeletal system        Problem List Patient Active Problem List   Diagnosis Date Noted  . Lumbar radiculopathy 05/09/2019  . Frequent UTI 08/29/2018  . Left foot pain 07/25/2018  . Right ankle pain 04/23/2017  . Pain of right heel 04/23/2017  . Prediabetes 10/07/2016  . Lung nodule, solitary 10/07/2016  . Family history of breast cancer 11/25/2015  . 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 Incomplete RBBB   . GERD (gastroesophageal reflux disease)   . Hypertension   . Hyperlipidemia   . 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[place])   . Migraines   . BARRETTS ESOPHAGUS 09/05/2010  . PERSONAL HISTORY OF COLONIC POLYPS 09/05/2010    Manuela Neptune 05/10/2019, 2:57 PM  [facility] [address]  [place], [place], [address] Phone: [phone]   Fax:  [phone]  Name: Sarah Valentine MRN: [id number] Date of Birth: 09-08-1951

## 2019-05-10 NOTE — Therapy (Signed)
[facility] [address]  [place], [place], [address] Phone: [phone]   Fax:  [phone]  Physical Therapy Evaluation  Patient Details  Name: Sarah Valentine MRN: [id number] Date of Birth: [birth year] Referring Provider (PT): Billey Gosling, MD   Encounter Date: 05/10/2019  PT End of Session - 05/10/19 1445    Visit Number  1    Number of Visits  13    Date for PT Re-Evaluation  06/21/19    Authorization Type  Medicare & BCBS    PT Start Time  0355    PT Stop Time  1437    PT Time Calculation (min)  39 min    Activity Tolerance  Patient tolerated treatment well;Patient limited by pain    Behavior During Therapy  [facility] for tasks assessed/performed       Past Medical History:  Diagnosis Date  . [place] 2010  . BCC (basal cell carcinoma), trunk 08/2013   removed from back (gould)  . GERD (gastroesophageal reflux disease)   . Hx of adenomatous colonic polyps 12/2008  . Hyperlipidemia   . Hypertension   . 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 Migraines   . Nephritis, hereditary    familial chronic nephritis  . 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 ABDOMINAL HYSTERECTOMY  1993  . BASAL CELL CARCINOMA EXCISION  08/2013   back  . CHOLECYSTECTOMY  2010  . COLONOSCOPY W/ BIOPSIES AND POLYPECTOMY  12/2008   polyp removed  . MELANOMA EXCISION Left 2011   buttock  . Perinephtric abscess with peritonitis  1980  . SQUAMOUS CELL CARCINOMA EXCISION  01/2014   back    There were no vitals filed for this visit.   Subjective Assessment - 05/10/19 1400    Subjective  Patient reports R sided LB/buttock pain radiating down posterior leg and to lateral shin. Pain originally started in February/March and got better with meds. Flared up again after playing at the beach 2 weeks ago.  Had tried to do exercises for it and it got worse. Usually walks 5 days a weeks, 2 miles/day. Denies NT. Pain is constant. Worse with sitting. Better with standing. Would like to avoid surgery.    Pertinent History  nephritis, migraines, melanoma- excised, HTN, HLD, GERD    Limitations  Sitting;Lifting;Standing;Walking;House hold activities    How long can you sit comfortably?  almost immediately    How long can you stand comfortably?  5 min    How long can you walk comfortably?  unsure    Diagnostic tests  05/09/19 lumbar xray: Degenerative disc disease changes of the lumbar spine.    Patient Stated Goals  get the pain gone    Currently in Pain?  Yes    Pain Score  6     Pain Location  Buttocks    Pain Orientation  Right;Posterior    Pain Descriptors / Indicators  Sharp    Pain Type  Chronic pain;Acute pain         [facility] PT Assessment - 05/10/19 1412      Assessment   Medical Diagnosis  Lumbar Radiculopathy    Referring Provider (PT)  Billey Gosling, MD    Onset Date/Surgical Date  04/26/19    Next MD Visit  not scheduled  Precautions   Precautions  --   report of orthostasis with position changes     Restrictions   Weight Bearing Restrictions  No      Balance Screen   Has the patient fallen in the past 6 months  No    Has the patient had a decrease in activity level because of a fear of falling?   No    Is the patient reluctant to leave their home because of a fear of falling?   No      Home Film/video editor residence    Living Arrangements  Spouse/significant other    Available Help at Discharge  Family    Type of [place] Access  Level entry    [place] to live on main level with bedroom/bathroom      Prior Function   Level of Independence  Independent    Vocation  Retired    Leisure  walking, playing with grandkids      Cognition   Overall Cognitive Status  Within Functional Limits for tasks assessed       Observation/Other Assessments   Observations  very uncomfortable, changing positions frequently    Focus on Therapeutic Outcomes (FOTO)   Lumbar: 48 (52% limited) 37% predicted      Sensation   Light Touch  Appears Intact      Coordination   Gross Motor Movements are Fluid and Coordinated  Yes      Posture/Postural Control   Posture/Postural Control  Postural limitations    Postural Limitations  Rounded Shoulders;Forward head;Weight shift right;Weight shift left      ROM / Strength   AROM / PROM / Strength  AROM;Strength      AROM   AROM Assessment Site  Lumbar    Lumbar Flexion  toes   mild increase in pain   Lumbar Extension  mildly limited    Lumbar - Right Side Bend  jt line    Lumbar - Left Side Bend  jt line    Lumbar - Right Rotation  WFL    Lumbar - Left Rotation  [facility]      Strength   Strength Assessment Site  Hip;Knee;Ankle    Right/Left Hip  Right;Left    Right Hip Flexion  5/5    Right Hip ABduction  4+/5    Right Hip ADduction  4+/5    Left Hip Flexion  4+/5    Left Hip ABduction  4+/5    Left Hip ADduction  4+/5    Right/Left Knee  Right;Left    Right Knee Flexion  4+/5    Right Knee Extension  4+/5    Left Knee Flexion  4+/5    Left Knee Extension  4+/5    Right/Left Ankle  Right;Left    Right Ankle Dorsiflexion  4+/5    Right Ankle Plantar Flexion  4/5    Left Ankle Dorsiflexion  4+/5    Left Ankle Plantar Flexion  4/5      Flexibility   Soft Tissue Assessment /Muscle Length  yes    Hamstrings  moderately tight R    Piriformis  severely tight on R      Palpation   SI assessment   ASIS and PSIS in normal alignment    Palpation comment  mildly tender in R superiro glute; most tenderness over R SIJ      Special Tests  Special Tests  Lumbar    Lumbar Tests  Straight Leg Raise      Straight Leg Raise   Findings  Positive    Side   Right    Comment  mild pain in LB and buttock      Ambulation/Gait   Gait Pattern  Step-through  pattern;Decreased stance time - right;Decreased arm swing - left;Decreased weight shift to right    Ambulation Surface  Level;Indoor    Gait velocity  WFL                Objective measurements completed on examination: See above findings.              PT Education - 05/10/19 1443    Education Details  prognosis, POC, HEP    Person(s) Educated  Patient    Methods  Explanation;Demonstration;Verbal cues;Tactile cues;Handout    Comprehension  Verbalized understanding;Returned demonstration       PT Short Term Goals - 05/10/19 1454      PT SHORT TERM GOAL #1   Title  Patient to be independent with initial HEP.    Time  3    Period  Weeks    Status  New    Target Date  05/31/19        PT Long Term Goals - 05/10/19 1455      PT LONG TERM GOAL #1   Title  Patient to be independent with advanced HEP.    Time  6    Period  Weeks    Status  New    Target Date  06/21/19      PT LONG TERM GOAL #2   Title  Patient to demonstrate lumbar AROM WFL and without pain limiting.    Time  6    Period  Weeks    Status  New    Target Date  06/21/19      PT LONG TERM GOAL #3   Title  Patient to demonstrate R HS and piriformis flexibility without restrictions.    Time  6    Period  Weeks    Target Date  06/21/19      PT LONG TERM GOAL #4   Title  Patient to report tolerance of 1 hour of sitting without pain limiting.    Time  6    Period  Weeks    Status  New    Target Date  06/21/19      PT LONG TERM GOAL #5   Title  Patient to report 1 week of walking for exercise with <3/10 pain.    Time  6    Period  Weeks    Status  New    Target Date  06/21/19             Plan - 05/10/19 1445    Clinical Impression Statement  Patient is a 67y/o F presenting to OPPT with c/o acute flare of R sided buttock pain of 2 weeks duration. Pain is located over R buttock with radiation down posterior thigh and lateral lower leg. Denies N/T. Reports pain is constant, worse  with sitting. Patient today with mildly painful lumbar AROM, good overall LE strength, tightness in R hamstring and piriformis, slightly positive R straight leg raise, and TTP over R SIJ and superior glutes. B ASIS and PSIS in good alignment. Educated patient on gentle stretching and strengthening HEP. Patient reported understanding. Would benefit from skilled PT services 2x/week for 6 weeks to address  aforementioned impairments.    Personal Factors and Comorbidities  Age;Sex;Comorbidity 3+;Fitness;Past/Current Experience;Time since onset of injury/illness/exacerbation    Comorbidities  nephritis, migraines, melanoma- excised, HTN, HLD, GERD    Examination-Activity Limitations  Sit;Bed Mobility;Bend;Squat;Caring for Others;Stairs;Carry;Stand;Transfers;Lift;Locomotion Level;Reach Overhead    Examination-Participation Restrictions  Church;Cleaning;Shop;Community Activity;Driving;Yard Work;Interpersonal Relationship;Laundry;Meal Prep    Stability/Clinical Decision Making  Evolving/Moderate complexity    Clinical Decision Making  Moderate    Rehab Potential  Good    PT Frequency  2x / week    PT Duration  6 weeks    PT Treatment/Interventions  ADLs/Self Care Home Management;Cryotherapy;Electrical Stimulation;Moist Heat;Traction;Balance training;Therapeutic exercise;Therapeutic activities;Functional mobility training;Stair training;Gait training;Ultrasound;Neuromuscular re-education;Patient/family education;Manual techniques;Taping;Energy conservation;Dry needling;Passive range of motion    PT Next Visit Plan  reassess HEP    Consulted and Agree with Plan of Care  Patient       Patient will benefit from skilled therapeutic intervention in order to improve the following deficits and impairments:  Decreased activity tolerance, Decreased strength, Pain, Difficulty walking, Decreased range of motion, Improper body mechanics, Postural dysfunction, Impaired flexibility  Visit Diagnosis: 1. Acute right-sided  low back pain with right-sided sciatica   2. Difficulty in walking, not elsewhere classified   3. Other symptoms and signs involving the musculoskeletal system        Problem List Patient Active Problem List   Diagnosis Date Noted  . Lumbar radiculopathy 05/09/2019  . Frequent UTI 08/29/2018  . Left foot pain 07/25/2018  . Right ankle pain 04/23/2017  . Pain of right heel 04/23/2017  . Prediabetes 10/07/2016  . Lung nodule, solitary 10/07/2016  . Family history of breast cancer 11/25/2015  . 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 Incomplete RBBB   . GERD (gastroesophageal reflux disease)   . Hypertension   . Hyperlipidemia   . 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[place])   . Migraines   . BARRETTS ESOPHAGUS 09/05/2010  . PERSONAL HISTORY OF COLONIC POLYPS 09/05/2010    Janene Harvey, PT, DPT 05/10/19 2:58 PM    [facility] [address]  [address] [place], [place], [address] Phone: [phone]   Fax:  [phone]  Name: Sarah Valentine MRN: [id number] Date of Birth: 1951/04/20

## 2019-05-15 ENCOUNTER — Ambulatory Visit: Payer: Medicare Other

## 2019-05-15 ENCOUNTER — Other Ambulatory Visit: Payer: Self-pay

## 2019-05-15 DIAGNOSIS — R29898 Other symptoms and signs involving the musculoskeletal system: Secondary | ICD-10-CM

## 2019-05-15 DIAGNOSIS — M5441 Lumbago with sciatica, right side: Secondary | ICD-10-CM | POA: Diagnosis not present

## 2019-05-15 DIAGNOSIS — R262 Difficulty in walking, not elsewhere classified: Secondary | ICD-10-CM | POA: Diagnosis not present

## 2019-05-15 NOTE — Therapy (Signed)
[facility] [address]  [place], [place], [address] Phone: [phone]   Fax:  [phone]  Physical Therapy Treatment  Patient Details  Name: Sarah Valentine MRN: [id number] Date of Birth: [birth year] Referring Provider (PT): Billey Gosling, MD   Encounter Date: 05/15/2019  PT End of Session - 05/15/19 1326    Visit Number  2    Number of Visits  13    Date for PT Re-Evaluation  06/21/19    Authorization Type  Medicare & BCBS    PT Start Time  1315    PT Stop Time  1410    PT Time Calculation (min)  55 min    Activity Tolerance  Patient tolerated treatment well;Patient limited by pain    Behavior During Therapy  [facility] for tasks assessed/performed       Past Medical History:  Diagnosis Date  . [place] 2010  . BCC (basal cell carcinoma), trunk 08/2013   removed from back (gould)  . GERD (gastroesophageal reflux disease)   . Hx of adenomatous colonic polyps 12/2008  . Hyperlipidemia   . Hypertension   . 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[place]) 2011   Melanoma in situ (L) buttock, dr Delman Cheadle  . Migraines   . Nephritis, hereditary    familial chronic nephritis  . 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 ABDOMINAL HYSTERECTOMY  1993  . BASAL CELL CARCINOMA EXCISION  08/2013   back  . CHOLECYSTECTOMY  2010  . COLONOSCOPY W/ BIOPSIES AND POLYPECTOMY  12/2008   polyp removed  . MELANOMA EXCISION Left 2011   buttock  . Perinephtric abscess with peritonitis  1980  . SQUAMOUS CELL CARCINOMA EXCISION  01/2014   back    There were no vitals filed for this visit.  Subjective Assessment - 05/15/19 1318    Subjective  Pt. noting no issues with HEP.  Does feel improvement in pain levels however unsure if this is due to HEP activities or "cortisone pill".    Pertinent History  nephritis, migraines, melanoma- excised, HTN, HLD, GERD     Diagnostic tests  05/09/19 lumbar xray: Degenerative disc disease changes of the lumbar spine.    Patient Stated Goals  get the pain gone    Currently in Pain?  Yes    Pain Score  7     Pain Location  Buttocks    Pain Orientation  Right;Posterior    Pain Descriptors / Indicators  Sharp                       [facility] Adult PT Treatment/Exercise - 05/15/19 0001      Knee/Hip Exercises: Stretches   Passive Hamstring Stretch  Right;Left;1 rep;30 seconds    Passive Hamstring Stretch Limitations  strap     Piriformis Stretch  Right;Left;2 reps;30 seconds    Piriformis Stretch Limitations  2 reps on R     Other Knee/Hip Stretches  B Figure-4 stretch with towel 2 x 30 sec; 2 reps on R       Knee/Hip Exercises: Aerobic   Nustep  Lvl 3, 6 min (UE/LE)      Knee/Hip Exercises: Standing   Forward Step Up  Right;10 reps;Step Height: 6";Hand Hold: 1    Forward Step Up Limitations  at TM rail       Knee/Hip Exercises: Supine  Bridges  Both;15 reps;Strengthening    Bridges with Clamshell  Both;10 reps;Strengthening   with B hip abd/ER with red TB at knees      Knee/Hip Exercises: Sidelying   Clams  B clam shell with red TB at knees x 12 reps       Modalities   Modalities  Electrical Stimulation      Electrical Stimulation   Electrical Stimulation Location  lumbar spine    Electrical Stimulation Action  IFC    Electrical Stimulation Parameters  to tolerance, 10'    Electrical Stimulation Goals  Pain      Manual Therapy   Manual Therapy  Soft tissue mobilization    Manual therapy comments  L sidelying with R LE elevated on bolster     Soft tissue mobilization  STM to R superior/medial buttocks in area of tenderness                PT Short Term Goals - 05/15/19 1329      PT SHORT TERM GOAL #1   Title  Patient to be independent with initial HEP.    Time  3    Period  Weeks    Status  On-going    Target Date  05/31/19        PT Long Term Goals -  05/15/19 1329      PT LONG TERM GOAL #1   Title  Patient to be independent with advanced HEP.    Time  6    Period  Weeks    Status  On-going      PT LONG TERM GOAL #2   Title  Patient to demonstrate lumbar AROM WFL and without pain limiting.    Time  6    Period  Weeks    Status  On-going      PT LONG TERM GOAL #3   Title  Patient to demonstrate R HS and piriformis flexibility without restrictions.    Time  6    Period  Weeks    Status  On-going      PT LONG TERM GOAL #4   Title  Patient to report tolerance of 1 hour of sitting without pain limiting.    Time  6    Period  Weeks    Status  On-going      PT LONG TERM GOAL #5   Title  Patient to report 1 week of walking for exercise with <3/10 pain.    Time  6    Period  Weeks    Status  On-going            Plan - 05/15/19 1330    Clinical Impression Statement  Pt. noting some relief since starting therapy and unsure if this is from "cortisone pill" or HEP activities.  Sarah Valentine reporting she has been performing HEP at home and demonstrating/verbalizing good understanding today in session.  Did required cueing for proper hold times with bridging today.  MT addressing tender area in R superior buttocks and progressed gluteal strengthening for hopeful normalization of tone.  Did have one episode of orthostates upon standing today which quickly subsided.  Ended session with application of moist heat/E-stim to lumbar spine for reduction in tone and pain.  Will consider update to HEP in coming sessions.    Personal Factors and Comorbidities  Age;Sex;Comorbidity 3+;Fitness;Past/Current Experience;Time since onset of injury/illness/exacerbation    Comorbidities  nephritis, migraines, melanoma- excised, HTN, HLD, GERD    Rehab Potential  Good    PT Treatment/Interventions  ADLs/Self Care Home Management;Cryotherapy;Electrical Stimulation;Moist Heat;Traction;Balance training;Therapeutic exercise;Therapeutic activities;Functional  mobility training;Stair training;Gait training;Ultrasound;Neuromuscular re-education;Patient/family education;Manual techniques;Taping;Energy conservation;Dry needling;Passive range of motion    PT Next Visit Plan  Monitor tolerance to therex; LE flexibility; modalities prn    Consulted and Agree with Plan of Care  Patient       Patient will benefit from skilled therapeutic intervention in order to improve the following deficits and impairments:  Decreased activity tolerance, Decreased strength, Pain, Difficulty walking, Decreased range of motion, Improper body mechanics, Postural dysfunction, Impaired flexibility  Visit Diagnosis: 1. Acute right-sided low back pain with right-sided sciatica   2. Difficulty in walking, not elsewhere classified   3. Other symptoms and signs involving the musculoskeletal system        Problem List Patient Active Problem List   Diagnosis Date Noted  . Lumbar radiculopathy 05/09/2019  . Frequent UTI 08/29/2018  . Left foot pain 07/25/2018  . Right ankle pain 04/23/2017  . Pain of right heel 04/23/2017  . Prediabetes 10/07/2016  . Lung nodule, solitary 10/07/2016  . Family history of breast cancer 11/25/2015  . 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 Incomplete RBBB   . GERD (gastroesophageal reflux disease)   . Hypertension   . Hyperlipidemia   . 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[place])   . Migraines   . BARRETTS ESOPHAGUS 09/05/2010  . PERSONAL HISTORY OF COLONIC POLYPS 09/05/2010    Bess Harvest, PTA 05/15/19 3:22 PM   [facility] [address]  [place], [place], [address] Phone: [phone]   Fax:  [phone]  Name: Sarah Valentine MRN: [id number] Date of Birth: 1951/07/07

## 2019-05-17 ENCOUNTER — Other Ambulatory Visit: Payer: Self-pay

## 2019-05-17 ENCOUNTER — Ambulatory Visit: Payer: Medicare Other | Admitting: Physical Therapy

## 2019-05-17 ENCOUNTER — Encounter: Payer: Self-pay | Admitting: Physical Therapy

## 2019-05-17 DIAGNOSIS — R262 Difficulty in walking, not elsewhere classified: Secondary | ICD-10-CM

## 2019-05-17 DIAGNOSIS — M5441 Lumbago with sciatica, right side: Secondary | ICD-10-CM | POA: Diagnosis not present

## 2019-05-17 DIAGNOSIS — R29898 Other symptoms and signs involving the musculoskeletal system: Secondary | ICD-10-CM

## 2019-05-17 NOTE — Therapy (Signed)
[facility] [address]  [place], [place], [address] Phone: [phone]   Fax:  [phone]  Physical Therapy Treatment  Patient Details  Name: Sarah Valentine MRN: [id number] Date of Birth: [birth year] Referring Provider (PT): Billey Gosling, MD   Encounter Date: 05/17/2019  PT End of Session - 05/17/19 1753    Visit Number  3    Number of Visits  13    Date for PT Re-Evaluation  06/21/19    Authorization Type  Medicare & BCBS    PT Start Time  1655    PT Stop Time  1746    PT Time Calculation (min)  51 min    Activity Tolerance  Patient tolerated treatment well    Behavior During Therapy  [facility] for tasks assessed/performed       Past Medical History:  Diagnosis Date  . [place] 2010  . BCC (basal cell carcinoma), trunk 08/2013   removed from back (gould)  . GERD (gastroesophageal reflux disease)   . Hx of adenomatous colonic polyps 12/2008  . Hyperlipidemia   . Hypertension   . 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[place]) 2011   Melanoma in situ (L) buttock, dr Delman Cheadle  . Migraines   . Nephritis, hereditary    familial chronic nephritis  . 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 ABDOMINAL HYSTERECTOMY  1993  . BASAL CELL CARCINOMA EXCISION  08/2013   back  . CHOLECYSTECTOMY  2010  . COLONOSCOPY W/ BIOPSIES AND POLYPECTOMY  12/2008   polyp removed  . MELANOMA EXCISION Left 2011   buttock  . Perinephtric abscess with peritonitis  1980  . SQUAMOUS CELL CARCINOMA EXCISION  01/2014   back    There were no vitals filed for this visit.  Subjective Assessment - 05/17/19 1656    Subjective  Reports that she still has pain but notices that she is getting less pain down her lower leg. Has brought in her TENS unit today.    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    Patient Stated Goals  get the pain gone    Currently in Pain?  Yes    Pain Score  4     Pain Location  Back    Pain Orientation  Right    Pain Descriptors / Indicators  Aching    Pain Type  Acute pain;Chronic pain                       [facility] Adult PT Treatment/Exercise - 05/17/19 0001      Exercises   Exercises  Knee/Hip;Lumbar      Lumbar Exercises: Supine   Pelvic Tilt  15 reps    Pelvic Tilt Limitations  anterior/posterior tilting    difficulty with movement pattern   Dead Bug  10 reps    Dead Bug Limitations  orange pball on belly    Other Supine Lumbar Exercises  LTR x10 to tolerance   good ROM     Lumbar Exercises: Quadruped   Madcat/Old Horse  10 reps    Madcat/Old Horse Limitations  visible decreased posterior tilting- no pain      Knee/Hip Exercises: Stretches   Passive Hamstring Stretch  Right;30 seconds;2 reps  Passive Hamstring Stretch Limitations  strap     Piriformis Stretch  Right;1 rep;30 seconds    Piriformis Stretch Limitations  KTOS to tolerance    Other Knee/Hip Stretches  R figure 4 stretch with LEs elevated and strap assistance 30"      Knee/Hip Exercises: Aerobic   Nustep  Lvl 4, 6 min (UE/LE)      Knee/Hip Exercises: Standing   Functional Squat  1 set;10 reps    Functional Squat Limitations  squat touching bottom on foam pad   c/o sharp pain in LB without airex; cues for R wt shift     Knee/Hip Exercises: Supine   Bridges  Strengthening;Both;1 set;10 reps    Bridges Limitations  good form and tolerance    Bridges with Clamshell  Both;10 reps;Strengthening   red TB above knees   Other Supine Knee/Hip Exercises  straight leg bridge with orange pball x10   cues for core contraction     Knee/Hip Exercises: Sidelying   Clams  B clam shell with red TB at knees x 15 reps    cues to avoid jerking and trunk rotation     Manual Therapy   Manual Therapy  Soft  tissue mobilization;Myofascial release    Manual therapy comments  prone    Soft tissue mobilization  STM to R piriformis and inferior glute- minimal tenderness but palpable soft tissue and twitch response    Myofascial Release  manual TPR to R priformis             PT Education - 05/17/19 1753    Education Details  update to HEP; edu on personal TENS use at home for pain relief.    Person(s) Educated  Patient    Methods  Explanation;Demonstration;Tactile cues;Verbal cues;Handout    Comprehension  Verbalized understanding;Returned demonstration       PT Short Term Goals - 05/17/19 1754      PT SHORT TERM GOAL #1   Title  Patient to be independent with initial HEP.    Time  3    Period  Weeks    Status  Achieved    Target Date  05/31/19        PT Long Term Goals - 05/15/19 1329      PT LONG TERM GOAL #1   Title  Patient to be independent with advanced HEP.    Time  6    Period  Weeks    Status  On-going      PT LONG TERM GOAL #2   Title  Patient to demonstrate lumbar AROM WFL and without pain limiting.    Time  6    Period  Weeks    Status  On-going      PT LONG TERM GOAL #3   Title  Patient to demonstrate R HS and piriformis flexibility without restrictions.    Time  6    Period  Weeks    Status  On-going      PT LONG TERM GOAL #4   Title  Patient to report tolerance of 1 hour of sitting without pain limiting.    Time  6    Period  Weeks    Status  On-going      PT LONG TERM GOAL #5   Title  Patient to report 1 week of walking for exercise with <3/10 pain.    Time  6    Period  Weeks    Status  On-going  Plan - 05/17/19 1754    Clinical Impression Statement  Patient arrived to session with report of improving LBP. Patient demonstrating visible improvement in R HS and piriformis flexibility since inial eval. Patient now reporting more stretch with figure 4 stretch with LE elevated. Tolerated bridge with banded resistance with good form  and reported benefit from LTR stretch, thus added these changes to HEP. Patient reported understanding. Patient reporting lightheadedness d/t report of orthostatic hypotension that she believes she is managing well, thus has patient change positions slowly. Demonstrated difficulty with supine pelvic tilts with better understanding with cat/cow. However, showing decreased posterior pelvic tilting ROM. Ended session with STM and manual TPR to R inferior glute and piriformis- palpable soft tissue restriction in these areas. Patient without complaints at end of session.    Comorbidities  nephritis, migraines, melanoma- excised, HTN, HLD, GERD    Rehab Potential  Good    PT Treatment/Interventions  ADLs/Self Care Home Management;Cryotherapy;Electrical Stimulation;Moist Heat;Traction;Balance training;Therapeutic exercise;Therapeutic activities;Functional mobility training;Stair training;Gait training;Ultrasound;Neuromuscular re-education;Patient/family education;Manual techniques;Taping;Energy conservation;Dry needling;Passive range of motion    PT Next Visit Plan  Monitor tolerance to therex; LE flexibility; modalities prn    Consulted and Agree with Plan of Care  Patient       Patient will benefit from skilled therapeutic intervention in order to improve the following deficits and impairments:  Decreased activity tolerance, Decreased strength, Pain, Difficulty walking, Decreased range of motion, Improper body mechanics, Postural dysfunction, Impaired flexibility  Visit Diagnosis: 1. Acute right-sided low back pain with right-sided sciatica   2. Difficulty in walking, not elsewhere classified   3. Other symptoms and signs involving the musculoskeletal system        Problem List Patient Active Problem List   Diagnosis Date Noted  . Lumbar radiculopathy 05/09/2019  . Frequent UTI 08/29/2018  . Left foot pain 07/25/2018  . Right ankle pain 04/23/2017  . Pain of right heel 04/23/2017  .  Prediabetes 10/07/2016  . Lung nodule, solitary 10/07/2016  . Family history of breast cancer 11/25/2015  . 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 Incomplete RBBB   . GERD (gastroesophageal reflux disease)   . Hypertension   . Hyperlipidemia   . 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[place])   . Migraines   . BARRETTS ESOPHAGUS 09/05/2010  . PERSONAL HISTORY OF COLONIC POLYPS 09/05/2010    Janene Harvey, PT, DPT 05/17/19 5:56 PM    [facility] [address]  [place], [place], [address] Phone: [phone]   Fax:  [phone]  Name: Sharay Bellissimo MRN: [id number] Date of Birth: Jan 28, 1951

## 2019-05-22 ENCOUNTER — Ambulatory Visit: Payer: Medicare Other

## 2019-05-22 ENCOUNTER — Other Ambulatory Visit: Payer: Self-pay

## 2019-05-22 DIAGNOSIS — M5441 Lumbago with sciatica, right side: Secondary | ICD-10-CM

## 2019-05-22 DIAGNOSIS — R262 Difficulty in walking, not elsewhere classified: Secondary | ICD-10-CM | POA: Diagnosis not present

## 2019-05-22 DIAGNOSIS — R29898 Other symptoms and signs involving the musculoskeletal system: Secondary | ICD-10-CM

## 2019-05-22 NOTE — Therapy (Signed)
[facility] [address]  [place], [place], [address] Phone: [phone]   Fax:  [phone]  Physical Therapy Treatment  Patient Details  Name: Sarah Valentine MRN: [id number] Date of Birth: January 03, 1951 Referring Provider (PT): Billey Gosling, MD   Encounter Date: 05/22/2019  PT End of Session - 05/22/19 1703    Visit Number  4    Number of Visits  13    Date for PT Re-Evaluation  06/21/19    Authorization Type  Medicare & BCBS    PT Start Time  3810    PT Stop Time  1740    PT Time Calculation (min)  42 min    Activity Tolerance  Patient tolerated treatment well    Behavior During Therapy  [facility] for tasks assessed/performed       Past Medical History:  Diagnosis Date  . [place] 2010  . BCC (basal cell carcinoma), trunk 08/2013   removed from back (gould)  . GERD (gastroesophageal reflux disease)   . Hx of adenomatous colonic polyps 12/2008  . Hyperlipidemia   . Hypertension   . 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[place]) 2011   Melanoma in situ (L) buttock, dr Delman Cheadle  . Migraines   . Nephritis, hereditary    familial chronic nephritis  . 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 ABDOMINAL HYSTERECTOMY  1993  . BASAL CELL CARCINOMA EXCISION  08/2013   back  . CHOLECYSTECTOMY  2010  . COLONOSCOPY W/ BIOPSIES AND POLYPECTOMY  12/2008   polyp removed  . MELANOMA EXCISION Left 2011   buttock  . Perinephtric abscess with peritonitis  1980  . SQUAMOUS CELL CARCINOMA EXCISION  01/2014   back    There were no vitals filed for this visit.  Subjective Assessment - 05/22/19 1702    Subjective  Getting an MRI on Sunday.    Pertinent History  nephritis, migraines, melanoma- excised, HTN, HLD, GERD    Diagnostic tests  05/09/19 lumbar xray: Degenerative disc disease changes of the lumbar spine.    Patient Stated Goals  get the pain  gone    Currently in Pain?  No/denies    Pain Score  0-No pain    Multiple Pain Sites  No                       [facility] Adult PT Treatment/Exercise - 05/22/19 0001      Lumbar Exercises: Stretches   Lumbar Stabilization Level 1  3 reps;20 seconds    Lumbar Stabilization Level 1 Limitations  green p-ball rollouts       Lumbar Exercises: Quadruped   Madcat/Old Horse  10 reps    Straight Leg Raise  10 reps;3 seconds    Straight Leg Raises Limitations  with peanut p-ball support      Knee/Hip Exercises: Stretches   Passive Hamstring Stretch  Right;30 seconds;2 reps    Passive Hamstring Stretch Limitations  strap     Piriformis Stretch  Right;1 rep;30 seconds    Piriformis Stretch Limitations  KTOS to tolerance seated     Other Knee/Hip Stretches  R figure-4 stretch x 30 sec seated       Knee/Hip Exercises: Aerobic   Nustep  Lvl 4, 6 min (UE/LE)      Knee/Hip Exercises: Supine   Bridges with Clamshell  Both;15 reps   +  isometric hip abd/ER into red TB at knees      Knee/Hip Exercises: Sidelying   Clams  B clam shell with red TB at knees x 15 reps                PT Short Term Goals - 05/17/19 1754      PT SHORT TERM GOAL #1   Title  Patient to be independent with initial HEP.    Time  3    Period  Weeks    Status  Achieved    Target Date  05/31/19        PT Long Term Goals - 05/15/19 1329      PT LONG TERM GOAL #1   Title  Patient to be independent with advanced HEP.    Time  6    Period  Weeks    Status  On-going      PT LONG TERM GOAL #2   Title  Patient to demonstrate lumbar AROM WFL and without pain limiting.    Time  6    Period  Weeks    Status  On-going      PT LONG TERM GOAL #3   Title  Patient to demonstrate R HS and piriformis flexibility without restrictions.    Time  6    Period  Weeks    Status  On-going      PT LONG TERM GOAL #4   Title  Patient to report tolerance of 1 hour of sitting without pain limiting.    Time   6    Period  Weeks    Status  On-going      PT LONG TERM GOAL #5   Title  Patient to report 1 week of walking for exercise with <3/10 pain.    Time  6    Period  Weeks    Status  On-going            Plan - 05/22/19 1703    Clinical Impression Statement  Pt. noting she has an MRI scheduled on Sunday.  Pt. noting she can sit without pain 20 min.  Notes ~ 70% improvement in pain levels since starting therapy.  Notes no longer having radicular symptoms down R LE for the last week.  Progressed lumbopelvic strengthening and progressed quadruped posterior chain strengthening activities without pain today.  Progressing toward goals well.    Personal Factors and Comorbidities  Age;Sex;Comorbidity 3+;Fitness;Past/Current Experience;Time since onset of injury/illness/exacerbation    Comorbidities  nephritis, migraines, melanoma- excised, HTN, HLD, GERD    Rehab Potential  Good    PT Treatment/Interventions  ADLs/Self Care Home Management;Cryotherapy;Electrical Stimulation;Moist Heat;Traction;Balance training;Therapeutic exercise;Therapeutic activities;Functional mobility training;Stair training;Gait training;Ultrasound;Neuromuscular re-education;Patient/family education;Manual techniques;Taping;Energy conservation;Dry needling;Passive range of motion    PT Next Visit Plan  Monitor tolerance to therex; LE flexibility; modalities prn    Consulted and Agree with Plan of Care  Patient       Patient will benefit from skilled therapeutic intervention in order to improve the following deficits and impairments:  Decreased activity tolerance, Decreased strength, Pain, Difficulty walking, Decreased range of motion, Improper body mechanics, Postural dysfunction, Impaired flexibility  Visit Diagnosis: 1. Acute right-sided low back pain with right-sided sciatica   2. Difficulty in walking, not elsewhere classified   3. Other symptoms and signs involving the musculoskeletal system        Problem  List Patient Active Problem List   Diagnosis Date Noted  . Lumbar radiculopathy 05/09/2019  . Frequent  UTI 08/29/2018  . Left foot pain 07/25/2018  . Right ankle pain 04/23/2017  . Pain of right heel 04/23/2017  . Prediabetes 10/07/2016  . Lung nodule, solitary 10/07/2016  . Family history of breast cancer 11/25/2015  . 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 Incomplete RBBB   . GERD (gastroesophageal reflux disease)   . Hypertension   . Hyperlipidemia   . 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[place])   . Migraines   . BARRETTS ESOPHAGUS 09/05/2010  . PERSONAL HISTORY OF COLONIC POLYPS 09/05/2010    Bess Harvest, PTA 05/22/19 5:51 PM   [facility] [address]  [place], [place], [address] Phone: [phone]   Fax:  [phone]  Name: Sarah Valentine MRN: [id number] Date of Birth: Mar 21, 1951

## 2019-05-25 ENCOUNTER — Other Ambulatory Visit: Payer: Self-pay

## 2019-05-25 ENCOUNTER — Ambulatory Visit: Payer: Medicare Other | Admitting: Physical Therapy

## 2019-05-25 ENCOUNTER — Encounter: Payer: Self-pay | Admitting: Physical Therapy

## 2019-05-25 DIAGNOSIS — R262 Difficulty in walking, not elsewhere classified: Secondary | ICD-10-CM

## 2019-05-25 DIAGNOSIS — M5441 Lumbago with sciatica, right side: Secondary | ICD-10-CM

## 2019-05-25 DIAGNOSIS — R29898 Other symptoms and signs involving the musculoskeletal system: Secondary | ICD-10-CM | POA: Diagnosis not present

## 2019-05-25 NOTE — Therapy (Signed)
[facility] [address]  [place], [place], [address] Phone: [phone]   Fax:  [phone]  Physical Therapy Treatment  Patient Details  Name: Sarah Valentine MRN: [id number] Date of Birth: 1951/01/31 Referring Provider (PT): Billey Gosling, MD   Encounter Date: 05/25/2019  PT End of Session - 05/25/19 1739    Visit Number  5    Number of Visits  13    Date for PT Re-Evaluation  06/21/19    Authorization Type  Medicare & BCBS    PT Start Time  1701    PT Stop Time  1748    PT Time Calculation (min)  47 min    Activity Tolerance  Patient tolerated treatment well    Behavior During Therapy  [facility] for tasks assessed/performed       Past Medical History:  Diagnosis Date  . [place] 2010  . BCC (basal cell carcinoma), trunk 08/2013   removed from back (gould)  . GERD (gastroesophageal reflux disease)   . Hx of adenomatous colonic polyps 12/2008  . Hyperlipidemia   . Hypertension   . 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[place]) 2011   Melanoma in situ (L) buttock, dr Delman Cheadle  . Migraines   . Nephritis, hereditary    familial chronic nephritis  . 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 ABDOMINAL HYSTERECTOMY  1993  . BASAL CELL CARCINOMA EXCISION  08/2013   back  . CHOLECYSTECTOMY  2010  . COLONOSCOPY W/ BIOPSIES AND POLYPECTOMY  12/2008   polyp removed  . MELANOMA EXCISION Left 2011   buttock  . Perinephtric abscess with peritonitis  1980  . SQUAMOUS CELL CARCINOMA EXCISION  01/2014   back    There were no vitals filed for this visit.  Subjective Assessment - 05/25/19 1704    Subjective  Reports that on Tuesday while performing HEP she felt a cracking in her back while performing LTR. Since then she has felt a discomfort down the R lateral part of her lower leg.    Pertinent History  nephritis, migraines, melanoma- excised,  HTN, HLD, GERD    Diagnostic tests  05/09/19 lumbar xray: Degenerative disc disease changes of the lumbar spine.    Patient Stated Goals  get the pain gone    Currently in Pain?  Yes    Pain Score  4     Pain Location  Back    Pain Orientation  Right    Pain Descriptors / Indicators  Aching    Pain Type  Acute pain;Chronic pain                       [facility] Adult PT Treatment/Exercise - 05/25/19 0001      Lumbar Exercises: Stretches   Passive Hamstring Stretch  Right;Left;1 rep;30 seconds    Passive Hamstring Stretch Limitations  supine with strap      Lumbar Exercises: Aerobic   Nustep  L4 x 6 min (UEs/LEs)      Lumbar Exercises: Supine   Bridge with March  10 reps   visible hip drop     Lumbar Exercises: Sidelying   Other Sidelying Lumbar Exercises  open book stretch x10 each side   cues to avoid shoulder motion only     Lumbar Exercises: Prone   Other Prone Lumbar Exercises  prone on elbows 10x3"  Knee/Hip Exercises: Sidelying   Clams  B clam shell with red TB at knees x 15 reps     Other Sidelying Knee/Hip Exercises  side plank lifts on elbows/knees x10 each side   heavy cues to avoid excess UE support     Knee/Hip Exercises: Prone   Hip Extension  Strengthening;Right;Left;1 set;10 reps    Hip Extension Limitations  with straight knee   cues for maintain hips from rotating     Modalities   Modalities  Moist Heat      Moist Heat Therapy   Number Minutes Moist Heat  10 Minutes    Moist Heat Location  Lumbar Spine      Electrical Stimulation   Electrical Stimulation Location  R LB    Electrical Stimulation Action  IFC    Electrical Stimulation Parameters  Output: 9 to tolerance; 10 min    Electrical Stimulation Goals  Pain             PT Education - 05/25/19 1739    Education Details  update to HEP; edu on use of lumbar roll to increase sitting tolerance    Person(s) Educated  Patient    Methods  Explanation;Demonstration;Tactile  cues;Verbal cues;Handout    Comprehension  Verbalized understanding;Returned demonstration       PT Short Term Goals - 05/17/19 1754      PT SHORT TERM GOAL #1   Title  Patient to be independent with initial HEP.    Time  3    Period  Weeks    Status  Achieved    Target Date  05/31/19        PT Long Term Goals - 05/15/19 1329      PT LONG TERM GOAL #1   Title  Patient to be independent with advanced HEP.    Time  6    Period  Weeks    Status  On-going      PT LONG TERM GOAL #2   Title  Patient to demonstrate lumbar AROM WFL and without pain limiting.    Time  6    Period  Weeks    Status  On-going      PT LONG TERM GOAL #3   Title  Patient to demonstrate R HS and piriformis flexibility without restrictions.    Time  6    Period  Weeks    Status  On-going      PT LONG TERM GOAL #4   Title  Patient to report tolerance of 1 hour of sitting without pain limiting.    Time  6    Period  Weeks    Status  On-going      PT LONG TERM GOAL #5   Title  Patient to report 1 week of walking for exercise with <3/10 pain.    Time  6    Period  Weeks    Status  On-going            Plan - 05/25/19 1740    Clinical Impression Statement  Patient arrived to session with report of an episode of crepitus in LB while performing LTR on Tuesday, and report of slight peripheralization of "discomfort" down R lateral lower leg since then. Worked on prone on elbows for hopeful centralization of symptoms. Patient unsure of benefit, thus administered this exercise as part of HEP for increased practice. Introduced Museum/gallery exhibitions officer with patient demonstrating hip drop on both sides, but able to tolerate this exercise without pain.  Demonstrating improvement in form with clamshells today. Overall patient still with good tolerance for ther-ex despite flare up. D/t patient's slight increase in pain today, ended session with moist heat and e-stim to LB. Patient reporting relief at end of session.     Comorbidities  nephritis, migraines, melanoma- excised, HTN, HLD, GERD    Rehab Potential  Good    PT Treatment/Interventions  ADLs/Self Care Home Management;Cryotherapy;Electrical Stimulation;Moist Heat;Traction;Balance training;Therapeutic exercise;Therapeutic activities;Functional mobility training;Stair training;Gait training;Ultrasound;Neuromuscular re-education;Patient/family education;Manual techniques;Taping;Energy conservation;Dry needling;Passive range of motion    PT Next Visit Plan  Monitor tolerance to therex; LE flexibility; modalities prn    Consulted and Agree with Plan of Care  Patient       Patient will benefit from skilled therapeutic intervention in order to improve the following deficits and impairments:  Decreased activity tolerance, Decreased strength, Pain, Difficulty walking, Decreased range of motion, Improper body mechanics, Postural dysfunction, Impaired flexibility  Visit Diagnosis: 1. Acute right-sided low back pain with right-sided sciatica   2. Difficulty in walking, not elsewhere classified   3. Other symptoms and signs involving the musculoskeletal system        Problem List Patient Active Problem List   Diagnosis Date Noted  . Lumbar radiculopathy 05/09/2019  . Frequent UTI 08/29/2018  . Left foot pain 07/25/2018  . Right ankle pain 04/23/2017  . Pain of right heel 04/23/2017  . Prediabetes 10/07/2016  . Lung nodule, solitary 10/07/2016  . Family history of breast cancer 11/25/2015  . 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 Incomplete RBBB   . GERD (gastroesophageal reflux disease)   . Hypertension   . Hyperlipidemia   . 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[place])   . Migraines   . BARRETTS ESOPHAGUS 09/05/2010  . PERSONAL HISTORY OF COLONIC POLYPS 09/05/2010      Janene Harvey, PT, DPT 05/25/19 6:00 PM   [facility] [address]  [place], [place], [address] Phone: [phone]   Fax:   [phone]  Name: Sarah Valentine MRN: [id number] Date of Birth: [birth year]

## 2019-05-28 ENCOUNTER — Ambulatory Visit
Admission: RE | Admit: 2019-05-28 | Discharge: 2019-05-28 | Disposition: A | Discharge status: Home / Self Care | Payer: Medicare Other | Source: Ambulatory Visit | Attending: Internal Medicine | Admitting: Internal Medicine

## 2019-05-28 ENCOUNTER — Other Ambulatory Visit: Payer: Self-pay

## 2019-05-28 DIAGNOSIS — M5136 Other intervertebral disc degeneration, lumbar region: Secondary | ICD-10-CM

## 2019-05-28 DIAGNOSIS — M48061 Spinal stenosis, lumbar region without neurogenic claudication: Secondary | ICD-10-CM | POA: Diagnosis not present

## 2019-05-29 ENCOUNTER — Encounter: Payer: Self-pay | Admitting: Internal Medicine

## 2019-05-29 ENCOUNTER — Ambulatory Visit: Payer: Medicare Other

## 2019-05-29 DIAGNOSIS — M5416 Radiculopathy, lumbar region: Secondary | ICD-10-CM

## 2019-05-29 DIAGNOSIS — R29898 Other symptoms and signs involving the musculoskeletal system: Secondary | ICD-10-CM

## 2019-05-29 DIAGNOSIS — M5441 Lumbago with sciatica, right side: Secondary | ICD-10-CM | POA: Diagnosis not present

## 2019-05-29 DIAGNOSIS — R262 Difficulty in walking, not elsewhere classified: Secondary | ICD-10-CM | POA: Diagnosis not present

## 2019-05-29 NOTE — Addendum Note (Signed)
Addended by: Binnie Rail on: 05/29/2019 05:08 PM   Modules accepted: Orders

## 2019-05-29 NOTE — Therapy (Signed)
[facility] [address]  [place], [place], [address] Phone: [phone]   Fax:  [phone]  Physical Therapy Treatment  Patient Details  Name: Sarah Valentine MRN: [id number] Date of Birth: April 21, 1951 Referring Provider (PT): Billey Gosling, MD   Encounter Date: 05/29/2019  PT End of Session - 05/29/19 1701    Visit Number  6    Number of Visits  13    Date for PT Re-Evaluation  06/21/19    Authorization Type  Medicare & BCBS    PT Start Time  3235    PT Stop Time  5732   ended visit with 10 min moist heat   PT Time Calculation (min)  48 min    Activity Tolerance  Patient tolerated treatment well    Behavior During Therapy  [facility] for tasks assessed/performed       Past Medical History:  Diagnosis Date  . [place] 2010  . BCC (basal cell carcinoma), trunk 08/2013   removed from back (gould)  . GERD (gastroesophageal reflux disease)   . Hx of adenomatous colonic polyps 12/2008  . Hyperlipidemia   . Hypertension   . 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[place]) 2011   Melanoma in situ (L) buttock, dr Delman Cheadle  . Migraines   . Nephritis, hereditary    familial chronic nephritis  . 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 ABDOMINAL HYSTERECTOMY  1993  . BASAL CELL CARCINOMA EXCISION  08/2013   back  . CHOLECYSTECTOMY  2010  . COLONOSCOPY W/ BIOPSIES AND POLYPECTOMY  12/2008   polyp removed  . MELANOMA EXCISION Left 2011   buttock  . Perinephtric abscess with peritonitis  1980  . SQUAMOUS CELL CARCINOMA EXCISION  01/2014   back    There were no vitals filed for this visit.  Subjective Assessment - 05/29/19 1700    Subjective  Pt. seen today with MRI handout in hand.    Pertinent History  nephritis, migraines, melanoma- excised, HTN, HLD, GERD    Diagnostic tests  05/09/19 lumbar xray: Degenerative disc disease changes of the  lumbar spine.    Patient Stated Goals  get the pain gone    Currently in Pain?  Yes    Pain Score  5     Pain Location  Back    Pain Orientation  Right    Pain Descriptors / Indicators  Aching;Discomfort    Pain Type  Acute pain;Chronic pain    Pain Radiating Towards  Pain radiating down to lateral    Multiple Pain Sites  No                       [facility] Adult PT Treatment/Exercise - 05/29/19 0001      Lumbar Exercises: Stretches   Passive Hamstring Stretch  Right;Left;1 rep;30 seconds    Passive Hamstring Stretch Limitations  supine with strap      Lumbar Exercises: Aerobic   Nustep  L4 x 7 min (UEs/LEs)      Lumbar Exercises: Supine   Bridge  15 reps;5 seconds    Bridge Limitations  B hip abd/ER into red looped TB at knees       Lumbar Exercises: Sidelying   Other Sidelying Lumbar Exercises  open book stretch x15 each side      Lumbar Exercises: Prone   Other Prone  Lumbar Exercises  Prone partial press up 3" x 15 reps     Other Prone Lumbar Exercises  prone on elbows 20 sec       Lumbar Exercises: Quadruped   Madcat/Old Horse  15 reps    Opposite Arm/Leg Raise  Right arm/Left leg;Left arm/Right leg;10 reps      Knee/Hip Exercises: Standing   Forward Step Up  Right;Left;1 set;10 reps;Step Height: 8"    Forward Step Up Limitations  at counter              PT Education - 05/29/19 1739    Education Details  HEP update    Person(s) Educated  Patient    Methods  Explanation;Demonstration;Verbal cues;Handout    Comprehension  Verbalized understanding;Returned demonstration;Verbal cues required       PT Short Term Goals - 05/17/19 1754      PT SHORT TERM GOAL #1   Title  Patient to be independent with initial HEP.    Time  3    Period  Weeks    Status  Achieved    Target Date  05/31/19        PT Long Term Goals - 05/15/19 1329      PT LONG TERM GOAL #1   Title  Patient to be independent with advanced HEP.    Time  6    Period  Weeks     Status  On-going      PT LONG TERM GOAL #2   Title  Patient to demonstrate lumbar AROM WFL and without pain limiting.    Time  6    Period  Weeks    Status  On-going      PT LONG TERM GOAL #3   Title  Patient to demonstrate R HS and piriformis flexibility without restrictions.    Time  6    Period  Weeks    Status  On-going      PT LONG TERM GOAL #4   Title  Patient to report tolerance of 1 hour of sitting without pain limiting.    Time  6    Period  Weeks    Status  On-going      PT LONG TERM GOAL #5   Title  Patient to report 1 week of walking for exercise with <3/10 pain.    Time  6    Period  Weeks    Status  On-going            Plan - 05/29/19 1703    Clinical Impression Statement  Pt. reporting increased pain over weekend however admits to prolonged sitting, "all day" while driving sister to MD appointments on Friday.  Tolerated progression of extension-based exercises well today without increased pain.  Pt. noting radicular symptoms down R lateral LE to start session and centralization of symptoms to end session today.  Will continue to progress toward goals.    Personal Factors and Comorbidities  Age;Sex;Comorbidity 3+;Fitness;Past/Current Experience;Time since onset of injury/illness/exacerbation    Comorbidities  nephritis, migraines, melanoma- excised, HTN, HLD, GERD    Rehab Potential  Good    PT Treatment/Interventions  ADLs/Self Care Home Management;Cryotherapy;Electrical Stimulation;Moist Heat;Traction;Balance training;Therapeutic exercise;Therapeutic activities;Functional mobility training;Stair training;Gait training;Ultrasound;Neuromuscular re-education;Patient/family education;Manual techniques;Taping;Energy conservation;Dry needling;Passive range of motion    PT Next Visit Plan  Monitor tolerance to therex; LE flexibility; modalities prn       Patient will benefit from skilled therapeutic intervention in order to improve the following deficits and  impairments:  Decreased activity tolerance, Decreased strength, Pain, Difficulty walking, Decreased range of motion, Improper body mechanics, Postural dysfunction, Impaired flexibility  Visit Diagnosis: 1. Acute right-sided low back pain with right-sided sciatica   2. Difficulty in walking, not elsewhere classified   3. Other symptoms and signs involving the musculoskeletal system        Problem List Patient Active Problem List   Diagnosis Date Noted  . Lumbar radiculopathy 05/09/2019  . Frequent UTI 08/29/2018  . Left foot pain 07/25/2018  . Right ankle pain 04/23/2017  . Pain of right heel 04/23/2017  . Prediabetes 10/07/2016  . Lung nodule, solitary 10/07/2016  . Family history of breast cancer 11/25/2015  . 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 Incomplete RBBB   . GERD (gastroesophageal reflux disease)   . Hypertension   . Hyperlipidemia   . 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[place])   . Migraines   . BARRETTS ESOPHAGUS 09/05/2010  . PERSONAL HISTORY OF COLONIC POLYPS 09/05/2010    Bess Harvest, PTA 05/29/19 5:48 PM   [facility] [address]  [place], [place], [address] Phone: [phone]   Fax:  [phone]  Name: Sarah Valentine MRN: [id number] Date of Birth: August 15, 1951

## 2019-06-01 ENCOUNTER — Encounter: Payer: Self-pay | Admitting: Physical Therapy

## 2019-06-01 ENCOUNTER — Ambulatory Visit: Payer: Medicare Other | Admitting: Physical Therapy

## 2019-06-01 ENCOUNTER — Other Ambulatory Visit: Payer: Self-pay

## 2019-06-01 DIAGNOSIS — R262 Difficulty in walking, not elsewhere classified: Secondary | ICD-10-CM

## 2019-06-01 DIAGNOSIS — R29898 Other symptoms and signs involving the musculoskeletal system: Secondary | ICD-10-CM | POA: Diagnosis not present

## 2019-06-01 DIAGNOSIS — M5441 Lumbago with sciatica, right side: Secondary | ICD-10-CM | POA: Diagnosis not present

## 2019-06-01 NOTE — Therapy (Signed)
[facility] [address]  [place], [place], [address] Phone: [phone]   Fax:  [phone]  Physical Therapy Treatment  Patient Details  Name: Sarah Valentine MRN: [id number] Date of Birth: [birth year] Referring Provider (PT): Billey Gosling, MD   Encounter Date: 06/01/2019  PT End of Session - 06/01/19 1743    Visit Number  7    Number of Visits  13    Date for PT Re-Evaluation  06/21/19    Authorization Type  Medicare & BCBS    PT Start Time  1656    PT Stop Time  1749    PT Time Calculation (min)  53 min    Activity Tolerance  Patient tolerated treatment well    Behavior During Therapy  [facility] for tasks assessed/performed       Past Medical History:  Diagnosis Date  . [place] 2010  . BCC (basal cell carcinoma), trunk 08/2013   removed from back (gould)  . GERD (gastroesophageal reflux disease)   . Hx of adenomatous colonic polyps 12/2008  . Hyperlipidemia   . Hypertension   . 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[place]) 2011   Melanoma in situ (L) buttock, dr Delman Cheadle  . Migraines   . Nephritis, hereditary    familial chronic nephritis  . 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 ABDOMINAL HYSTERECTOMY  1993  . BASAL CELL CARCINOMA EXCISION  08/2013   back  . CHOLECYSTECTOMY  2010  . COLONOSCOPY W/ BIOPSIES AND POLYPECTOMY  12/2008   polyp removed  . MELANOMA EXCISION Left 2011   buttock  . Perinephtric abscess with peritonitis  1980  . SQUAMOUS CELL CARCINOMA EXCISION  01/2014   back    There were no vitals filed for this visit.  Subjective Assessment - 06/01/19 1656    Subjective  Reports that she had some back soreness Tuesday and again today. Denies change in activity.    Pertinent History  nephritis, migraines, melanoma- excised, HTN, HLD, GERD    Diagnostic tests  05/09/19 lumbar xray: Degenerative disc disease  changes of the lumbar spine.    Patient Stated Goals  get the pain gone    Currently in Pain?  Yes    Pain Score  6     Pain Location  Back    Pain Orientation  Right    Pain Descriptors / Indicators  Aching    Pain Type  Acute pain;Chronic pain                       [facility] Adult PT Treatment/Exercise - 06/01/19 0001      Lumbar Exercises: Aerobic   Nustep  L4 x 7 min LEs only      Lumbar Exercises: Machines for Strengthening   Other Lumbar Machine Exercise  straight arm pulldown 10x7lbs   cues to maintain arms straight and tighten core     Lumbar Exercises: Prone   Other Prone Lumbar Exercises  Prone partial press up 3" x 10 reps       Knee/Hip Exercises: Stretches   Other Knee/Hip Stretches  R figure-4 stretch 2x 30 sec with hands clasped behind L LE      Knee/Hip Exercises: Standing   Other Standing Knee Exercises  monster walks with red TB anterior/posterior 2x43ft    Other Standing Knee Exercises  standing around  the worlds with yellow TB x10 each LE at counter      Knee/Hip Exercises: Supine   Bridges  Strengthening;Both;1 set;10 reps    Bridges Limitations  bridge + march   hip drop when on R     Knee/Hip Exercises: Prone   Hip Extension  Strengthening;Right;Left;1 set;10 reps    Hip Extension Limitations  prone donkey kicks      Moist Heat Therapy   Number Minutes Moist Heat  10 Minutes    Moist Heat Location  Lumbar Spine      Electrical Stimulation   Electrical Stimulation Location  R LB    Electrical Stimulation Action  IFC    Electrical Stimulation Parameters  Output: 14 to tol; 10 min    Electrical Stimulation Goals  Pain      Manual Therapy   Manual Therapy  Soft tissue mobilization;Myofascial release    Manual therapy comments  prone    Soft tissue mobilization  STM to R piriformis and lateral glute- minimal tenderness but palpable soft tissue and twitch response    Myofascial Release  manual TPR to R priformis             PT  Education - 06/01/19 1742    Education Details  edu on self-STM using ball on wall for pain relief to R buttock    Person(s) Educated  Patient    Methods  Explanation;Demonstration;Tactile cues;Verbal cues    Comprehension  Verbalized understanding;Returned demonstration       PT Short Term Goals - 05/17/19 1754      PT SHORT TERM GOAL #1   Title  Patient to be independent with initial HEP.    Time  3    Period  Weeks    Status  Achieved    Target Date  05/31/19        PT Long Term Goals - 05/15/19 1329      PT LONG TERM GOAL #1   Title  Patient to be independent with advanced HEP.    Time  6    Period  Weeks    Status  On-going      PT LONG TERM GOAL #2   Title  Patient to demonstrate lumbar AROM WFL and without pain limiting.    Time  6    Period  Weeks    Status  On-going      PT LONG TERM GOAL #3   Title  Patient to demonstrate R HS and piriformis flexibility without restrictions.    Time  6    Period  Weeks    Status  On-going      PT LONG TERM GOAL #4   Title  Patient to report tolerance of 1 hour of sitting without pain limiting.    Time  6    Period  Weeks    Status  On-going      PT LONG TERM GOAL #5   Title  Patient to report 1 week of walking for exercise with <3/10 pain.    Time  6    Period  Weeks    Status  On-going            Plan - 06/01/19 1743    Clinical Impression Statement  Patient reporting increased pain today without known caused. Denies any changes in activity that could have contributed to this. Tolerated STM and manual TPR to R buttock as patient demonstrating increased soft tissue restriction in R piriformis and lateral glute.  Educated patient on self-STM technique with ball on wall for relief at home. Progressed glute strengthening with banded resistance- patient tolerating this well but requiring cues for upright posture. Introduced Clinical cytogeneticist with cues to tighten core and maintain straight elbows with straight  arm pulldown. Patient reports 4/10 pain after ther-ex, compared to 7/10 at beginning of session. Ended session with e-stim and moist heat to LB for pain relief. Patient reporting pain levels down to 2-3/10 upon leaving.    Personal Factors and Comorbidities  Age;Sex;Comorbidity 3+;Fitness;Past/Current Experience;Time since onset of injury/illness/exacerbation    Comorbidities  nephritis, migraines, melanoma- excised, HTN, HLD, GERD    Rehab Potential  Good    PT Treatment/Interventions  ADLs/Self Care Home Management;Cryotherapy;Electrical Stimulation;Moist Heat;Traction;Balance training;Therapeutic exercise;Therapeutic activities;Functional mobility training;Stair training;Gait training;Ultrasound;Neuromuscular re-education;Patient/family education;Manual techniques;Taping;Energy conservation;Dry needling;Passive range of motion    PT Next Visit Plan  Monitor tolerance to therex; LE flexibility; modalities prn    Consulted and Agree with Plan of Care  Patient       Patient will benefit from skilled therapeutic intervention in order to improve the following deficits and impairments:  Decreased activity tolerance, Decreased strength, Pain, Difficulty walking, Decreased range of motion, Improper body mechanics, Postural dysfunction, Impaired flexibility  Visit Diagnosis: 1. Acute right-sided low back pain with right-sided sciatica   2. Difficulty in walking, not elsewhere classified   3. Other symptoms and signs involving the musculoskeletal system        Problem List Patient Active Problem List   Diagnosis Date Noted  . Lumbar radiculopathy 05/09/2019  . Frequent UTI 08/29/2018  . Left foot pain 07/25/2018  . Right ankle pain 04/23/2017  . Pain of right heel 04/23/2017  . Prediabetes 10/07/2016  . Lung nodule, solitary 10/07/2016  . Family history of breast cancer 11/25/2015  . 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 Incomplete RBBB   . GERD (gastroesophageal reflux disease)   .  Hypertension   . Hyperlipidemia   . 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[place])   . Migraines   . BARRETTS ESOPHAGUS 09/05/2010  . PERSONAL HISTORY OF COLONIC POLYPS 09/05/2010    Janene Harvey, PT, DPT 06/01/19 5:52 PM   [facility] [address]  [place], [place], [address] Phone: [phone]   Fax:  [phone]  Name: Sarah Valentine MRN: [id number] Date of Birth: [birth year]

## 2019-06-05 ENCOUNTER — Other Ambulatory Visit: Payer: Self-pay

## 2019-06-05 ENCOUNTER — Ambulatory Visit: Payer: Medicare Other | Attending: Internal Medicine

## 2019-06-05 DIAGNOSIS — M5441 Lumbago with sciatica, right side: Secondary | ICD-10-CM

## 2019-06-05 DIAGNOSIS — R29898 Other symptoms and signs involving the musculoskeletal system: Secondary | ICD-10-CM | POA: Diagnosis not present

## 2019-06-05 DIAGNOSIS — R262 Difficulty in walking, not elsewhere classified: Secondary | ICD-10-CM | POA: Diagnosis not present

## 2019-06-05 NOTE — Therapy (Signed)
[facility] [address]  [place], [place], [address] Phone: [phone]   Fax:  [phone]  Physical Therapy Treatment  Patient Details  Name: Sarah Valentine MRN: [id number] Date of Birth: 1950/12/15 Referring Provider (PT): Billey Gosling, MD   Encounter Date: 06/05/2019  PT End of Session - 06/05/19 1705    Visit Number  8    Number of Visits  13    Date for PT Re-Evaluation  06/21/19    Authorization Type  Medicare & BCBS    PT Start Time  1658    PT Stop Time  1755   ended visit with 10 min moist heat   PT Time Calculation (min)  57 min    Activity Tolerance  Patient tolerated treatment well    Behavior During Therapy  [facility] for tasks assessed/performed       Past Medical History:  Diagnosis Date  . [place] 2010  . BCC (basal cell carcinoma), trunk 08/2013   removed from back (gould)  . GERD (gastroesophageal reflux disease)   . Hx of adenomatous colonic polyps 12/2008  . Hyperlipidemia   . Hypertension   . 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[place]) 2011   Melanoma in situ (L) buttock, dr Delman Cheadle  . Migraines   . Nephritis, hereditary    familial chronic nephritis  . 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 ABDOMINAL HYSTERECTOMY  1993  . BASAL CELL CARCINOMA EXCISION  08/2013   back  . CHOLECYSTECTOMY  2010  . COLONOSCOPY W/ BIOPSIES AND POLYPECTOMY  12/2008   polyp removed  . MELANOMA EXCISION Left 2011   buttock  . Perinephtric abscess with peritonitis  1980  . SQUAMOUS CELL CARCINOMA EXCISION  01/2014   back    There were no vitals filed for this visit.  Subjective Assessment - 06/05/19 1702    Subjective  Pt. reporting her back feels better than it did last visit and feels like she has been able to avoid recent "flare-ups" of pain.    Pertinent History  nephritis, migraines, melanoma- excised, HTN, HLD,  GERD    Diagnostic tests  05/09/19 lumbar xray: Degenerative disc disease changes of the lumbar spine.    Patient Stated Goals  get the pain gone    Currently in Pain?  No/denies    Pain Score  0-No pain    Multiple Pain Sites  No                       [facility] Adult PT Treatment/Exercise - 06/05/19 0001      Lumbar Exercises: Aerobic   Nustep  L5 x 7 min LEs only      Lumbar Exercises: Machines for Strengthening   Other Lumbar Machine Exercise  B machine pallof press 5# x 10 rpes     Other Lumbar Machine Exercise  B cable machine low row 5# single arm both sides x 10 reps in staggered stance       Lumbar Exercises: Standing   Row  Both;15 reps;Strengthening    Theraband Level (Row)  Level 2 (Red)    Shoulder Extension  Both;15 reps    Theraband Level (Shoulder Extension)  Level 2 (Red)      Lumbar Exercises: Prone   Other Prone Lumbar Exercises  Prone partial press up 3" x 10 reps  Lumbar Exercises: Quadruped   Madcat/Old Horse  15 reps    Single Arm Raise  Right;Left;10 reps;3 seconds    Single Arm Raises Limitations  peanut p-ball support    Straight Leg Raise  10 reps    Straight Leg Raises Limitations  peanut p-ball support      Moist Heat Therapy   Number Minutes Moist Heat  10 Minutes    Moist Heat Location  Lumbar Spine               PT Short Term Goals - 05/17/19 1754      PT SHORT TERM GOAL #1   Title  Patient to be independent with initial HEP.    Time  3    Period  Weeks    Status  Achieved    Target Date  05/31/19        PT Long Term Goals - 05/15/19 1329      PT LONG TERM GOAL #1   Title  Patient to be independent with advanced HEP.    Time  6    Period  Weeks    Status  On-going      PT LONG TERM GOAL #2   Title  Patient to demonstrate lumbar AROM WFL and without pain limiting.    Time  6    Period  Weeks    Status  On-going      PT LONG TERM GOAL #3   Title  Patient to demonstrate R HS and piriformis  flexibility without restrictions.    Time  6    Period  Weeks    Status  On-going      PT LONG TERM GOAL #4   Title  Patient to report tolerance of 1 hour of sitting without pain limiting.    Time  6    Period  Weeks    Status  On-going      PT LONG TERM GOAL #5   Title  Patient to report 1 week of walking for exercise with <3/10 pain.    Time  6    Period  Weeks    Status  On-going            Plan - 06/05/19 1725    Clinical Impression Statement  Sarah Valentine doing well today noting decreased pain since last visit.  Notes improvement since starting therapy.  Progressed lumbopelvic strengthening and stability activities without increased LBP today.  Did continues to focus on R buttocks flexibility as pt. still reporting intermittent "tightness"/discomfort in this area.  Ended session with some lower back stiffness thus applied moist heat pack to lumbar spine with good relief.  pt. progressing well.    Personal Factors and Comorbidities  Age;Sex;Comorbidity 3+;Fitness;Past/Current Experience;Time since onset of injury/illness/exacerbation    Comorbidities  nephritis, migraines, melanoma- excised, HTN, HLD, GERD    Rehab Potential  Good    PT Treatment/Interventions  ADLs/Self Care Home Management;Cryotherapy;Electrical Stimulation;Moist Heat;Traction;Balance training;Therapeutic exercise;Therapeutic activities;Functional mobility training;Stair training;Gait training;Ultrasound;Neuromuscular re-education;Patient/family education;Manual techniques;Taping;Energy conservation;Dry needling;Passive range of motion    PT Next Visit Plan  Monitor tolerance to therex; LE flexibility; modalities prn    Consulted and Agree with Plan of Care  Patient       Patient will benefit from skilled therapeutic intervention in order to improve the following deficits and impairments:  Decreased activity tolerance, Decreased strength, Pain, Difficulty walking, Decreased range of motion, Improper body  mechanics, Postural dysfunction, Impaired flexibility  Visit Diagnosis: 1. Acute right-sided low back  pain with right-sided sciatica   2. Difficulty in walking, not elsewhere classified   3. Other symptoms and signs involving the musculoskeletal system        Problem List Patient Active Problem List   Diagnosis Date Noted  . Lumbar radiculopathy 05/09/2019  . Frequent UTI 08/29/2018  . Left foot pain 07/25/2018  . Right ankle pain 04/23/2017  . Pain of right heel 04/23/2017  . Prediabetes 10/07/2016  . Lung nodule, solitary 10/07/2016  . Family history of breast cancer 11/25/2015  . 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 Incomplete RBBB   . GERD (gastroesophageal reflux disease)   . Hypertension   . Hyperlipidemia   . 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[place])   . Migraines   . BARRETTS ESOPHAGUS 09/05/2010  . PERSONAL HISTORY OF COLONIC POLYPS 09/05/2010    Bess Harvest, PTA 06/05/19 5:57 PM   [facility] [address]  [place], [place], [address] Phone: [phone]   Fax:  [phone]  Name: Sarah Valentine MRN: [id number] Date of Birth: [birth year]

## 2019-06-08 ENCOUNTER — Ambulatory Visit: Payer: Medicare Other | Admitting: Physical Therapy

## 2019-06-08 ENCOUNTER — Other Ambulatory Visit: Payer: Self-pay

## 2019-06-08 ENCOUNTER — Encounter: Payer: Self-pay | Admitting: Physical Therapy

## 2019-06-08 DIAGNOSIS — M5441 Lumbago with sciatica, right side: Secondary | ICD-10-CM | POA: Diagnosis not present

## 2019-06-08 DIAGNOSIS — R262 Difficulty in walking, not elsewhere classified: Secondary | ICD-10-CM

## 2019-06-08 DIAGNOSIS — R29898 Other symptoms and signs involving the musculoskeletal system: Secondary | ICD-10-CM | POA: Diagnosis not present

## 2019-06-08 NOTE — Therapy (Signed)
[facility] [address]  [place], [place], [address] Phone: [phone]   Fax:  [phone]  Physical Therapy Treatment  Patient Details  Name: Sarah Valentine MRN: [id number] Date of Birth: Feb 05, 1951 Referring Provider (PT): Billey Gosling, MD    Encounter Date: 06/08/2019  PT End of Session - 06/08/19 1744    Visit Number  9    Number of Visits  13    Date for PT Re-Evaluation  06/21/19    Authorization Type  Medicare & BCBS    PT Start Time  1700    PT Stop Time  1743    PT Time Calculation (min)  43 min    Activity Tolerance  Patient tolerated treatment well    Behavior During Therapy  [facility] for tasks assessed/performed       Past Medical History:  Diagnosis Date  . [place] 2010  . BCC (basal cell carcinoma), trunk 08/2013   removed from back (gould)  . GERD (gastroesophageal reflux disease)   . Hx of adenomatous colonic polyps 12/2008  . Hyperlipidemia   . Hypertension   . 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[place]) 2011   Melanoma in situ (L) buttock, dr Delman Cheadle  . Migraines   . Nephritis, hereditary    familial chronic nephritis  . 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 ABDOMINAL HYSTERECTOMY  1993  . BASAL CELL CARCINOMA EXCISION  08/2013   back  . CHOLECYSTECTOMY  2010  . COLONOSCOPY W/ BIOPSIES AND POLYPECTOMY  12/2008   polyp removed  . MELANOMA EXCISION Left 2011   buttock  . Perinephtric abscess with peritonitis  1980  . SQUAMOUS CELL CARCINOMA EXCISION  01/2014   back    There were no vitals filed for this visit.  Subjective Assessment - 06/08/19 1706    Subjective  Reports that she was feeling really good on Tuesday and Wednesday. Had a little bit of pain today.    Pertinent History  nephritis, migraines, melanoma- excised, HTN, HLD, GERD    Diagnostic tests  05/09/19 lumbar xray: Degenerative disc  disease changes of the lumbar spine.    Patient Stated Goals  get the pain gone    Currently in Pain?  Yes    Pain Score  4     Pain Location  Back    Pain Orientation  Right    Pain Descriptors / Indicators  Aching    Pain Type  Acute pain;Chronic pain                       [facility] Adult PT Treatment/Exercise - 06/08/19 0001      Lumbar Exercises: Stretches   Passive Hamstring Stretch  Right;Left;1 rep;30 seconds    Passive Hamstring Stretch Limitations  sitting with stool and neutral spine      Lumbar Exercises: Aerobic   Nustep  L5 x 1.5 min LEs only   cut short per patient's request     Lumbar Exercises: Standing   Other Standing Lumbar Exercises  paloff press with red TB x10 each side   cues for core contraction   Other Standing Lumbar Exercises  standing lumbar extension x5      Lumbar Exercises: Supine   Ab Set  5 reps    AB Set Limitations  5x10" TrA contraction    Other Supine Lumbar  Exercises  TrA + alt toe tap x10      Knee/Hip Exercises: Stretches   Piriformis Stretch  Right;1 rep;30 seconds    Piriformis Stretch Limitations  KTOS to tolerance seated     Other Knee/Hip Stretches  R & L figure-4 stretch 30 sec with hands clasped behind LE      Knee/Hip Exercises: Standing   Wall Squat  10 reps;2 sets    Wall Squat Limitations  2nd set with red TB above knees   to tolerable depth   Other Standing Knee Exercises  resisted trunk rotation with red TB x10 each side   cues to avoid shoulder hike            PT Education - 06/08/19 1743    Education Details  update to HEP; edu on use of standing lumbar extensions to relieve back pain during the day    Person(s) Educated  Patient    Methods  Explanation;Demonstration;Tactile cues;Verbal cues;Handout    Comprehension  Verbalized understanding;Returned demonstration       PT Short Term Goals - 05/17/19 1754      PT SHORT TERM GOAL #1   Title  Patient to be independent with initial HEP.     Time  3    Period  Weeks    Status  Achieved    Target Date  05/31/19        PT Long Term Goals - 05/15/19 1329      PT LONG TERM GOAL #1   Title  Patient to be independent with advanced HEP.    Time  6    Period  Weeks    Status  On-going      PT LONG TERM GOAL #2   Title  Patient to demonstrate lumbar AROM WFL and without pain limiting.    Time  6    Period  Weeks    Status  On-going      PT LONG TERM GOAL #3   Title  Patient to demonstrate R HS and piriformis flexibility without restrictions.    Time  6    Period  Weeks    Status  On-going      PT LONG TERM GOAL #4   Title  Patient to report tolerance of 1 hour of sitting without pain limiting.    Time  6    Period  Weeks    Status  On-going      PT LONG TERM GOAL #5   Title  Patient to report 1 week of walking for exercise with <3/10 pain.    Time  6    Period  Weeks    Status  On-going            Plan - 06/08/19 1747    Clinical Impression Statement  Patient reporting no pain since last session until today while performing HEP. Reporting that she believes that supine HS stretch and supine figure 4 stretch are aggravating her pain. Worked on modifying these movements to encourage neutral spine positioning with these same stretches in sitting, which was better tolerated. Also educated patient on the use of standing lumbar extensions to relieve back pain during the day. Patient reported understanding. Worked on core stability exercises and instructed patient on TrA activation which patient responded to well. Ended session with no increase in pain.    Personal Factors and Comorbidities  Age;Sex;Comorbidity 3+;Fitness;Past/Current Experience;Time since onset of injury/illness/exacerbation    Comorbidities  nephritis, migraines, melanoma- excised, HTN, HLD,  GERD    Rehab Potential  Good    PT Treatment/Interventions  ADLs/Self Care Home Management;Cryotherapy;Electrical Stimulation;Moist Heat;Traction;Balance  training;Therapeutic exercise;Therapeutic activities;Functional mobility training;Stair training;Gait training;Ultrasound;Neuromuscular re-education;Patient/family education;Manual techniques;Taping;Energy conservation;Dry needling;Passive range of motion    PT Next Visit Plan  Monitor tolerance to therex; LE flexibility; modalities prn    Consulted and Agree with Plan of Care  Patient       Patient will benefit from skilled therapeutic intervention in order to improve the following deficits and impairments:  Decreased activity tolerance, Decreased strength, Pain, Difficulty walking, Decreased range of motion, Improper body mechanics, Postural dysfunction, Impaired flexibility  Visit Diagnosis: 1. Acute right-sided low back pain with right-sided sciatica   2. Difficulty in walking, not elsewhere classified   3. Other symptoms and signs involving the musculoskeletal system        Problem List Patient Active Problem List   Diagnosis Date Noted  . Lumbar radiculopathy 05/09/2019  . Frequent UTI 08/29/2018  . Left foot pain 07/25/2018  . Right ankle pain 04/23/2017  . Pain of right heel 04/23/2017  . Prediabetes 10/07/2016  . Lung nodule, solitary 10/07/2016  . Family history of breast cancer 11/25/2015  . 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 Incomplete RBBB   . GERD (gastroesophageal reflux disease)   . Hypertension   . Hyperlipidemia   . 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[place])   . Migraines   . BARRETTS ESOPHAGUS 09/05/2010  . PERSONAL HISTORY OF COLONIC POLYPS 09/05/2010    Janene Harvey, PT, DPT 06/08/19 5:51 PM   [facility] [address]  [place], [place], [address] Phone: [phone]   Fax:  [phone]  Name: Sarah Valentine MRN: [id number] Date of Birth: February 04, 1951

## 2019-06-12 ENCOUNTER — Ambulatory Visit: Payer: Medicare Other | Admitting: Physical Therapy

## 2019-06-12 ENCOUNTER — Encounter: Payer: Self-pay | Admitting: Physical Therapy

## 2019-06-12 ENCOUNTER — Other Ambulatory Visit: Payer: Self-pay

## 2019-06-12 DIAGNOSIS — M5441 Lumbago with sciatica, right side: Secondary | ICD-10-CM | POA: Diagnosis not present

## 2019-06-12 DIAGNOSIS — R262 Difficulty in walking, not elsewhere classified: Secondary | ICD-10-CM

## 2019-06-12 DIAGNOSIS — R29898 Other symptoms and signs involving the musculoskeletal system: Secondary | ICD-10-CM

## 2019-06-12 NOTE — Therapy (Signed)
[facility] [address]  [place], [place], [address] Phone: [phone]   Fax:  [phone]  Physical Therapy Progress Note  Patient Details  Name: Sarah Valentine MRN: [id number] Date of Birth: [birth year] Referring Provider (PT): Billey Gosling, MD   Progress Note Reporting Period 05/10/19 to 06/12/19  See note below for Objective Data and Assessment of Progress/Goals.     Encounter Date: 06/12/2019  PT End of Session - 06/12/19 1744    Visit Number  10    Number of Visits  13    Date for PT Re-Evaluation  06/21/19    Authorization Type  Medicare & BCBS    PT Start Time  1703    PT Stop Time  1741    PT Time Calculation (min)  38 min    Activity Tolerance  Patient tolerated treatment well    Behavior During Therapy  WFL for tasks assessed/performed       Past Medical History:  Diagnosis Date  . [place] 2010  . BCC (basal cell carcinoma), trunk 08/2013   removed from back (gould)  . GERD (gastroesophageal reflux disease)   . Hx of adenomatous colonic polyps 12/2008  . Hyperlipidemia   . Hypertension   . 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[place]) 2011   Melanoma in situ (L) buttock, dr Delman Cheadle  . Migraines   . Nephritis, hereditary    familial chronic nephritis  . 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 ABDOMINAL HYSTERECTOMY  1993  . BASAL CELL CARCINOMA EXCISION  08/2013   back  . CHOLECYSTECTOMY  2010  . COLONOSCOPY W/ BIOPSIES AND POLYPECTOMY  12/2008   polyp removed  . MELANOMA EXCISION Left 2011   buttock  . Perinephtric abscess with peritonitis  1980  . SQUAMOUS CELL CARCINOMA EXCISION  01/2014   back    There were no vitals filed for this visit.  Subjective Assessment - 06/12/19 1707    Subjective  Reports that she is feeling about the same since last session. Has stopped walking to see if it would  change anything.No longer having pain after HEP. Reports 50% improvement since inital eval. Feels like prolonged sitting still bothers her.    Pertinent History  nephritis, migraines, melanoma- excised, HTN, HLD, GERD    Diagnostic tests  05/09/19 lumbar xray: Degenerative disc disease changes of the lumbar spine.    Patient Stated Goals  get the pain gone    Currently in Pain?  Yes    Pain Score  4     Pain Location  Back    Pain Orientation  Right    Pain Type  Acute pain;Chronic pain         [facility] PT Assessment - 06/12/19 0001      AROM   Lumbar Flexion  toes    Lumbar Extension  WFL    Lumbar - Right Side Bend  jt line    Lumbar - Left Side Bend  proximal shin    Lumbar - Right Rotation  WFL    Lumbar - Left Rotation  WFL      Flexibility   Hamstrings  R WFL    Piriformis  R WFL                   [facility] Adult PT Treatment/Exercise - 06/12/19 0001  Lumbar Exercises: Aerobic   Nustep  L4 x 6 min LEs only      Lumbar Exercises: Prone   Other Prone Lumbar Exercises  Prone partial press up 3" x 10 reps       Lumbar Exercises: Quadruped   Madcat/Old Horse  10 reps    Madcat/Old Horse Limitations  cues for slow and controlled movement    Other Quadruped Lumbar Exercises  child's pose R & L stretch 5x10" to tolerance    Other Quadruped Lumbar Exercises  child's pose 5x10"   cues to sink deeper into stretch     Knee/Hip Exercises: Standing   Hip Extension  Stengthening;Right;Left;1 set;10 reps;Knee straight    Extension Limitations  at counter top with yellow TB   cues for upright trunk     Knee/Hip Exercises: Prone   Hip Extension  Strengthening;Right;Left;1 set;10 reps    Hip Extension Limitations  prone donkey kicks   manual assistance to avoid trunk rotation            PT Education - 06/12/19 1744    Education Details  update to HEP; discussion on objective progress with PT    Person(s) Educated  Patient    Methods   Explanation;Demonstration;Tactile cues;Verbal cues;Handout    Comprehension  Verbalized understanding;Returned demonstration       PT Short Term Goals - 06/12/19 1711      PT SHORT TERM GOAL #1   Title  Patient to be independent with initial HEP.    Time  3    Period  Weeks    Status  Achieved    Target Date  05/31/19        PT Long Term Goals - 06/12/19 1711      PT LONG TERM GOAL #1   Title  Patient to be independent with advanced HEP.    Time  6    Period  Weeks    Status  Partially Met   met for current     PT LONG TERM GOAL #2   Title  Patient to demonstrate lumbar AROM WFL and without pain limiting.    Time  6    Period  Weeks    Status  Partially Met   AROM improved in extension, L sidebending, and in pain levels with flexion     PT LONG TERM GOAL #3   Title  Patient to demonstrate R HS and piriformis flexibility without restrictions.    Time  6    Period  Weeks    Status  Achieved   WFL     PT LONG TERM GOAL #4   Title  Patient to report tolerance of 1 hour of sitting without pain limiting.    Time  6    Period  Weeks    Status  On-going   10 min at this time     PT LONG TERM GOAL #5   Title  Patient to report 1 week of walking for exercise with <3/10 pain.    Time  6    Period  Weeks    Status  Achieved   reports that pain levels do not increase with walking           Plan - 06/12/19 1745    Clinical Impression Statement  Patient arrived to session with report of 50% improvement in LB since initial eval. Still reports that prolonged sitting is one of her major aggravating factors but has taken measures to improve her tolerance.  At this time, patient reports that she is still limited to 10 min of sitting. However, now is able to walk up to 2 miles without increase in pain. Lumbar AROM improved in extension, L sidebending, and in pain levels with flexion. Patient has also improved R hamstring and piriformis flexibility, which now are [facility]. Patient  tolerated all lumbopelvic ROM and glute strengthening well today. Updated HEP to include standing hip extension. Patient reported understanding and declined modalities at end of session. Patient is showing good progress, will continue to benefit from skilled PT services to address remaining goals.    Comorbidities  nephritis, migraines, melanoma- excised, HTN, HLD, GERD    Rehab Potential  Good    PT Treatment/Interventions  ADLs/Self Care Home Management;Cryotherapy;Electrical Stimulation;Moist Heat;Traction;Balance training;Therapeutic exercise;Therapeutic activities;Functional mobility training;Stair training;Gait training;Ultrasound;Neuromuscular re-education;Patient/family education;Manual techniques;Taping;Energy conservation;Dry needling;Passive range of motion    PT Next Visit Plan  progress hip strengthening and lumbar extension exercises; modalities prn    Consulted and Agree with Plan of Care  Patient       Patient will benefit from skilled therapeutic intervention in order to improve the following deficits and impairments:  Decreased activity tolerance, Decreased strength, Pain, Difficulty walking, Decreased range of motion, Improper body mechanics, Postural dysfunction, Impaired flexibility  Visit Diagnosis: 1. Acute right-sided low back pain with right-sided sciatica   2. Difficulty in walking, not elsewhere classified   3. Other symptoms and signs involving the musculoskeletal system        Problem List Patient Active Problem List   Diagnosis Date Noted  . Lumbar radiculopathy 05/09/2019  . Frequent UTI 08/29/2018  . Left foot pain 07/25/2018  . Right ankle pain 04/23/2017  . Pain of right heel 04/23/2017  . Prediabetes 10/07/2016  . Lung nodule, solitary 10/07/2016  . Family history of breast cancer 11/25/2015  . 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 Incomplete RBBB   . GERD (gastroesophageal reflux disease)   . Hypertension   . Hyperlipidemia   . Melanoma of buttock  ([place])   . Migraines   . BARRETTS ESOPHAGUS 09/05/2010  . PERSONAL HISTORY OF COLONIC POLYPS 09/05/2010    Janene Harvey, PT, DPT 06/12/19 5:50 PM   [facility] [address]  [place], [place], [address] Phone: [phone]   Fax:  [phone]  Name: Joelene Barriere MRN: [id number] Date of Birth: [birth year]

## 2019-06-15 ENCOUNTER — Other Ambulatory Visit: Payer: Self-pay

## 2019-06-15 ENCOUNTER — Encounter: Payer: Self-pay | Admitting: Physical Therapy

## 2019-06-15 ENCOUNTER — Ambulatory Visit: Payer: Medicare Other | Admitting: Physical Therapy

## 2019-06-15 DIAGNOSIS — M5441 Lumbago with sciatica, right side: Secondary | ICD-10-CM

## 2019-06-15 DIAGNOSIS — R262 Difficulty in walking, not elsewhere classified: Secondary | ICD-10-CM | POA: Diagnosis not present

## 2019-06-15 DIAGNOSIS — R29898 Other symptoms and signs involving the musculoskeletal system: Secondary | ICD-10-CM | POA: Diagnosis not present

## 2019-06-15 NOTE — Therapy (Signed)
[facility] [address]  [place], [place], [address] Phone: [phone]   Fax:  [phone]  Physical Therapy Treatment  Patient Details  Name: Sarah Valentine MRN: [id number] Date of Birth: Mar 17, 1951 Referring Provider (PT): Billey Gosling, MD   Encounter Date: 06/15/2019  PT End of Session - 06/15/19 1746    Visit Number  11    Number of Visits  13    Date for PT Re-Evaluation  06/21/19    Authorization Type  Medicare & BCBS    PT Start Time  1656    PT Stop Time  1745    PT Time Calculation (min)  49 min    Activity Tolerance  Patient tolerated treatment well    Behavior During Therapy  [facility] for tasks assessed/performed       Past Medical History:  Diagnosis Date  . [place] 2010  . BCC (basal cell carcinoma), trunk 08/2013   removed from back ([facility])  . GERD (gastroesophageal reflux disease)   . Hx of adenomatous colonic polyps 12/2008  . Hyperlipidemia   . Hypertension   . 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[place]) 2011   Melanoma in situ (L) buttock, dr Delman Cheadle  . Migraines   . Nephritis, hereditary    familial chronic nephritis  . 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 ABDOMINAL HYSTERECTOMY  1993  . BASAL CELL CARCINOMA EXCISION  08/2013   back  . CHOLECYSTECTOMY  2010  . COLONOSCOPY W/ BIOPSIES AND POLYPECTOMY  12/2008   polyp removed  . MELANOMA EXCISION Left 2011   buttock  . Perinephtric abscess with peritonitis  1980  . SQUAMOUS CELL CARCINOMA EXCISION  01/2014   back    There were no vitals filed for this visit.  Subjective Assessment - 06/15/19 1654    Subjective  Reports that this AM she started to feel peripheralization down her R leg after performing sitting HS stretch. Has noticed some tightness in her buttock.    Pertinent History  nephritis, migraines, melanoma- excised, HTN, HLD, GERD    Diagnostic tests  05/09/19 lumbar xray: Degenerative disc disease changes of the lumbar spine.    Patient Stated Goals  get the pain gone    Currently in Pain?  Yes    Pain Score  4     Pain Location  Back    Pain Orientation  Right    Pain Descriptors / Indicators  Aching    Pain Type  Acute pain;Chronic pain         [facility] PT Assessment - 06/15/19 0001      Observation/Other Assessments   Focus on Therapeutic Outcomes (FOTO)   Lumbar: 54 (46% limited) 37% predicted                   [facility] Adult PT Treatment/Exercise - 06/15/19 0001      Lumbar Exercises: Stretches   Passive Hamstring Stretch  Right;Left;1 rep;30 seconds    Passive Hamstring Stretch Limitations  sitting with stool and neutral spine      Lumbar Exercises: Aerobic   Nustep  L4 x 6 min LEs only      Lumbar Exercises: Machines for Strengthening   Other Lumbar Machine Exercise  R & L paloff press 10x each with 5#   cues for decreased UE excursion d/t difficulty   Other Lumbar Machine  Exercise  straight arm pulldown 10x 7#   cues for straight elbows and tight core     Lumbar Exercises: Standing   Other Standing Lumbar Exercises  standing lumbar extension over edge of mat table x10 to tolerance      Lumbar Exercises: Seated   Other Seated Lumbar Exercises  sitting sciatic nerve floss 10x on each LE      Lumbar Exercises: Prone   Other Prone Lumbar Exercises  prone on elbows x10      Lumbar Exercises: Quadruped   Other Quadruped Lumbar Exercises  child's pose 5x10"      Knee/Hip Exercises: Machines for Strengthening   Cybex Leg Press  B LEs 10x 15#   cues to contract core     Manual Therapy   Manual Therapy  Soft tissue mobilization;Myofascial release    Manual therapy comments  prone    Soft tissue mobilization  STM to R piriformis and lateral glute- tenderness and soft tissue restriction, mostly in piriformis     Myofascial Release  manual TPR to R priformis             PT  Education - 06/15/19 1746    Education Details  advised to discontinue sitting HS stretch    Person(s) Educated  Patient    Methods  Explanation;Demonstration    Comprehension  Verbalized understanding;Returned demonstration       PT Short Term Goals - 06/12/19 1711      PT SHORT TERM GOAL #1   Title  Patient to be independent with initial HEP.    Time  3    Period  Weeks    Status  Achieved    Target Date  05/31/19        PT Long Term Goals - 06/12/19 1711      PT LONG TERM GOAL #1   Title  Patient to be independent with advanced HEP.    Time  6    Period  Weeks    Status  Partially Met   met for current     PT LONG TERM GOAL #2   Title  Patient to demonstrate lumbar AROM WFL and without pain limiting.    Time  6    Period  Weeks    Status  Partially Met   AROM improved in extension, L sidebending, and in pain levels with flexion     PT LONG TERM GOAL #3   Title  Patient to demonstrate R HS and piriformis flexibility without restrictions.    Time  6    Period  Weeks    Status  Achieved   WFL     PT LONG TERM GOAL #4   Title  Patient to report tolerance of 1 hour of sitting without pain limiting.    Time  6    Period  Weeks    Status  On-going   10 min at this time     PT LONG TERM GOAL #5   Title  Patient to report 1 week of walking for exercise with <3/10 pain.    Time  6    Period  Weeks    Status  Achieved   reports that pain levels do not increase with walking           Plan - 06/15/19 1747    Clinical Impression Statement  Patient arrived to session with report of peripheralization of back pain down to R lateral ankle after performing sitting HS stretch this  AM. Reviewed this exercise with patient with cues to maintain spine in neutral, however patient again with return of peripheralizing symptoms. Advised patient to discontinue this exercise at home for hopeful prevention of pain. Worked on lumbar extension in standing, hinging at mat, with  patient tolerating this well but without improvement in peripheralization. Tolerated machine strengthening well and with reminders for core contraction. Ended session with STM and manual TPR to R piriformis and lateral glute with most tenderness in piriformis. Patient reported improvement in peripheralizing symptoms. Symptom improvement likely d/t prone positioning as well as manual therapy.    Comorbidities  nephritis, migraines, melanoma- excised, HTN, HLD, GERD    Rehab Potential  Good    PT Treatment/Interventions  ADLs/Self Care Home Management;Cryotherapy;Electrical Stimulation;Moist Heat;Traction;Balance training;Therapeutic exercise;Therapeutic activities;Functional mobility training;Stair training;Gait training;Ultrasound;Neuromuscular re-education;Patient/family education;Manual techniques;Taping;Energy conservation;Dry needling;Passive range of motion    PT Next Visit Plan  progress hip strengthening and lumbar extension exercises; modalities prn    Consulted and Agree with Plan of Care  Patient       Patient will benefit from skilled therapeutic intervention in order to improve the following deficits and impairments:  Decreased activity tolerance, Decreased strength, Pain, Difficulty walking, Decreased range of motion, Improper body mechanics, Postural dysfunction, Impaired flexibility  Visit Diagnosis: 1. Acute right-sided low back pain with right-sided sciatica   2. Difficulty in walking, not elsewhere classified   3. Other symptoms and signs involving the musculoskeletal system        Problem List Patient Active Problem List   Diagnosis Date Noted  . Lumbar radiculopathy 05/09/2019  . Frequent UTI 08/29/2018  . Left foot pain 07/25/2018  . Right ankle pain 04/23/2017  . Pain of right heel 04/23/2017  . Prediabetes 10/07/2016  . Lung nodule, solitary 10/07/2016  . Family history of breast cancer 11/25/2015  . 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 Incomplete RBBB   . GERD  (gastroesophageal reflux disease)   . Hypertension   . Hyperlipidemia   . 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[place])   . Migraines   . BARRETTS ESOPHAGUS 09/05/2010  . PERSONAL HISTORY OF COLONIC POLYPS 09/05/2010    Janene Harvey, PT, DPT 06/15/19 5:52 PM   [facility] [address]  [place], [place], [address] Phone: [phone]   Fax:  [phone]  Name: Natonya Finstad MRN: [id number] Date of Birth: 1951-10-14

## 2019-06-19 ENCOUNTER — Other Ambulatory Visit: Payer: Self-pay

## 2019-06-19 ENCOUNTER — Ambulatory Visit: Payer: Medicare Other

## 2019-06-19 DIAGNOSIS — R29898 Other symptoms and signs involving the musculoskeletal system: Secondary | ICD-10-CM | POA: Diagnosis not present

## 2019-06-19 DIAGNOSIS — R262 Difficulty in walking, not elsewhere classified: Secondary | ICD-10-CM

## 2019-06-19 DIAGNOSIS — M5441 Lumbago with sciatica, right side: Secondary | ICD-10-CM | POA: Diagnosis not present

## 2019-06-19 NOTE — Therapy (Addendum)
[facility] [address]  [place], [place], [address] Phone: [phone]   Fax:  [phone]  Physical Therapy Treatment  Patient Details  Name: Sarah Valentine MRN: [id number] Date of Birth: Dec 01, 1950 Referring Provider (PT): Billey Gosling, MD   Progress Note Reporting Period 06/15/19 to 06/19/19  See note below for Objective Data and Assessment of Progress/Goals.     Encounter Date: 06/19/2019  PT End of Session - 06/19/19 1704    Visit Number  12    Number of Visits  13    Date for PT Re-Evaluation  06/21/19    Authorization Type  Medicare & BCBS    PT Start Time  1700    PT Stop Time  1740    PT Time Calculation (min)  40 min    Activity Tolerance  Patient tolerated treatment well    Behavior During Therapy  WFL for tasks assessed/performed       Past Medical History:  Diagnosis Date  . [place] 2010  . BCC (basal cell carcinoma), trunk 08/2013   removed from back (gould)  . GERD (gastroesophageal reflux disease)   . Hx of adenomatous colonic polyps 12/2008  . Hyperlipidemia   . Hypertension   . 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[place]) 2011   Melanoma in situ (L) buttock, dr Delman Cheadle  . Migraines   . Nephritis, hereditary    familial chronic nephritis  . 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 ABDOMINAL HYSTERECTOMY  1993  . BASAL CELL CARCINOMA EXCISION  08/2013   back  . CHOLECYSTECTOMY  2010  . COLONOSCOPY W/ BIOPSIES AND POLYPECTOMY  12/2008   polyp removed  . MELANOMA EXCISION Left 2011   buttock  . Perinephtric abscess with peritonitis  1980  . SQUAMOUS CELL CARCINOMA EXCISION  01/2014   back    There were no vitals filed for this visit.  Subjective Assessment - 06/19/19 1713    Subjective  Pt. reporting she feels she has hit a plateau with therapy and wishes to wait for more therapy until after  hearing from the Neuro surgeon on Thursday.    Pertinent History  nephritis, migraines, melanoma- excised, HTN, HLD, GERD    Diagnostic tests  05/09/19 lumbar xray: Degenerative disc disease changes of the lumbar spine.    Patient Stated Goals  get the pain gone    Currently in Pain?  Yes    Pain Score  4     Pain Location  Back    Pain Orientation  Right;Medial    Pain Descriptors / Indicators  Aching    Pain Type  Acute pain;Chronic pain    Multiple Pain Sites  No         [facility] PT Assessment - 06/19/19 0001      AROM   AROM Assessment Site  Lumbar    Lumbar Flexion  mid shins     Lumbar Extension  25% limited     Lumbar - Right Side Bend  jt line    Lumbar - Left Side Bend  proximal shin    Lumbar - Right Rotation  WFL    Lumbar - Left Rotation  [facility]      Strength   Strength Assessment Site  Hip;Knee;Ankle    Right/Left Hip  Right;Left    Right Hip Flexion  4+/5    Right  Hip Extension  4/5    Right Hip External Rotation   4+/5    Right Hip Internal Rotation  4+/5    Right Hip ABduction  4+/5    Right Hip ADduction  4+/5    Left Hip Flexion  4+/5    Left Hip Extension  4/5    Left Hip External Rotation  4+/5    Left Hip Internal Rotation  4+/5    Left Hip ABduction  4+/5    Left Hip ADduction  4+/5    Right/Left Knee  Right;Left    Right Knee Flexion  4+/5    Right Knee Extension  5/5    Left Knee Flexion  4+/5    Left Knee Extension  5/5    Right/Left Ankle  Right;Left    Right Ankle Dorsiflexion  4/5    Right Ankle Plantar Flexion  5/5    Left Ankle Dorsiflexion  4+/5    Left Ankle Plantar Flexion  5/5                   [facility] Adult PT Treatment/Exercise - 06/19/19 0001      Self-Care   Self-Care  Other Self-Care Comments    Other Self-Care Comments   REview of HEP with pt. reporting she is comfortable with current exercises and does not have questions      Lumbar Exercises: Stretches   Passive Hamstring Stretch  Right;Left;1 rep;30 seconds     Passive Hamstring Stretch Limitations  seated       Lumbar Exercises: Aerobic   Nustep  L4 x 6 min LEs only      Lumbar Exercises: Standing   Heel Raises  10 reps;3 seconds    Heel Raises Limitations  counter      Lumbar Exercises: Sidelying   Other Sidelying Lumbar Exercises  open book stretch x 10 each side               PT Short Term Goals - 06/12/19 1711      PT SHORT TERM GOAL #1   Title  Patient to be independent with initial HEP.    Time  3    Period  Weeks    Status  Achieved    Target Date  05/31/19        PT Long Term Goals - 06/19/19 1711      PT LONG TERM GOAL #1   Title  Patient to be independent with advanced HEP.    Time  6    Period  Weeks    Status  Achieved      PT LONG TERM GOAL #2   Title  Patient to demonstrate lumbar AROM WFL and without pain limiting.    Time  6    Period  Weeks    Status  Partially Met   06/19/19: still somewhat limited in lumbar AROM flexion, extension     PT LONG TERM GOAL #3   Title  Patient to demonstrate R HS and piriformis flexibility without restrictions.    Time  6    Period  Weeks    Status  Achieved   WFL     PT LONG TERM GOAL #4   Title  Patient to report tolerance of 1 hour of sitting without pain limiting.    Time  6    Period  Weeks    Status  On-going   06/19/19:  10-15 min at this time     PT LONG  TERM GOAL #5   Title  Patient to report 1 week of walking for exercise with <3/10 pain.    Time  6    Period  Weeks    Status  Achieved   06/19/19: reports that pain levels do not increase with walking           Plan - 06/19/19 1710    Clinical Impression Statement  Sarah Valentine with plans to make today her last day of therapy as she notes she has had limited recent progress and feels she is hitting a plateau.  Sees neurosurgeon on Thursday and wishes to wait to schedule more therapy till after this visit.  Supervising PT approving this plan.  Pt. feels she is independent with HEP and tolerating  all activities well at this point achieving LTG #1.  Pt. still partially achieved LTG #2 demonstrating B lumbar AROM rotation, side bending WFL however somewhat limited in AROM flexion and extension.  LTG #3 achieved with pt. demonstrating improved R HS and piriformis flexibility without restrictions.  LTG #4 ongoing at this point with pt. only able to tolerated prolonged sitting for 10-15 min before needing to change positions due to back pain.  Does not feel prolonged walking significantly contributes to LBP thus LTG #5 met.  Pt. opting for 30-dah hold from therapy with supervising PT approving this plan.  Pt. now on 30-day hold from therapy.    Personal Factors and Comorbidities  Age;Sex;Comorbidity 3+;Fitness;Past/Current Experience;Time since onset of injury/illness/exacerbation    Comorbidities  nephritis, migraines, melanoma- excised, HTN, HLD, GERD    Rehab Potential  Good    PT Treatment/Interventions  ADLs/Self Care Home Management;Cryotherapy;Electrical Stimulation;Moist Heat;Traction;Balance training;Therapeutic exercise;Therapeutic activities;Functional mobility training;Stair training;Gait training;Ultrasound;Neuromuscular re-education;Patient/family education;Manual techniques;Taping;Energy conservation;Dry needling;Passive range of motion    PT Next Visit Plan  30-day hold    Consulted and Agree with Plan of Care  Patient       Patient will benefit from skilled therapeutic intervention in order to improve the following deficits and impairments:  Decreased activity tolerance, Decreased strength, Pain, Difficulty walking, Decreased range of motion, Improper body mechanics, Postural dysfunction, Impaired flexibility  Visit Diagnosis: 1. Acute right-sided low back pain with right-sided sciatica   2. Difficulty in walking, not elsewhere classified   3. Other symptoms and signs involving the musculoskeletal system        Problem List Patient Active Problem List   Diagnosis Date Noted   . Lumbar radiculopathy 05/09/2019  . Frequent UTI 08/29/2018  . Left foot pain 07/25/2018  . Right ankle pain 04/23/2017  . Pain of right heel 04/23/2017  . Prediabetes 10/07/2016  . Lung nodule, solitary 10/07/2016  . Family history of breast cancer 11/25/2015  . 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 Incomplete RBBB   . GERD (gastroesophageal reflux disease)   . Hypertension   . Hyperlipidemia   . 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[place])   . Migraines   . BARRETTS ESOPHAGUS 09/05/2010  . PERSONAL HISTORY OF COLONIC POLYPS 09/05/2010    Bess Harvest, PTA 06/19/19 6:01 PM   [facility] [address]  [place], [place], [address] Phone: [phone]   Fax:  [phone]  Name: Sarah Valentine MRN: [id number] Date of Birth: Sep 08, 1951   PHYSICAL THERAPY DISCHARGE SUMMARY  Visits from Start of Care: 13  Current functional level related to goals / functional outcomes: See above clinical impression   Remaining deficits: Decreased lumbar AROM and sitting tolerance   Education /  Equipment: HEP  Plan: Patient agrees to discharge.  Patient goals were partially met. Patient is being discharged due to meeting the stated rehab goals.  ?????     Janene Harvey, PT, DPT 07/24/19 9:15 AM

## 2019-06-21 ENCOUNTER — Ambulatory Visit: Payer: Medicare Other

## 2019-06-21 DIAGNOSIS — N184 Chronic kidney disease, stage 4 (severe): Secondary | ICD-10-CM | POA: Diagnosis not present

## 2019-06-22 ENCOUNTER — Encounter: Payer: Medicare Other | Admitting: Physical Therapy

## 2019-06-22 DIAGNOSIS — M5126 Other intervertebral disc displacement, lumbar region: Secondary | ICD-10-CM | POA: Diagnosis not present

## 2019-06-23 ENCOUNTER — Other Ambulatory Visit: Payer: Self-pay

## 2019-06-23 ENCOUNTER — Ambulatory Visit
Admission: RE | Admit: 2019-06-23 | Discharge: 2019-06-23 | Disposition: A | Discharge status: Home / Self Care | Payer: Medicare Other | Source: Ambulatory Visit | Attending: Internal Medicine | Admitting: Internal Medicine

## 2019-06-23 DIAGNOSIS — Z1231 Encounter for screening mammogram for malignant neoplasm of breast: Secondary | ICD-10-CM

## 2019-06-23 DIAGNOSIS — Z780 Asymptomatic menopausal state: Secondary | ICD-10-CM | POA: Diagnosis not present

## 2019-06-23 DIAGNOSIS — Z13820 Encounter for screening for osteoporosis: Secondary | ICD-10-CM

## 2019-06-23 DIAGNOSIS — E2839 Other primary ovarian failure: Secondary | ICD-10-CM

## 2019-06-24 ENCOUNTER — Encounter: Payer: Self-pay | Admitting: Internal Medicine

## 2019-06-29 ENCOUNTER — Other Ambulatory Visit: Payer: Self-pay | Admitting: Neurosurgery

## 2019-06-29 DIAGNOSIS — M5126 Other intervertebral disc displacement, lumbar region: Secondary | ICD-10-CM

## 2019-06-30 DIAGNOSIS — D631 Anemia in chronic kidney disease: Secondary | ICD-10-CM | POA: Diagnosis not present

## 2019-06-30 DIAGNOSIS — R809 Proteinuria, unspecified: Secondary | ICD-10-CM | POA: Diagnosis not present

## 2019-06-30 DIAGNOSIS — N078 Hereditary nephropathy, not elsewhere classified with other morphologic lesions: Secondary | ICD-10-CM | POA: Diagnosis not present

## 2019-06-30 DIAGNOSIS — I129 Hypertensive chronic kidney disease with stage 1 through stage 4 chronic kidney disease, or unspecified chronic kidney disease: Secondary | ICD-10-CM | POA: Diagnosis not present

## 2019-06-30 DIAGNOSIS — N2581 Secondary hyperparathyroidism of renal origin: Secondary | ICD-10-CM | POA: Diagnosis not present

## 2019-06-30 DIAGNOSIS — N184 Chronic kidney disease, stage 4 (severe): Secondary | ICD-10-CM | POA: Diagnosis not present

## 2019-07-05 ENCOUNTER — Other Ambulatory Visit: Payer: Self-pay

## 2019-07-05 ENCOUNTER — Ambulatory Visit
Admission: RE | Admit: 2019-07-05 | Discharge: 2019-07-05 | Disposition: A | Discharge status: Home / Self Care | Payer: Medicare Other | Source: Ambulatory Visit | Attending: Neurosurgery | Admitting: Neurosurgery

## 2019-07-05 DIAGNOSIS — M5126 Other intervertebral disc displacement, lumbar region: Secondary | ICD-10-CM

## 2019-07-05 DIAGNOSIS — M545 Low back pain: Secondary | ICD-10-CM | POA: Diagnosis not present

## 2019-07-05 MED ORDER — METHYLPREDNISOLONE ACETATE 40 MG/ML INJ SUSP (RADIOLOG
120.0000 mg | Freq: Once | INTRAMUSCULAR | Status: AC
  Administered 2019-07-05: 13:00:00 120 mg via EPIDURAL

## 2019-07-05 MED ORDER — IOPAMIDOL (ISOVUE-M 200) INJECTION 41%
1.0000 mL | Freq: Once | INTRAMUSCULAR | Status: AC
  Administered 2019-07-05: 1 mL via EPIDURAL

## 2019-07-05 NOTE — Discharge Instructions (Signed)

## 2019-07-24 DIAGNOSIS — N3000 Acute cystitis without hematuria: Secondary | ICD-10-CM | POA: Diagnosis not present

## 2019-07-24 DIAGNOSIS — N3020 Other chronic cystitis without hematuria: Secondary | ICD-10-CM | POA: Diagnosis not present

## 2019-07-24 DIAGNOSIS — N952 Postmenopausal atrophic vaginitis: Secondary | ICD-10-CM | POA: Diagnosis not present

## 2019-07-27 DIAGNOSIS — M5126 Other intervertebral disc displacement, lumbar region: Secondary | ICD-10-CM | POA: Diagnosis not present

## 2019-08-02 DIAGNOSIS — M5116 Intervertebral disc disorders with radiculopathy, lumbar region: Secondary | ICD-10-CM | POA: Diagnosis not present

## 2019-08-02 DIAGNOSIS — M5126 Other intervertebral disc displacement, lumbar region: Secondary | ICD-10-CM | POA: Diagnosis not present

## 2019-09-04 DIAGNOSIS — N952 Postmenopausal atrophic vaginitis: Secondary | ICD-10-CM | POA: Diagnosis not present

## 2019-09-04 DIAGNOSIS — N3020 Other chronic cystitis without hematuria: Secondary | ICD-10-CM | POA: Diagnosis not present

## 2019-09-26 NOTE — Progress Notes (Signed)
Greater than 5 minutes, yet less than 10 minutes of time have been spent researching, coordinating, and implementing care for this patient today.  Thank you for the details you included in the comment boxes. Those details are very helpful in determining the best course of treatment for you and help us to provide the best care.  

## 2019-10-04 DIAGNOSIS — M48061 Spinal stenosis, lumbar region without neurogenic claudication: Secondary | ICD-10-CM | POA: Diagnosis not present

## 2019-10-04 DIAGNOSIS — M5126 Other intervertebral disc displacement, lumbar region: Secondary | ICD-10-CM | POA: Diagnosis not present

## 2019-10-04 DIAGNOSIS — M5127 Other intervertebral disc displacement, lumbosacral region: Secondary | ICD-10-CM | POA: Diagnosis not present

## 2019-10-17 ENCOUNTER — Other Ambulatory Visit: Payer: Self-pay | Admitting: Neurosurgery

## 2019-10-17 DIAGNOSIS — M5416 Radiculopathy, lumbar region: Secondary | ICD-10-CM

## 2019-10-23 ENCOUNTER — Other Ambulatory Visit: Payer: Self-pay | Admitting: Neurosurgery

## 2019-10-23 ENCOUNTER — Ambulatory Visit
Admission: RE | Admit: 2019-10-23 | Discharge: 2019-10-23 | Disposition: A | Discharge status: Home / Self Care | Payer: Medicare Other | Source: Ambulatory Visit | Attending: Neurosurgery | Admitting: Neurosurgery

## 2019-10-23 ENCOUNTER — Other Ambulatory Visit: Payer: Self-pay

## 2019-10-23 DIAGNOSIS — M5416 Radiculopathy, lumbar region: Secondary | ICD-10-CM

## 2019-10-23 MED ORDER — METHYLPREDNISOLONE ACETATE 40 MG/ML INJ SUSP (RADIOLOG
120.0000 mg | Freq: Once | INTRAMUSCULAR | Status: AC
  Administered 2019-10-23: 11:00:00 120 mg via EPIDURAL

## 2019-10-23 MED ORDER — IOPAMIDOL (ISOVUE-M 200) INJECTION 41%
1.0000 mL | Freq: Once | INTRAMUSCULAR | Status: AC
  Administered 2019-10-23: 11:00:00 1 mL via EPIDURAL

## 2019-10-23 NOTE — Discharge Instructions (Signed)

## 2019-11-09 DIAGNOSIS — N184 Chronic kidney disease, stage 4 (severe): Secondary | ICD-10-CM | POA: Diagnosis not present

## 2019-11-09 DIAGNOSIS — M5416 Radiculopathy, lumbar region: Secondary | ICD-10-CM | POA: Diagnosis not present

## 2019-11-09 DIAGNOSIS — M5126 Other intervertebral disc displacement, lumbar region: Secondary | ICD-10-CM | POA: Diagnosis not present

## 2019-11-15 DIAGNOSIS — Z01818 Encounter for other preprocedural examination: Secondary | ICD-10-CM | POA: Diagnosis not present

## 2019-11-16 DIAGNOSIS — L821 Other seborrheic keratosis: Secondary | ICD-10-CM | POA: Diagnosis not present

## 2019-11-16 DIAGNOSIS — Z86018 Personal history of other benign neoplasm: Secondary | ICD-10-CM | POA: Diagnosis not present

## 2019-11-16 DIAGNOSIS — Z87898 Personal history of other specified conditions: Secondary | ICD-10-CM | POA: Diagnosis not present

## 2019-11-16 DIAGNOSIS — D2271 Melanocytic nevi of right lower limb, including hip: Secondary | ICD-10-CM | POA: Diagnosis not present

## 2019-11-16 DIAGNOSIS — L578 Other skin changes due to chronic exposure to nonionizing radiation: Secondary | ICD-10-CM | POA: Diagnosis not present

## 2019-11-16 DIAGNOSIS — D2262 Melanocytic nevi of left upper limb, including shoulder: Secondary | ICD-10-CM | POA: Diagnosis not present

## 2019-11-16 DIAGNOSIS — D2272 Melanocytic nevi of left lower limb, including hip: Secondary | ICD-10-CM | POA: Diagnosis not present

## 2019-11-16 DIAGNOSIS — D225 Melanocytic nevi of trunk: Secondary | ICD-10-CM | POA: Diagnosis not present

## 2019-11-16 DIAGNOSIS — L723 Sebaceous cyst: Secondary | ICD-10-CM | POA: Diagnosis not present

## 2019-11-16 DIAGNOSIS — Z23 Encounter for immunization: Secondary | ICD-10-CM | POA: Diagnosis not present

## 2019-11-16 DIAGNOSIS — Z808 Family history of malignant neoplasm of other organs or systems: Secondary | ICD-10-CM | POA: Diagnosis not present

## 2019-11-17 DIAGNOSIS — D631 Anemia in chronic kidney disease: Secondary | ICD-10-CM | POA: Diagnosis not present

## 2019-11-17 DIAGNOSIS — N2581 Secondary hyperparathyroidism of renal origin: Secondary | ICD-10-CM | POA: Diagnosis not present

## 2019-11-17 DIAGNOSIS — N1832 Chronic kidney disease, stage 3b: Secondary | ICD-10-CM | POA: Diagnosis not present

## 2019-11-17 DIAGNOSIS — I129 Hypertensive chronic kidney disease with stage 1 through stage 4 chronic kidney disease, or unspecified chronic kidney disease: Secondary | ICD-10-CM | POA: Diagnosis not present

## 2019-11-17 DIAGNOSIS — R809 Proteinuria, unspecified: Secondary | ICD-10-CM | POA: Diagnosis not present

## 2019-11-22 DIAGNOSIS — Z9889 Other specified postprocedural states: Secondary | ICD-10-CM | POA: Diagnosis not present

## 2019-11-22 DIAGNOSIS — M5126 Other intervertebral disc displacement, lumbar region: Secondary | ICD-10-CM | POA: Diagnosis not present

## 2019-11-22 DIAGNOSIS — M5116 Intervertebral disc disorders with radiculopathy, lumbar region: Secondary | ICD-10-CM | POA: Diagnosis not present

## 2019-12-02 ENCOUNTER — Ambulatory Visit: Payer: Medicare Other

## 2019-12-07 ENCOUNTER — Ambulatory Visit: Payer: Medicare Other

## 2019-12-10 ENCOUNTER — Ambulatory Visit: Payer: Medicare Other

## 2019-12-11 ENCOUNTER — Ambulatory Visit: Payer: Medicare Other

## 2020-01-18 DIAGNOSIS — L723 Sebaceous cyst: Secondary | ICD-10-CM | POA: Diagnosis not present

## 2020-01-22 DIAGNOSIS — N952 Postmenopausal atrophic vaginitis: Secondary | ICD-10-CM | POA: Diagnosis not present

## 2020-01-22 DIAGNOSIS — R350 Frequency of micturition: Secondary | ICD-10-CM | POA: Diagnosis not present

## 2020-01-22 DIAGNOSIS — R8271 Bacteriuria: Secondary | ICD-10-CM | POA: Diagnosis not present

## 2020-01-22 DIAGNOSIS — N3020 Other chronic cystitis without hematuria: Secondary | ICD-10-CM | POA: Diagnosis not present

## 2020-03-06 ENCOUNTER — Other Ambulatory Visit: Payer: Self-pay | Admitting: Internal Medicine

## 2020-03-06 DIAGNOSIS — Z1231 Encounter for screening mammogram for malignant neoplasm of breast: Secondary | ICD-10-CM

## 2020-04-03 NOTE — Patient Instructions (Addendum)
  Blood work was ordered.     Medications reviewed and updated.  Changes include :   none  Your prescription(s) have been submitted to your pharmacy. Please take as directed and contact our office if you believe you are having problem(s) with the medication(s).     Please followup in 1 year  

## 2020-04-03 NOTE — Progress Notes (Signed)
Subjective:    Patient ID: Sarah Valentine, female    DOB: [birth year], 69 y.o.   MRN: [id number]  HPI The patient is here for follow up of their chronic medical problems, including htn, ckd, recurrent uti's.  She has two back surgeries since she was here last - she had a herniated disc - had surgery and then it re-herniated so needed surgery again.    She is now able to walk 1.5 miles twice a day.    She overall feels good and has no concerns.   Medications and allergies reviewed with patient and updated if appropriate.  Patient Active Problem List   Diagnosis Date Noted  . Lumbar radiculopathy 05/09/2019  . Frequent UTI 08/29/2018  . Left foot pain 07/25/2018  . Right ankle pain 04/23/2017  . Pain of right heel 04/23/2017  . Prediabetes 10/07/2016  . Lung nodule, solitary 10/07/2016  . Family history of breast cancer 11/25/2015  . 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 Incomplete RBBB   . GERD (gastroesophageal reflux disease)   . Hypertension   . Hyperlipidemia   . 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[place])   . Migraines   . BARRETTS ESOPHAGUS 09/05/2010  . PERSONAL HISTORY OF COLONIC POLYPS 09/05/2010    Current Outpatient Medications on File Prior to Visit  Medication Sig Dispense Refill  . Cholecalciferol (VITAMIN D3) 50 MCG (2000 UT) capsule     . Cranberry 1000 MG CAPS Take 500 mg by mouth daily.     Marland Kitchen estradiol (ESTRACE) 0.1 MG/GM vaginal cream Place 1 Applicatorful vaginally at bedtime. 42.5 g 8  . olmesartan (BENICAR) 20 MG tablet Take 1/2 tablet daily. (Patient taking differently: 20 mg. Take 1 tablet daily) 45 tablet 3   No current facility-administered medications on file prior to visit.    Past Medical History:  Diagnosis Date  . [place] 2010  . BCC (basal cell carcinoma), trunk 08/2013   removed from back ([facility])  . GERD (gastroesophageal reflux disease)   . Hx of adenomatous colonic polyps 12/2008  . Hyperlipidemia   . Hypertension   . Melanoma of  buttock ([place]) 2011   Melanoma in situ (L) buttock, dr Delman Cheadle  . Migraines   . Nephritis, hereditary    familial chronic nephritis  . 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 ABDOMINAL HYSTERECTOMY  1993  . BASAL CELL CARCINOMA EXCISION  08/2013   back  . CHOLECYSTECTOMY  2010  . COLONOSCOPY W/ BIOPSIES AND POLYPECTOMY  12/2008   polyp removed  . MELANOMA EXCISION Left 2011   buttock  . Perinephtric abscess with peritonitis  1980  . SQUAMOUS CELL CARCINOMA EXCISION  01/2014   back    Social History   Socioeconomic History  . Marital status: Married    Spouse name: Not on file  . Number of children: Not on file  . Years of education: Not on file  . Highest education level: Not on file  Occupational History  . Occupation: Optician, dispensing: [employer]  Tobacco Use  . Smoking status: Never Smoker  . Smokeless tobacco: Never Used  Substance and Sexual Activity  . Alcohol use: No    Alcohol/week: 0.0 standard drinks  . Drug use: No  . Sexual activity: Not on file  Other Topics Concern  . 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 Difficulty of Paying Living Expenses:   Food Insecurity:   . Worried About Charity fundraiser in the Last Year:   . Arboriculturist in the Last Year:   Transportation Needs:   . Film/video editor (Medical):   Marland Kitchen Lack of Transportation (Non-Medical):   Physical Activity:   . Days of Exercise per Week:   . Minutes of Exercise per Session:   Stress:   . Feeling of Stress :   Social Connections:   . Frequency of Communication with Friends and  Family:   . Frequency of Social Gatherings with Friends and Family:   . Attends Religious Services:   . Active Member of Clubs or Organizations:   . Attends Archivist Meetings:   Marland Kitchen Marital Status:     Family History  Problem Relation Age of Onset  . Breast cancer Mother 38  . Arthritis Mother   . Hyperlipidemia Mother   . Heart disease Mother   . Hypertension Mother   . Kidney disease Mother   . Diabetes Mother   . Arthritis Father   . Esophageal cancer Father 16  . Colon cancer Maternal Grandmother 78  . Arthritis Maternal Grandmother   . Hypertension Maternal Grandmother   . Kidney disease Maternal Grandmother   . Breast cancer Maternal Grandmother   . Diabetes Maternal Grandmother   . Breast cancer Maternal Aunt        x 2 aunt  . Arthritis Other   . Kidney disease Other        [place] nephritis -pt states mostly maternal side of family as the disease  . Esophageal cancer Paternal Uncle   . Rectal cancer Neg Hx   . Stomach cancer Neg Hx   . Pancreatic cancer Neg Hx     Review of Systems  Constitutional: Negative for chills and fever.  Respiratory: Negative for cough, shortness of breath and wheezing.   Cardiovascular: Positive for palpitations (occ). Negative for chest pain and leg swelling.  Gastrointestinal: Negative for abdominal pain, blood in stool, constipation, diarrhea and nausea.  Genitourinary: Negative for dysuria, frequency and hematuria.  Musculoskeletal: Negative for arthralgias.  Neurological: Positive for dizziness (with head movement to right). Negative for headaches.       Objective:   Vitals:   04/04/20 1001  BP: 120/72  Pulse: 63  Resp: 16  Temp: 98 F (36.7 C)  SpO2: 98%   BP Readings from Last 3 Encounters:  04/04/20 120/72  10/23/19 123/72  07/05/19 133/72   Wt Readings from Last 3 Encounters:  04/04/20 141 lb (64 kg)  05/09/19 143 lb 12.8 oz (65.2 kg)  03/31/19 144 lb 12.8 oz (65.7 kg)   Body mass index is 24.51  kg/m.   Physical Exam    Constitutional: Appears well-developed and well-nourished. No distress.  HENT:  Head: Normocephalic and atraumatic.  Neck: Neck supple. No tracheal deviation present. No thyromegaly present.  No cervical lymphadenopathy Cardiovascular: Normal rate, regular rhythm and normal heart sounds.   No murmur heard. No carotid bruit .  No edema Pulmonary/Chest: Effort normal and breath sounds normal. No respiratory distress. No has no wheezes. No  rales.  Abdomen: soft, NT, ND Skin: Skin is warm and dry. Not diaphoretic.  Psychiatric: Normal mood and affect. Behavior is normal.      Assessment & Plan:    See Problem List for Assessment and Plan of chronic medical problems.    This visit occurred during the SARS-CoV-2 public health emergency.  Safety protocols were in place, including screening questions prior to the visit, additional usage of staff PPE, and extensive cleaning of exam room while observing appropriate contact time as indicated for disinfecting solutions.

## 2020-04-04 ENCOUNTER — Encounter: Payer: Self-pay | Admitting: Internal Medicine

## 2020-04-04 ENCOUNTER — Other Ambulatory Visit: Payer: Self-pay

## 2020-04-04 ENCOUNTER — Ambulatory Visit (INDEPENDENT_AMBULATORY_CARE_PROVIDER_SITE_OTHER): Payer: Medicare Other | Admitting: Internal Medicine

## 2020-04-04 VITALS — BP 120/72 | HR 63 | Temp 98.0°F | Resp 16 | Ht 63.6 in | Wt 141.0 lb

## 2020-04-04 DIAGNOSIS — I10 Essential (primary) hypertension: Secondary | ICD-10-CM

## 2020-04-04 DIAGNOSIS — N078 Hereditary nephropathy, not elsewhere classified with other morphologic lesions: Secondary | ICD-10-CM

## 2020-04-04 DIAGNOSIS — R7303 Prediabetes: Secondary | ICD-10-CM

## 2020-04-04 DIAGNOSIS — N390 Urinary tract infection, site not specified: Secondary | ICD-10-CM

## 2020-04-04 DIAGNOSIS — Z85820 Personal history of malignant melanoma of skin: Secondary | ICD-10-CM

## 2020-04-04 LAB — COMPREHENSIVE METABOLIC PANEL
ALT: 15 U/L (ref 0–35)
AST: 21 U/L (ref 0–37)
Albumin: 4.2 g/dL (ref 3.5–5.2)
Alkaline Phosphatase: 97 U/L (ref 39–117)
BUN: 32 mg/dL — ABNORMAL HIGH (ref 6–23)
CO2: 27 mEq/L (ref 19–32)
Calcium: 9.6 mg/dL (ref 8.4–10.5)
Chloride: 108 mEq/L (ref 96–112)
Creatinine, Ser: 1.73 mg/dL — ABNORMAL HIGH (ref 0.40–1.20)
GFR: 29.32 mL/min — ABNORMAL LOW (ref >60.00)
Glucose, Bld: 98 mg/dL (ref 70–99)
Potassium: 4.4 mEq/L (ref 3.5–5.1)
Sodium: 141 mEq/L (ref 135–145)
Total Bilirubin: 0.3 mg/dL (ref 0.2–1.2)
Total Protein: 6.7 g/dL (ref 6.0–8.3)

## 2020-04-04 LAB — CBC WITH DIFFERENTIAL/PLATELET
Basophils Absolute: 0 10*3/uL (ref 0.0–0.1)
Basophils Relative: 0.6 % (ref 0.0–3.0)
Eosinophils Absolute: 0.1 10*3/uL (ref 0.0–0.7)
Eosinophils Relative: 1.9 % (ref 0.0–5.0)
HCT: 36.8 % (ref 36.0–46.0)
Hemoglobin: 12.4 g/dL (ref 12.0–15.0)
Lymphocytes Relative: 28 % (ref 12.0–46.0)
Lymphs Abs: 1.7 10*3/uL (ref 0.7–4.0)
MCHC: 33.7 g/dL (ref 30.0–36.0)
MCV: 86 fl (ref 78.0–100.0)
Monocytes Absolute: 0.5 10*3/uL (ref 0.1–1.0)
Monocytes Relative: 8.9 % (ref 3.0–12.0)
Neutro Abs: 3.7 10*3/uL (ref 1.4–7.7)
Neutrophils Relative %: 60.6 % (ref 43.0–77.0)
Platelets: 243 10*3/uL (ref 150.0–400.0)
RBC: 4.28 Mil/uL (ref 3.87–5.11)
RDW: 13.4 % (ref 11.5–15.5)
WBC: 6.1 10*3/uL (ref 4.0–10.5)

## 2020-04-04 LAB — LIPID PANEL
Cholesterol: 245 mg/dL — ABNORMAL HIGH (ref 0–200)
HDL: 50.1 mg/dL (ref >39.00)
LDL Cholesterol: 162 mg/dL — ABNORMAL HIGH (ref 0–99)
NonHDL: 194.68
Total CHOL/HDL Ratio: 5
Triglycerides: 163 mg/dL — ABNORMAL HIGH (ref 0.0–149.0)
VLDL: 32.6 mg/dL (ref 0.0–40.0)

## 2020-04-04 LAB — TSH: TSH: 1.14 u[IU]/mL (ref 0.35–4.50)

## 2020-04-04 LAB — HEMOGLOBIN A1C: Hgb A1c MFr Bld: 5.8 % (ref 4.6–6.5)

## 2020-04-04 MED ORDER — OLMESARTAN MEDOXOMIL 20 MG PO TABS: 20.0000 mg | ORAL_TABLET | Freq: Every day | ORAL | 3 refills | Status: DC

## 2020-04-04 MED ORDER — ESTRADIOL 0.1 MG/GM VA CREA: 1.0000 | TOPICAL_CREAM | Freq: Every day | VAGINAL | 11 refills | Status: DC

## 2020-04-04 NOTE — Assessment & Plan Note (Signed)
Chronic BP well controlled Current regimen effective and well tolerated Continue current medications at current doses cmp  

## 2020-04-04 NOTE — Assessment & Plan Note (Signed)
Last UTI 4 months ago Following with urology Using estrogen cream every other night

## 2020-04-04 NOTE — Assessment & Plan Note (Signed)
Chronic Check a1c Compliant with low sugar / carb diet Stressed regular exercise

## 2020-04-04 NOTE — Assessment & Plan Note (Signed)
Following with nephrology

## 2020-04-04 NOTE — Assessment & Plan Note (Signed)
Sees dermatology regularly

## 2020-04-09 ENCOUNTER — Telehealth: Payer: Self-pay | Admitting: Internal Medicine

## 2020-04-09 MED ORDER — ESTRADIOL 0.1 MG/GM VA CREA: 1.0000 | TOPICAL_CREAM | VAGINAL | 11 refills | Status: DC

## 2020-04-09 NOTE — Telephone Encounter (Signed)
New rx sent

## 2020-04-09 NOTE — Telephone Encounter (Signed)
New message:    [facility] is calling to verify the dosage of the estradiol (ESTRACE) 0.1 MG/GM vaginal cream. The rep states that the dosage is too much per the instructions. Ref # [id number]. Please advise.

## 2020-06-09 DIAGNOSIS — Z20828 Contact with and (suspected) exposure to other viral communicable diseases: Secondary | ICD-10-CM | POA: Diagnosis not present

## 2020-06-14 DIAGNOSIS — N189 Chronic kidney disease, unspecified: Secondary | ICD-10-CM | POA: Diagnosis not present

## 2020-06-14 DIAGNOSIS — N1832 Chronic kidney disease, stage 3b: Secondary | ICD-10-CM | POA: Diagnosis not present

## 2020-06-20 DIAGNOSIS — Z20828 Contact with and (suspected) exposure to other viral communicable diseases: Secondary | ICD-10-CM | POA: Diagnosis not present

## 2020-06-21 DIAGNOSIS — N2581 Secondary hyperparathyroidism of renal origin: Secondary | ICD-10-CM | POA: Diagnosis not present

## 2020-06-21 DIAGNOSIS — D631 Anemia in chronic kidney disease: Secondary | ICD-10-CM | POA: Diagnosis not present

## 2020-06-21 DIAGNOSIS — N184 Chronic kidney disease, stage 4 (severe): Secondary | ICD-10-CM | POA: Diagnosis not present

## 2020-06-21 DIAGNOSIS — I129 Hypertensive chronic kidney disease with stage 1 through stage 4 chronic kidney disease, or unspecified chronic kidney disease: Secondary | ICD-10-CM | POA: Diagnosis not present

## 2020-06-21 DIAGNOSIS — R809 Proteinuria, unspecified: Secondary | ICD-10-CM | POA: Diagnosis not present

## 2020-07-10 DIAGNOSIS — Z87440 Personal history of urinary (tract) infections: Secondary | ICD-10-CM | POA: Diagnosis not present

## 2020-07-10 DIAGNOSIS — N952 Postmenopausal atrophic vaginitis: Secondary | ICD-10-CM | POA: Diagnosis not present

## 2020-07-10 DIAGNOSIS — R3121 Asymptomatic microscopic hematuria: Secondary | ICD-10-CM | POA: Diagnosis not present

## 2020-07-12 DIAGNOSIS — Z1231 Encounter for screening mammogram for malignant neoplasm of breast: Secondary | ICD-10-CM

## 2020-07-31 ENCOUNTER — Ambulatory Visit
Admission: RE | Admit: 2020-07-31 | Discharge: 2020-07-31 | Disposition: A | Discharge status: Home / Self Care | Payer: Medicare Other | Source: Ambulatory Visit | Attending: Internal Medicine | Admitting: Internal Medicine

## 2020-07-31 ENCOUNTER — Other Ambulatory Visit: Payer: Self-pay

## 2020-07-31 DIAGNOSIS — Z1231 Encounter for screening mammogram for malignant neoplasm of breast: Secondary | ICD-10-CM

## 2020-08-20 DIAGNOSIS — Z23 Encounter for immunization: Secondary | ICD-10-CM | POA: Diagnosis not present

## 2020-11-21 DIAGNOSIS — Z808 Family history of malignant neoplasm of other organs or systems: Secondary | ICD-10-CM | POA: Diagnosis not present

## 2020-11-21 DIAGNOSIS — L821 Other seborrheic keratosis: Secondary | ICD-10-CM | POA: Diagnosis not present

## 2020-11-21 DIAGNOSIS — Z86018 Personal history of other benign neoplasm: Secondary | ICD-10-CM | POA: Diagnosis not present

## 2020-11-21 DIAGNOSIS — L918 Other hypertrophic disorders of the skin: Secondary | ICD-10-CM | POA: Diagnosis not present

## 2020-11-21 DIAGNOSIS — D225 Melanocytic nevi of trunk: Secondary | ICD-10-CM | POA: Diagnosis not present

## 2020-11-21 DIAGNOSIS — Z87898 Personal history of other specified conditions: Secondary | ICD-10-CM | POA: Diagnosis not present

## 2020-11-21 DIAGNOSIS — D2272 Melanocytic nevi of left lower limb, including hip: Secondary | ICD-10-CM | POA: Diagnosis not present

## 2020-11-21 DIAGNOSIS — D2271 Melanocytic nevi of right lower limb, including hip: Secondary | ICD-10-CM | POA: Diagnosis not present

## 2020-11-21 DIAGNOSIS — D2262 Melanocytic nevi of left upper limb, including shoulder: Secondary | ICD-10-CM | POA: Diagnosis not present

## 2020-11-21 DIAGNOSIS — L723 Sebaceous cyst: Secondary | ICD-10-CM | POA: Diagnosis not present

## 2020-11-21 DIAGNOSIS — L578 Other skin changes due to chronic exposure to nonionizing radiation: Secondary | ICD-10-CM | POA: Diagnosis not present

## 2020-12-24 DIAGNOSIS — N184 Chronic kidney disease, stage 4 (severe): Secondary | ICD-10-CM | POA: Diagnosis not present

## 2020-12-30 DIAGNOSIS — N184 Chronic kidney disease, stage 4 (severe): Secondary | ICD-10-CM | POA: Diagnosis not present

## 2020-12-30 DIAGNOSIS — N2581 Secondary hyperparathyroidism of renal origin: Secondary | ICD-10-CM | POA: Diagnosis not present

## 2020-12-30 DIAGNOSIS — R809 Proteinuria, unspecified: Secondary | ICD-10-CM | POA: Diagnosis not present

## 2020-12-30 DIAGNOSIS — D631 Anemia in chronic kidney disease: Secondary | ICD-10-CM | POA: Diagnosis not present

## 2020-12-30 DIAGNOSIS — I129 Hypertensive chronic kidney disease with stage 1 through stage 4 chronic kidney disease, or unspecified chronic kidney disease: Secondary | ICD-10-CM | POA: Diagnosis not present

## 2020-12-30 DIAGNOSIS — E872 Acidosis: Secondary | ICD-10-CM | POA: Diagnosis not present

## 2021-01-30 DIAGNOSIS — L820 Inflamed seborrheic keratosis: Secondary | ICD-10-CM | POA: Diagnosis not present

## 2021-01-30 DIAGNOSIS — L989 Disorder of the skin and subcutaneous tissue, unspecified: Secondary | ICD-10-CM | POA: Diagnosis not present

## 2021-01-30 DIAGNOSIS — L720 Epidermal cyst: Secondary | ICD-10-CM | POA: Diagnosis not present

## 2021-01-30 DIAGNOSIS — L723 Sebaceous cyst: Secondary | ICD-10-CM | POA: Diagnosis not present

## 2021-02-05 ENCOUNTER — Emergency Department (INDEPENDENT_AMBULATORY_CARE_PROVIDER_SITE_OTHER)
Admission: RE | Admit: 2021-02-05 | Discharge: 2021-02-05 | Disposition: A | Discharge status: Home / Self Care | Payer: Medicare Other | Source: Ambulatory Visit | Attending: Family Medicine | Admitting: Family Medicine

## 2021-02-05 ENCOUNTER — Other Ambulatory Visit: Payer: Self-pay

## 2021-02-05 VITALS — BP 120/80 | HR 77 | Temp 98.9°F | Resp 16 | Ht 63.5 in | Wt 137.0 lb

## 2021-02-05 DIAGNOSIS — J0100 Acute maxillary sinusitis, unspecified: Secondary | ICD-10-CM

## 2021-02-05 DIAGNOSIS — J069 Acute upper respiratory infection, unspecified: Secondary | ICD-10-CM

## 2021-02-05 MED ORDER — AMOXICILLIN 875 MG PO TABS: 875.0000 mg | ORAL_TABLET | Freq: Two times a day (BID) | ORAL | 0 refills | Status: DC

## 2021-02-05 NOTE — Discharge Instructions (Addendum)
Take plain guaifenesin (1200mg  extended release tabs such as Mucinex) twice daily, with plenty of water, for cough and congestion.  May continue Pseudoephedrine (30mg , one or two every 4 to 6 hours) for sinus congestion.  Get adequate rest.   May use Afrin nasal spray (or generic oxymetazoline) each morning for about 5 days and then discontinue.  Also recommend using saline nasal spray several times daily and saline nasal irrigation (AYR is a common brand).  Use Flonase nasal spray each morning after using Afrin nasal spray and saline nasal irrigation. Try warm salt water gargles for sore throat.  Stop all antihistamines for now, and other non-prescription cough/cold preparations. May take Delsym Cough Suppressant ("12 Hour Cough Relief") at bedtime for nighttime cough.

## 2021-02-05 NOTE — ED Provider Notes (Signed)
Sarah Valentine CARE    CSN: [id number] Arrival date & time: 02/05/21  0837      History   Chief Complaint Chief Complaint  Patient presents with  . Cough  . Nasal Congestion  . 9am appt    HPI Sarah Valentine is a 70 y.o. female.   Patient developed URI symptoms about 3 to 4 weeks ago with initial sore throat, nasal congestion, fatigue, and cough.  Her nasal congestion and mild cough have persisted.  She now has fullness in her ears, facial pressure, and green/yellow nasal drainage.  The history is provided by the patient.    Past Medical History:  Diagnosis Date  . Sarah Valentine 2010  . BCC (basal cell carcinoma), trunk 08/2013   removed from back ([facility])  . GERD (gastroesophageal reflux disease)   . Hx of adenomatous colonic polyps 12/2008  . Hyperlipidemia   . Hypertension   . 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[place]) 2011   Melanoma in situ (L) buttock, dr Delman Cheadle  . Migraines   . Nephritis, hereditary    familial chronic nephritis  . 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 Lumbar radiculopathy 05/09/2019  . Frequent UTI 08/29/2018  . Right ankle pain 04/23/2017  . Pain of right heel 04/23/2017  . Prediabetes 10/07/2016  . Lung nodule, solitary 10/07/2016  . Family history of breast cancer 11/25/2015  . 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 Incomplete RBBB   . Hypertension   . Hyperlipidemia   . History of melanoma   . Migraines   . BARRETTS ESOPHAGUS 09/05/2010  . PERSONAL HISTORY OF COLONIC POLYPS 09/05/2010    Past Surgical History:  Procedure Laterality Date  . ABDOMINAL HYSTERECTOMY  1993  . BASAL CELL CARCINOMA EXCISION  08/2013   back  . CHOLECYSTECTOMY  2010  . COLONOSCOPY W/ BIOPSIES AND POLYPECTOMY  12/2008   polyp removed  . MELANOMA EXCISION Left 2011   buttock  . Perinephtric abscess with peritonitis  1980  . SQUAMOUS CELL CARCINOMA EXCISION   01/2014   back    OB History   No obstetric history on file.      Home Medications    Prior to Admission medications   Medication Sig Start Date End Date Taking? Authorizing Provider  amoxicillin (AMOXIL) 875 MG tablet Take 1 tablet (875 mg total) by mouth 2 (two) times daily. 02/05/21  Yes Kandra Nicolas, MD  Cholecalciferol (VITAMIN D3) 50 MCG (2000 UT) capsule     [provider]  Cranberry 1000 MG CAPS Take 500 mg by mouth daily.     [provider]  estradiol (ESTRACE) 0.1 MG/GM vaginal cream Place 1 Applicatorful vaginally 2 (two) times a week. 04/11/20   Binnie Rail, MD  olmesartan (BENICAR) 20 MG tablet Take 1 tablet (20 mg total) by mouth daily. 04/04/20   Binnie Rail, MD  sodium bicarbonate 650 MG tablet Take 650 mg by mouth 2 (two) times daily. 12/30/20   [provider]    Family History Family History  Problem Relation Age of Onset  . Breast cancer Mother 73  . Arthritis Mother   . Hyperlipidemia Mother   . Heart disease Mother   . Hypertension Mother   . Kidney disease Mother   . Diabetes Mother   . Arthritis Father   . Esophageal cancer Father 68  . Colon cancer Maternal  Grandmother 58  . Arthritis Maternal Grandmother   . Hypertension Maternal Grandmother   . Kidney disease Maternal Grandmother   . Breast cancer Maternal Grandmother   . Diabetes Maternal Grandmother   . Breast cancer Maternal Aunt        x 2 aunt  . Arthritis Other   . Kidney disease Other        [place] nephritis -pt states mostly maternal side of family as the disease  . Esophageal cancer Paternal Uncle   . Rectal cancer Neg Hx   . Stomach cancer Neg Hx   . Pancreatic cancer Neg Hx     Social History Social History   Tobacco Use  . Smoking status: Never Smoker  . Smokeless tobacco: Never Used  Vaping Use  . Vaping Use: Never used  Substance Use Topics  . Alcohol use: No    Alcohol/week: 0.0 standard drinks  . Drug use: No     Allergies    Sulfonamide derivatives   Review of Systems Review of Systems No sore throat + cough No pleuritic pain No wheezing + nasal congestion + post-nasal drainage + sinus pain/pressure No itchy/red eyes ? earache No hemoptysis No SOB No fever/chills No nausea No vomiting No abdominal pain No diarrhea No urinary symptoms No skin rash No fatigue No myalgias No headache  Her symptoms have not improved with pseudoephedrine, Mucinex, and Flonase.  Physical Exam Triage Vital Signs ED Triage Vitals  Enc Vitals Group     BP 02/05/21 0900 120/80     Pulse Rate 02/05/21 0900 77     Resp 02/05/21 0900 16     Temp 02/05/21 0900 98.9 F (37.2 C)     Temp Source 02/05/21 0900 Oral     SpO2 02/05/21 0900 97 %     Weight 02/05/21 0858 137 lb (62.1 kg)     Height 02/05/21 0858 5' 3.5" (1.613 m)     Head Circumference --      Peak Flow --      Pain Score 02/05/21 0858 0     Pain Loc --      Pain Edu? --      Excl. in [place]? --    No data found.  Updated Vital Signs BP 120/80 (BP Location: Right Arm)   Pulse 77   Temp 98.9 F (37.2 C) (Oral)   Resp 16   Ht 5' 3.5" (1.613 m)   Wt 62.1 kg   SpO2 97%   BMI 23.89 kg/m   Visual Acuity Right Eye Distance:   Left Eye Distance:   Bilateral Distance:    Right Eye Near:   Left Eye Near:    Bilateral Near:     Physical Exam Nursing notes and Vital Signs reviewed. Appearance:  Patient appears stated age, and in no acute distress Eyes:  Pupils are equal, round, and reactive to light and accomodation.  Extraocular movement is intact.  Conjunctivae are not inflamed  Ears:  Canals normal.  Tympanic membranes normal.  Nose:  Congested turbinates.   Maxillary sinus tenderness is present.  Pharynx:  Normal Neck:  Supple.  Mildly enlarged lateral nodes are present, tender to palpation on the left.   Lungs:  Clear to auscultation.  Breath sounds are equal.  Moving air well. Heart:  Regular rate and rhythm without murmurs, rubs, or  gallops.  Abdomen:  Nontender without masses or hepatosplenomegaly.  Bowel sounds are present.  No CVA or flank tenderness.  Extremities:  No  edema.  Skin:  No rash present.   UC Treatments / Results  Labs (all labs ordered are listed, but only abnormal results are displayed) Labs Reviewed - No data to display  EKG   Radiology No results found.  Procedures Procedures (including critical care time)  Medications Ordered in UC Medications - No data to display  Initial Impression / Assessment and Plan / UC Course  I have reviewed the triage vital signs and the nursing notes.  Pertinent labs & imaging results that were available during my care of the patient were reviewed by me and considered in my medical decision making (see chart for details).    Begin amoxicillin. Followup with Family Doctor if not improved in 7 to 10 days.   Final Clinical Impressions(s) / UC Diagnoses   Final diagnoses:  Acute maxillary sinusitis, recurrence not specified  Viral URI with cough     Discharge Instructions     Take plain guaifenesin (1200mg  extended release tabs such as Mucinex) twice daily, with plenty of water, for cough and congestion.  May continue Pseudoephedrine (30mg , one or two every 4 to 6 hours) for sinus congestion.  Get adequate rest.   May use Afrin nasal spray (or generic oxymetazoline) each morning for about 5 days and then discontinue.  Also recommend using saline nasal spray several times daily and saline nasal irrigation (AYR is a common brand).  Use Flonase nasal spray each morning after using Afrin nasal spray and saline nasal irrigation. Try warm salt water gargles for sore throat.  Stop all antihistamines for now, and other non-prescription cough/cold preparations. May take Delsym Cough Suppressant ("12 Hour Cough Relief") at bedtime for nighttime cough.      ED Prescriptions    Medication Sig Dispense Auth. Provider   amoxicillin (AMOXIL) 875 MG tablet Take 1  tablet (875 mg total) by mouth 2 (two) times daily. 20 tablet Kandra Nicolas, MD        Kandra Nicolas, MD 02/07/21 1154

## 2021-02-05 NOTE — ED Triage Notes (Signed)
Pt c/o green/yellow nasal congestion, post nasal congestion, fullness in ears and now cough x 3-4 wks. Taking Mucinex and Flonase.

## 2021-03-05 DIAGNOSIS — N3000 Acute cystitis without hematuria: Secondary | ICD-10-CM | POA: Diagnosis not present

## 2021-03-05 DIAGNOSIS — R8279 Other abnormal findings on microbiological examination of urine: Secondary | ICD-10-CM | POA: Diagnosis not present

## 2021-03-10 DIAGNOSIS — Z23 Encounter for immunization: Secondary | ICD-10-CM | POA: Diagnosis not present

## 2021-03-31 ENCOUNTER — Other Ambulatory Visit: Payer: Self-pay | Admitting: Internal Medicine

## 2021-04-09 DIAGNOSIS — U071 COVID-19: Secondary | ICD-10-CM | POA: Insufficient documentation

## 2021-04-09 HISTORY — DX: COVID-19: U07.1

## 2021-04-13 ENCOUNTER — Emergency Department (INDEPENDENT_AMBULATORY_CARE_PROVIDER_SITE_OTHER)
Admission: EM | Admit: 2021-04-13 | Discharge: 2021-04-13 | Disposition: A | Discharge status: Home / Self Care | Payer: Medicare Other | Source: Home / Self Care

## 2021-04-13 ENCOUNTER — Encounter: Payer: Self-pay | Admitting: Emergency Medicine

## 2021-04-13 ENCOUNTER — Emergency Department (INDEPENDENT_AMBULATORY_CARE_PROVIDER_SITE_OTHER): Payer: Medicare Other

## 2021-04-13 ENCOUNTER — Other Ambulatory Visit: Payer: Self-pay

## 2021-04-13 DIAGNOSIS — R079 Chest pain, unspecified: Secondary | ICD-10-CM | POA: Diagnosis not present

## 2021-04-13 DIAGNOSIS — R059 Cough, unspecified: Secondary | ICD-10-CM

## 2021-04-13 DIAGNOSIS — J309 Allergic rhinitis, unspecified: Secondary | ICD-10-CM | POA: Diagnosis not present

## 2021-04-13 DIAGNOSIS — U071 COVID-19: Secondary | ICD-10-CM | POA: Diagnosis not present

## 2021-04-13 MED ORDER — BENZONATATE 200 MG PO CAPS: 200.0000 mg | ORAL_CAPSULE | Freq: Three times a day (TID) | ORAL | 0 refills | Status: AC | PRN

## 2021-04-13 MED ORDER — FEXOFENADINE HCL 180 MG PO TABS: 180.0000 mg | ORAL_TABLET | Freq: Every day | ORAL | 0 refills | Status: DC

## 2021-04-13 NOTE — ED Triage Notes (Signed)
Patient states that she tested positive for COVID from a home test Wednesday.  Patient is having some chest heaviness, slight cough, febrile Wednesday and Thursday.  Patient has taken Tylenol.  Patient is vaccinated for COVID.

## 2021-04-13 NOTE — Discharge Instructions (Addendum)
Advised/instructed patient to take Allegra 180 mg daily for the next 5 days, then as needed.  Advised patient may use Tessalon Perles daily, as needed for cough.  Encourage patient increase daily water intake while taking these medications.

## 2021-04-13 NOTE — ED Provider Notes (Signed)
[name] CARE    CSN: [id number] Arrival date & time: 04/13/21  1303      History   Chief Complaint Chief Complaint  Patient presents with   COVID positive    HPI Sarah Valentine is a 70 y.o. female.   HPI 70 year old female presents with COVID-19 positive on home test on Wednesday, 04/09/2021.  Reports chest heaviness, slight cough and was febrile on Wednesday and Thursday of last week.  PMH significant for melanoma, incomplete RBBB, and solitary lung nodule.  CXR prior to exam today was unremarkable.  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   excised from back Delman Cheadle)    Patient Active Problem List   Diagnosis Date Noted   Lumbar radiculopathy 05/09/2019   Frequent UTI 08/29/2018   Right ankle pain 04/23/2017   Pain of right heel 04/23/2017   Prediabetes 10/07/2016   Lung nodule, solitary 10/07/2016   Family history of breast cancer 11/25/2015   Nephritis, hereditary 11/14/2014   Incomplete RBBB    Hypertension    Hyperlipidemia    History of melanoma    Migraines    BARRETTS ESOPHAGUS 09/05/2010   PERSONAL HISTORY OF COLONIC POLYPS 09/05/2010    Past Surgical History:  Procedure Laterality Date   ABDOMINAL HYSTERECTOMY  1993   BASAL CELL CARCINOMA EXCISION  08/2013   back   CHOLECYSTECTOMY  2010   COLONOSCOPY W/ BIOPSIES AND POLYPECTOMY  12/2008   polyp removed   MELANOMA EXCISION Left 2011   buttock   Perinephtric abscess with peritonitis  1980   SQUAMOUS CELL CARCINOMA EXCISION  01/2014   back    OB History    No obstetric history on file.      Home Medications    Prior to Admission medications   Medication Sig Start Date End Date Taking? Authorizing Provider  benzonatate (TESSALON) 200 MG capsule Take 1 capsule (200 mg total) by mouth 3 (three) times daily as needed for up to 7 days for cough. 04/13/21 04/20/21 Yes Eliezer Lofts, FNP  Cholecalciferol (VITAMIN D3) 50 MCG (2000 UT) capsule    Yes [provider]  Cranberry 1000 MG CAPS Take 500 mg by mouth daily.    Yes [provider]  estradiol (ESTRACE) 0.1 MG/GM vaginal cream Place 1 Applicatorful vaginally 2 (two) times a week. 04/11/20  Yes Burns, Claudina Lick, MD  fexofenadine [facility] ALLERGY) 180 MG tablet Take 1 tablet (180 mg total) by mouth daily for 15 days. 04/13/21 04/28/21 Yes Eliezer Lofts, FNP  olmesartan (BENICAR) 20 MG tablet TAKE 1 TABLET DAILY 04/01/21  Yes Burns, Claudina Lick, MD  sodium bicarbonate 650 MG tablet Take 650 mg by mouth 2 (two) times daily. 12/30/20  Yes [provider]  amoxicillin (AMOXIL) 875 MG tablet Take 1 tablet (875 mg total) by mouth 2 (two) times daily. 02/05/21   Kandra Nicolas, MD    Family History Family History  Problem Relation Age of Onset   Breast cancer Mother 58  Arthritis Mother    Hyperlipidemia Mother    Heart disease Mother    Hypertension Mother    Kidney disease Mother    Diabetes Mother    Arthritis Father    Esophageal cancer Father 27   Colon cancer Maternal Grandmother 8   Arthritis Maternal Grandmother    Hypertension Maternal Grandmother    Kidney disease Maternal Grandmother    Breast cancer Maternal Grandmother    Diabetes Maternal Grandmother    Breast cancer Maternal Aunt        x 2 aunt   Arthritis Other    Kidney disease Other        [place] nephritis -pt states mostly maternal side of family as the disease   Esophageal cancer Paternal Uncle    Rectal cancer Neg Hx    Stomach cancer Neg Hx    Pancreatic cancer Neg Hx     Social  History Social History   Tobacco Use   Smoking status: Never   Smokeless tobacco: Never  Vaping Use   Vaping Use: Never used  Substance Use Topics   Alcohol use: No    Alcohol/week: 0.0 standard drinks   Drug use: No     Allergies   Sulfonamide derivatives   Review of Systems Review of Systems  Constitutional:  Positive for fatigue and fever.  Eyes: Negative.   Respiratory:  Positive for choking.   Cardiovascular: Negative.   Gastrointestinal: Negative.   Genitourinary: Negative.   Musculoskeletal:  Positive for myalgias.  Skin: Negative.   Neurological: Negative.     Physical Exam Triage Vital Signs ED Triage Vitals  Enc Vitals Group     BP 04/13/21 1350 119/86     Pulse Rate 04/13/21 1350 75     Resp 04/13/21 1350 16     Temp 04/13/21 1350 98.8 F (37.1 C)     Temp Source 04/13/21 1350 Oral     SpO2 04/13/21 1350 97 %     Weight --      Height --      Head Circumference --      Peak Flow --      Pain Score 04/13/21 1352 3     Pain Loc --      Pain Edu? --      Excl. in [place]? --    No data found.  Updated Vital Signs BP 119/86 (BP Location: Right Arm)   Pulse 75   Temp 98.8 F (37.1 C) (Oral)   Resp 16   SpO2 97%       Physical Exam Vitals and nursing note reviewed.  Constitutional:      General: She is not in acute distress.    Appearance: Normal appearance. She is not ill-appearing.  HENT:     Head: Normocephalic and atraumatic.     Right Ear: Tympanic membrane and ear canal normal.     Left Ear: Tympanic membrane and ear canal normal.     Mouth/Throat:     Mouth: Mucous membranes are moist.     Pharynx: Oropharynx is clear.     Comments: Moderate amount of clear drainage of posterior oropharynx noted Eyes:     Extraocular Movements: Extraocular movements intact.     Conjunctiva/sclera: Conjunctivae normal.     Pupils: Pupils are equal, round, and reactive to light.  Cardiovascular:     Rate and Rhythm: Normal rate and regular rhythm.      Pulses: Normal pulses.     Heart sounds:  Normal heart sounds.  Pulmonary:     Effort: Pulmonary effort is normal. No respiratory distress.     Breath sounds: Normal breath sounds. No stridor. No wheezing, rhonchi or rales.     Comments: Infrequent nonproductive cough noted on exam Musculoskeletal:        General: Normal range of motion.     Cervical back: Normal range of motion and neck supple. No tenderness.  Lymphadenopathy:     Cervical: No cervical adenopathy.  Skin:    General: Skin is warm and dry.  Neurological:     General: No focal deficit present.     Mental Status: She is alert and oriented to person, place, and time.  Psychiatric:        Mood and Affect: Mood normal.        Behavior: Behavior normal.     UC Treatments / Results  Labs (all labs ordered are listed, but only abnormal results are displayed) Labs Reviewed - No data to display  EKG   Radiology DG Chest 2 View  Result Date: 04/13/2021 CLINICAL DATA:  Chest pain. EXAM: CHEST - 2 VIEW COMPARISON:  05/11/2017 FINDINGS: The cardiac silhouette, mediastinal and hilar contours are normal. The lungs are clear. No pleural effusions or pulmonary lesions. The bony thorax is intact. IMPRESSION: No acute cardiopulmonary findings. Electronically Signed   By: Marijo Sanes M.D.   On: 04/13/2021 14:42    Procedures Procedures (including critical care time)  Medications Ordered in UC Medications - No data to display  Initial Impression / Assessment and Plan / UC Course  I have reviewed the triage vital signs and the nursing notes.  Pertinent labs & imaging results that were available during my care of the patient were reviewed by me and considered in my medical decision making (see chart for details).    MDM: 1. COVID-19, 2.  Cough, 3.  Allergic rhinitis.  Patient discharged home, hemodynamically stable. Final Clinical Impressions(s) / UC Diagnoses   Final diagnoses:  COVID-19  Cough  Allergic rhinitis,  unspecified seasonality, unspecified trigger     Discharge Instructions      Advised/instructed patient to take Allegra 180 mg daily for the next 5 days, then as needed.  Advised patient may use Tessalon Perles daily, as needed for cough.  Encourage patient increase daily water intake while taking these medications.     ED Prescriptions     Medication Sig Dispense Auth. Provider   benzonatate (TESSALON) 200 MG capsule Take 1 capsule (200 mg total) by mouth 3 (three) times daily as needed for up to 7 days for cough. 30 capsule Eliezer Lofts, FNP   fexofenadine [facility] ALLERGY) 180 MG tablet Take 1 tablet (180 mg total) by mouth daily for 15 days. 15 tablet Eliezer Lofts, FNP      PDMP not reviewed this encounter.   Eliezer Lofts, [place] 04/13/21 1506

## 2021-04-21 DIAGNOSIS — N3000 Acute cystitis without hematuria: Secondary | ICD-10-CM | POA: Diagnosis not present

## 2021-04-21 DIAGNOSIS — N952 Postmenopausal atrophic vaginitis: Secondary | ICD-10-CM | POA: Diagnosis not present

## 2021-04-29 ENCOUNTER — Ambulatory Visit: Payer: Medicare Other | Admitting: Internal Medicine

## 2021-05-09 ENCOUNTER — Other Ambulatory Visit: Payer: Self-pay | Admitting: Internal Medicine

## 2021-05-09 DIAGNOSIS — Z1231 Encounter for screening mammogram for malignant neoplasm of breast: Secondary | ICD-10-CM

## 2021-05-11 DIAGNOSIS — Z20822 Contact with and (suspected) exposure to covid-19: Secondary | ICD-10-CM | POA: Diagnosis not present

## 2021-05-29 IMAGING — DX LUMBAR SPINE - COMPLETE 4+ VIEW
5 series · 5 of 5 positions shown · non-contrast
Comparison: CT abdomen and pelvis 12/24/2014

CLINICAL DATA: RIGHT leg and lower back pain for 4 months no known
injury, lumbar radiculopathy

EXAM:
LUMBAR SPINE - COMPLETE 4+ VIEW

[l-spine ap]
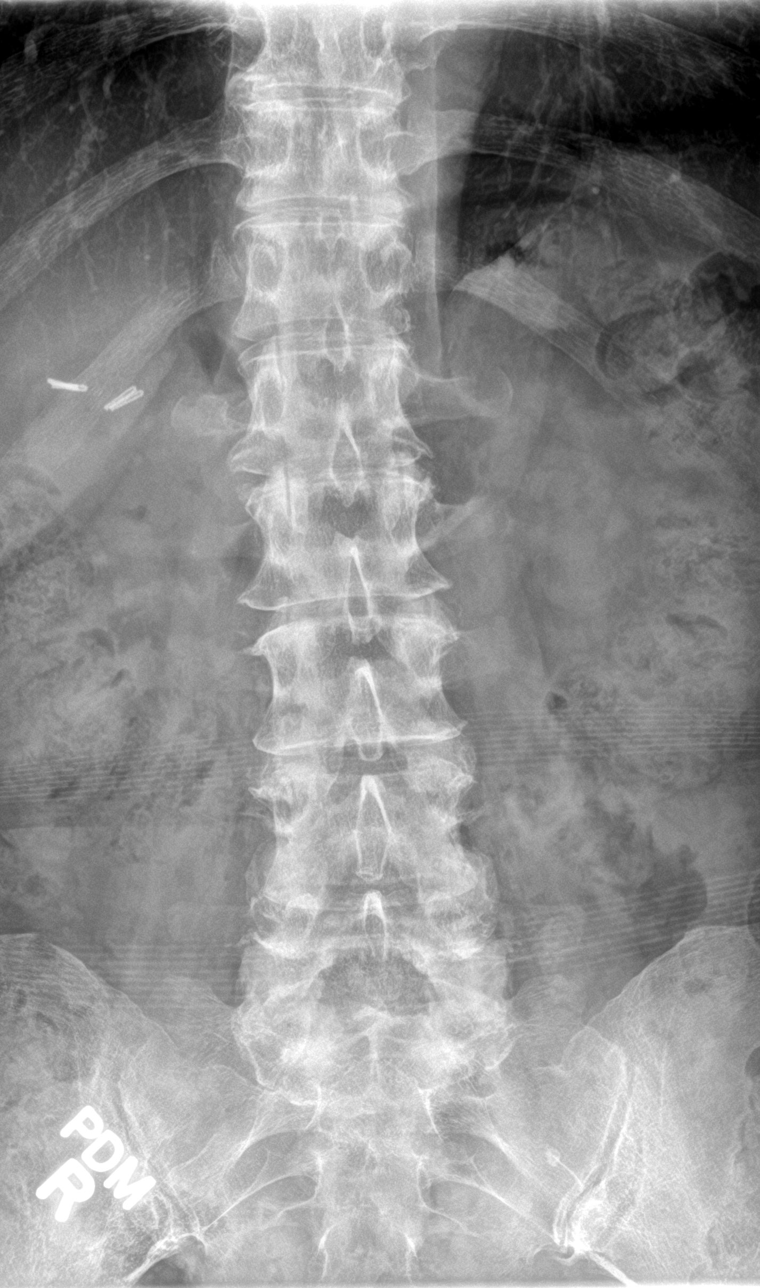

[l-spine obl (1 of 2)]
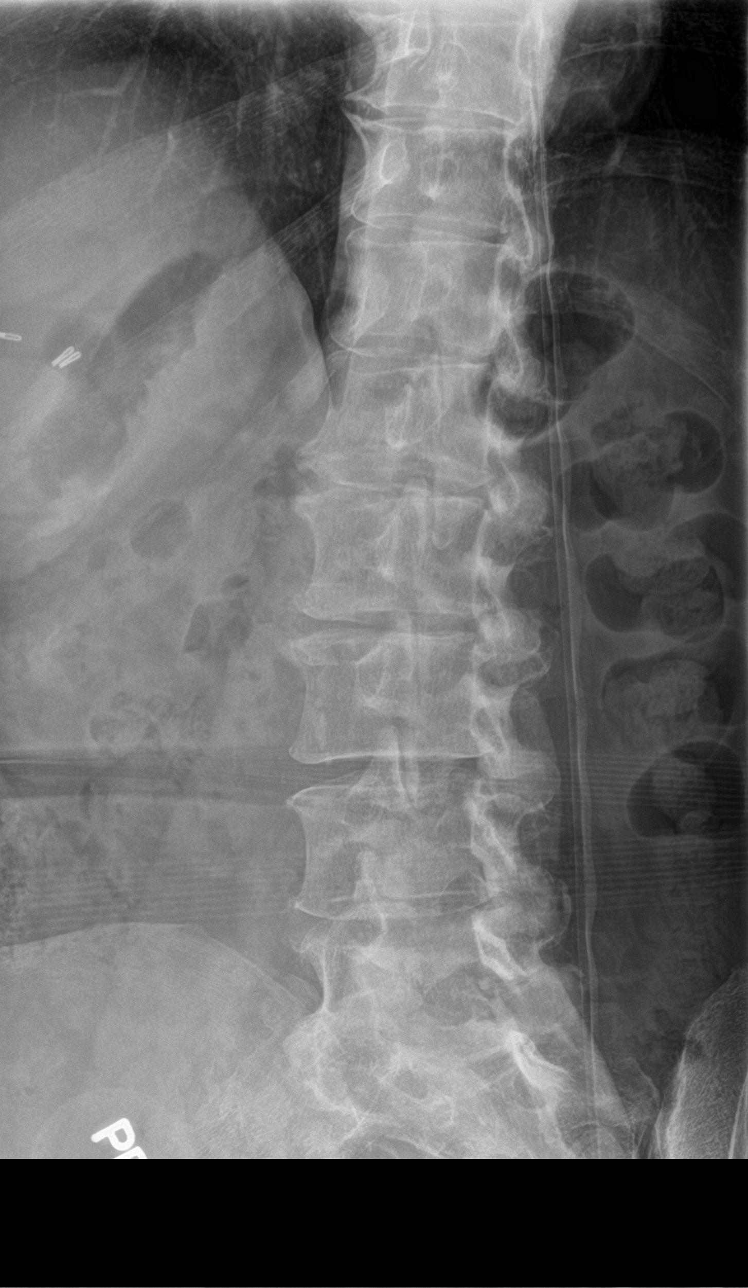

[l-spine obl (2 of 2)]
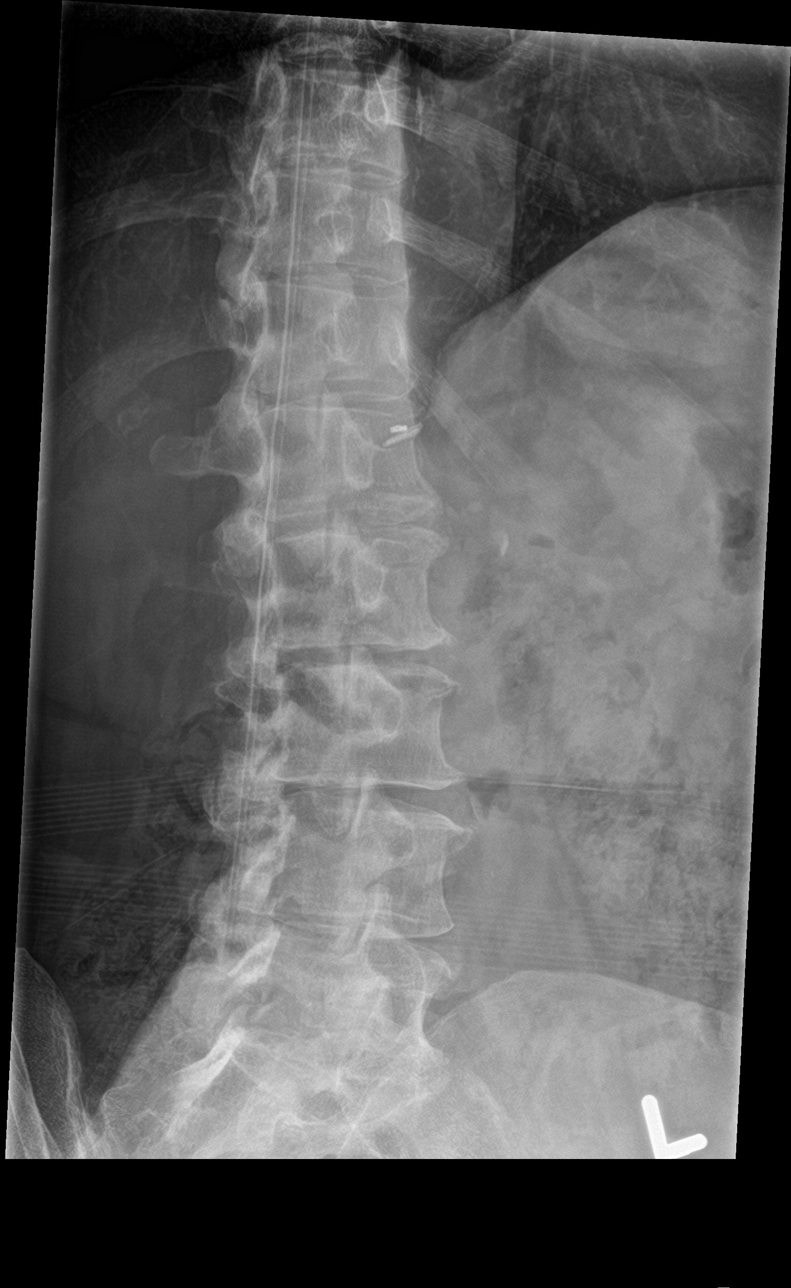

[l-spine lat]
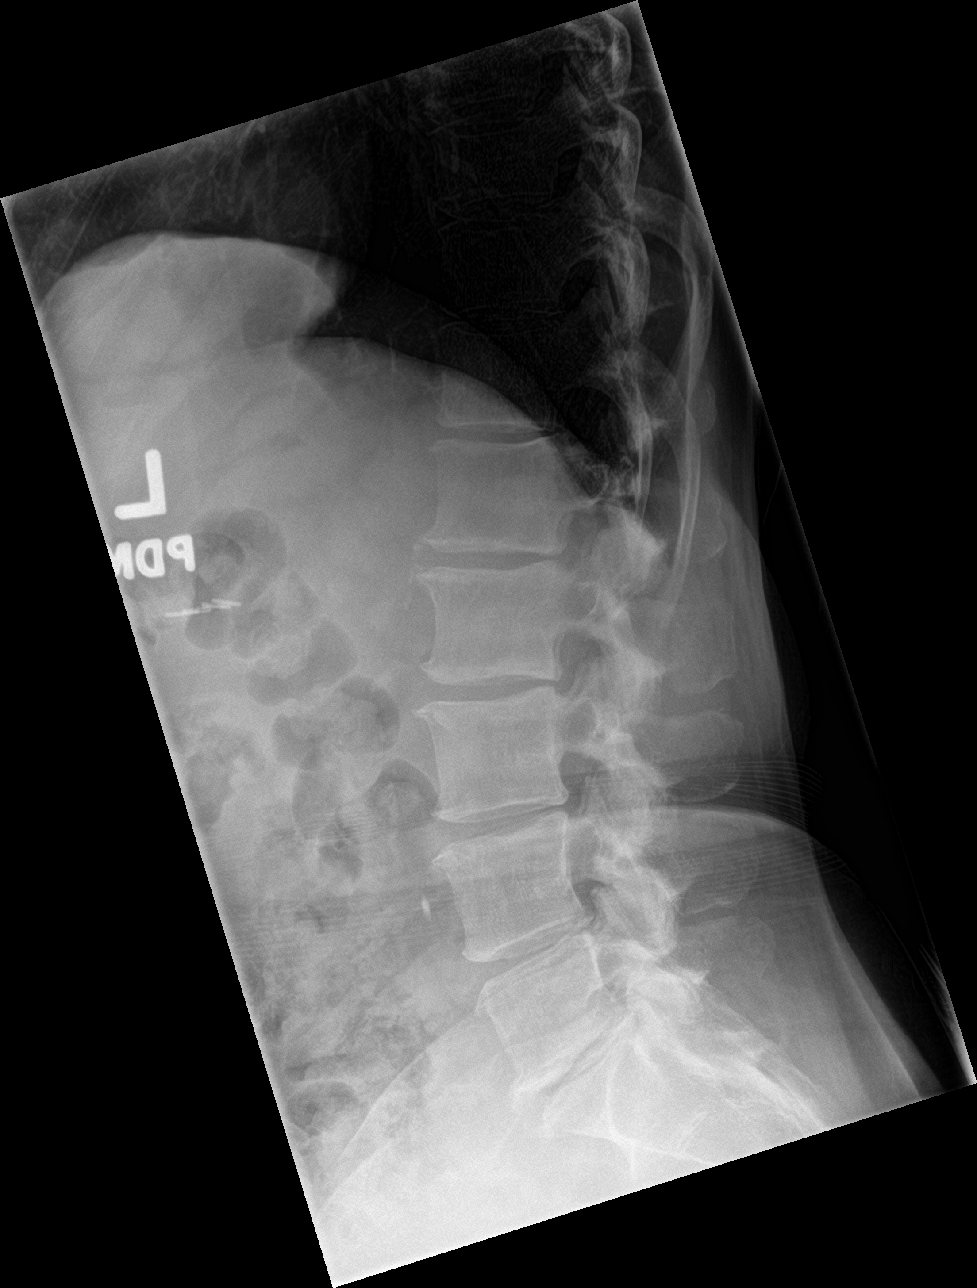

[l-spine spot]
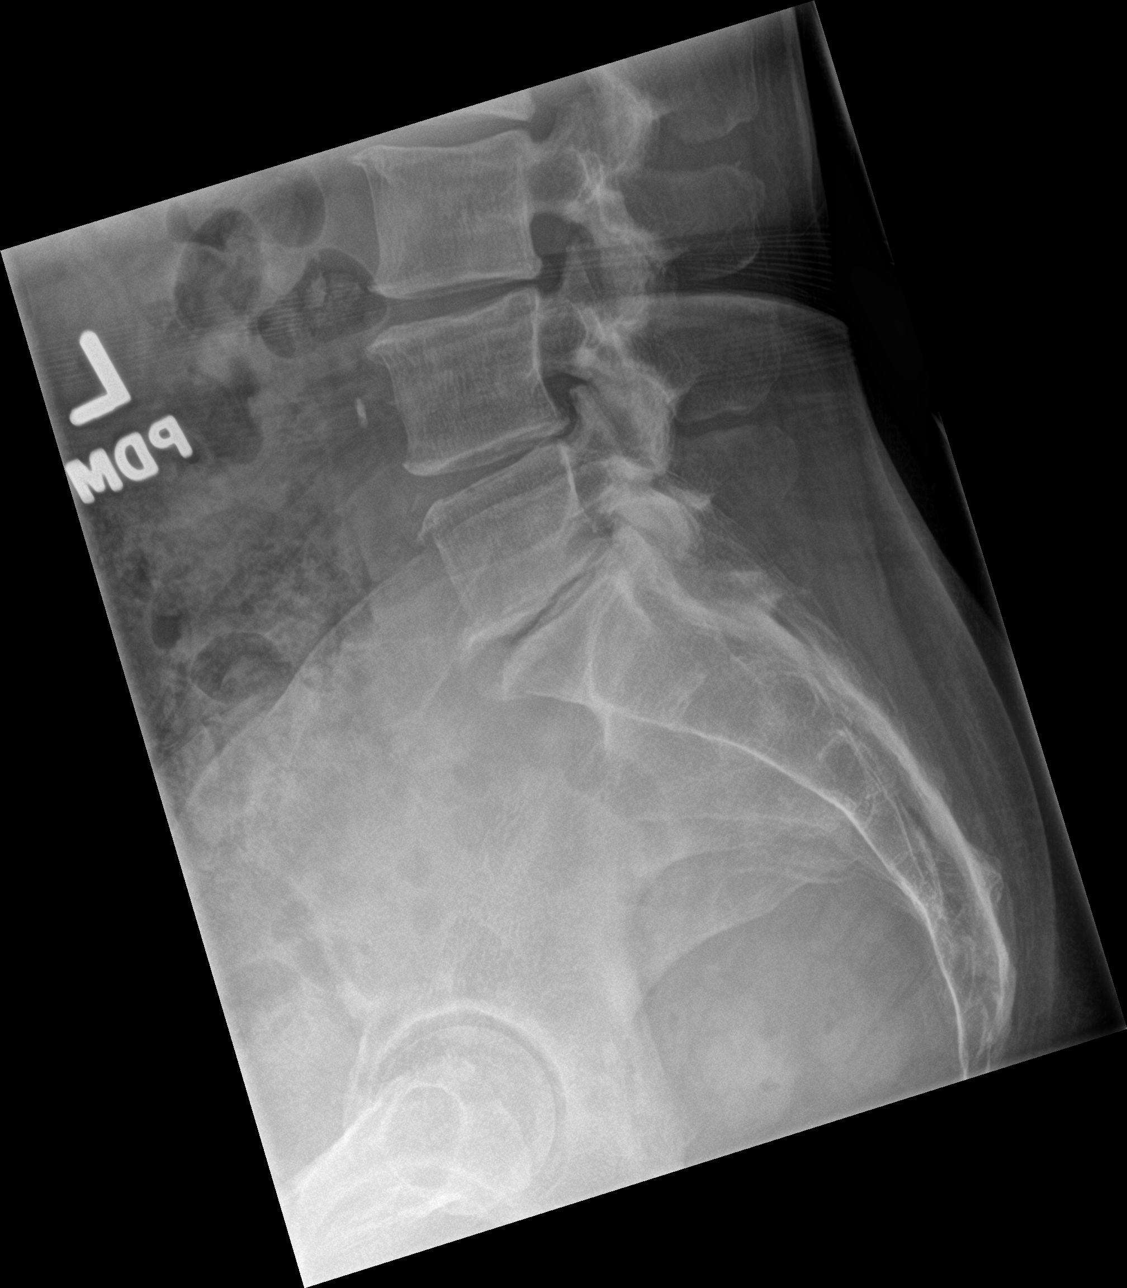

[5 of 5 positions shown; findings below may reference images not displayed]

FINDINGS: Five non-rib-bearing lumbar vertebra.

Vertebral body heights maintained.

Scattered mild disc space narrowing and endplate spur formation.

No fracture, subluxation, or bone destruction.

Minimal facet degenerative changes lower lumbar spine.

No spondylolysis.

SI joints preserved.
IMPRESSION: Degenerative disc disease changes of the lumbar spine.

No acute abnormalities.

## 2021-06-05 ENCOUNTER — Encounter: Payer: Self-pay | Admitting: Gastroenterology

## 2021-06-17 NOTE — Patient Instructions (Addendum)
Blood work was ordered.     Medications changes include :  none   Your prescription(s) have been submitted to your pharmacy. Please take as directed and contact our office if you believe you are having problem(s) with the medication(s).   Please followup in 1 year   Health Maintenance, Female Adopting a healthy lifestyle and getting preventive care are important in promoting health and wellness. Ask your health care provider about: The right schedule for you to have regular tests and exams. Things you can do on your own to prevent diseases and keep yourself healthy. What should I know about diet, weight, and exercise? Eat a healthy diet  Eat a diet that includes plenty of vegetables, fruits, low-fat dairy products, and lean protein. Do not eat a lot of foods that are high in solid fats, added sugars, or sodium.  Maintain a healthy weight Body mass index (BMI) is used to identify weight problems. It estimates body fat based on height and weight. Your health care provider can help determineyour BMI and help you achieve or maintain a healthy weight. Get regular exercise Get regular exercise. This is one of the most important things you can do for your health. Most adults should: Exercise for at least 150 minutes each week. The exercise should increase your heart rate and make you sweat (moderate-intensity exercise). Do strengthening exercises at least twice a week. This is in addition to the moderate-intensity exercise. Spend less time sitting. Even light physical activity can be beneficial. Watch cholesterol and blood lipids Have your blood tested for lipids and cholesterol at 69 years of age, then havethis test every 5 years. Have your cholesterol levels checked more often if: Your lipid or cholesterol levels are high. You are older than 69 years of age. You are at high risk for heart disease. What should I know about cancer screening? Depending on your health history and family  history, you may need to have cancer screening at various ages. This may include screening for: Breast cancer. Cervical cancer. Colorectal cancer. Skin cancer. Lung cancer. What should I know about heart disease, diabetes, and high blood pressure? Blood pressure and heart disease High blood pressure causes heart disease and increases the risk of stroke. This is more likely to develop in people who have high blood pressure readings, are of African descent, or are overweight. Have your blood pressure checked: Every 3-5 years if you are 18-39 years of age. Every year if you are 40 years old or older. Diabetes Have regular diabetes screenings. This checks your fasting blood sugar level. Have the screening done: Once every three years after age 40 if you are at a normal weight and have a low risk for diabetes. More often and at a younger age if you are overweight or have a high risk for diabetes. What should I know about preventing infection? Hepatitis B If you have a higher risk for hepatitis B, you should be screened for this virus. Talk with your health care provider to find out if you are at risk forhepatitis B infection. Hepatitis C Testing is recommended for: Everyone born from 1945 through 1965. Anyone with known risk factors for hepatitis C. Sexually transmitted infections (STIs) Get screened for STIs, including gonorrhea and chlamydia, if: You are sexually active and are younger than 69 years of age. You are older than 69 years of age and your health care provider tells you that you are at risk for this type of infection. Your sexual activity has   changed since you were last screened, and you are at increased risk for chlamydia or gonorrhea. Ask your health care provider if you are at risk. Ask your health care provider about whether you are at high risk for HIV. Your health care provider may recommend a prescription medicine to help prevent HIV infection. If you choose to take  medicine to prevent HIV, you should first get tested for HIV. You should then be tested every 3 months for as long as you are taking the medicine. Pregnancy If you are about to stop having your period (premenopausal) and you may become pregnant, seek counseling before you get pregnant. Take 400 to 800 micrograms (mcg) of folic acid every day if you become pregnant. Ask for birth control (contraception) if you want to prevent pregnancy. Osteoporosis and menopause Osteoporosis is a disease in which the bones lose minerals and strength with aging. This can result in bone fractures. If you are 65 years old or older, or if you are at risk for osteoporosis and fractures, ask your health care provider if you should: Be screened for bone loss. Take a calcium or vitamin D supplement to lower your risk of fractures. Be given hormone replacement therapy (HRT) to treat symptoms of menopause. Follow these instructions at home: Lifestyle Do not use any products that contain nicotine or tobacco, such as cigarettes, e-cigarettes, and chewing tobacco. If you need help quitting, ask your health care provider. Do not use street drugs. Do not share needles. Ask your health care provider for help if you need support or information about quitting drugs. Alcohol use Do not drink alcohol if: Your health care provider tells you not to drink. You are pregnant, may be pregnant, or are planning to become pregnant. If you drink alcohol: Limit how much you use to 0-1 drink a day. Limit intake if you are breastfeeding. Be aware of how much alcohol is in your drink. In the [place] one drink equals one 12 oz bottle of beer (355 mL), one 5 oz glass of wine (148 mL), or one 1 oz glass of hard liquor (44 mL). General instructions Schedule regular health, dental, and eye exams. Stay current with your vaccines. Tell your health care provider if: You often feel depressed. You have ever been abused or do not feel safe at  home. Summary Adopting a healthy lifestyle and getting preventive care are important in promoting health and wellness. Follow your health care provider's instructions about healthy diet, exercising, and getting tested or screened for diseases. Follow your health care provider's instructions on monitoring your cholesterol and blood pressure. This information is not intended to replace advice given to you by your health care provider. Make sure you discuss any questions you have with your healthcare provider. Document Revised: 10/12/2018 Document Reviewed: 10/12/2018 Elsevier Patient Education  2022 Elsevier Inc.  

## 2021-06-17 NOTE — Progress Notes (Signed)
Subjective:    Patient ID: Sarah Valentine, female    DOB: 09/19/1951, 70 y.o.   MRN: [id number]   This visit occurred during the SARS-CoV-2 public health emergency.  Safety protocols were in place, including screening questions prior to the visit, additional usage of staff PPE, and extensive cleaning of exam room while observing appropriate contact time as indicated for disinfecting solutions.    HPI She is here for follow up.   Sister died recently.  Her husband just had surgery for cancer recurrence.  This summer there has been extra stress, but she feels she is doing well overall.      Medications and allergies reviewed with patient and updated if appropriate.  Patient Active Problem List   Diagnosis Date Noted   Lumbar radiculopathy 05/09/2019   Frequent UTI 08/29/2018   Prediabetes 10/07/2016   Lung nodule, solitary 10/07/2016   Family history of breast cancer 11/25/2015   Nephritis, hereditary 11/14/2014   Incomplete RBBB    Hypertension    Hyperlipidemia    History of melanoma    Migraines    BARRETTS ESOPHAGUS 09/05/2010   PERSONAL HISTORY OF COLONIC POLYPS 09/05/2010    Current Outpatient Medications on File Prior to Visit  Medication Sig Dispense Refill   Cholecalciferol (VITAMIN D3) 50 MCG (2000 UT) capsule      Cranberry 1000 MG CAPS Take 500 mg by mouth daily.      sodium bicarbonate 650 MG tablet Take 650 mg by mouth 2 (two) times daily.     No current facility-administered medications on file prior to visit.    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   excised from back ([facility])    Past Surgical History:  Procedure Laterality Date   ABDOMINAL HYSTERECTOMY  1993   BASAL CELL CARCINOMA EXCISION  08/2013   back   CHOLECYSTECTOMY  2010   COLONOSCOPY W/ BIOPSIES AND POLYPECTOMY  12/2008   polyp removed   MELANOMA EXCISION Left 2011   buttock   Perinephtric abscess with peritonitis  1980   SQUAMOUS CELL CARCINOMA EXCISION  01/2014   back    Social History   Socioeconomic History   Marital status: Married    Spouse name: Not on file   Number of children: Not on file   Years of education: Not on file   Highest education level: Not on file  Occupational History   Occupation: nurse    Employer: [employer]  Tobacco Use   Smoking status: Never   Smokeless tobacco: Never  Vaping Use   Vaping Use: Never used  Substance and Sexual Activity   Alcohol use: No    Alcohol/week: 0.0 standard drinks   Drug use: No   Sexual activity: Not on file  Other Topics Concern   Not on file  Social History Narrative   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  Married, lives with spouse - 3 daughters all in [place] area   [place] to [place] from [place] in 2010   Social Determinants of Health   Financial Resource Strain: Not on [employer] Insecurity: Not on file  Transportation Needs: Not on file  Physical Activity: Not on file  Stress: Not on file  Social Connections: Not on file    Family History  Problem Relation Age of Onset   Breast cancer Mother 63   Arthritis Mother    Hyperlipidemia Mother    Heart disease Mother    Hypertension Mother    Kidney disease Mother    Diabetes Mother    Arthritis Father    Esophageal cancer Father 42   Congestive Heart Failure Sister    Breast cancer Maternal Aunt        x 2 aunt   Esophageal cancer Paternal Uncle    Colon cancer Maternal Grandmother 75    Arthritis Maternal Grandmother    Hypertension Maternal Grandmother    Kidney disease Maternal Grandmother    Breast cancer Maternal Grandmother    Diabetes Maternal Grandmother    Arthritis Other    Kidney disease Other        [place] nephritis -pt states mostly maternal side of family as the disease   Rectal cancer Neg Hx    Stomach cancer Neg Hx    Pancreatic cancer Neg Hx     Review of Systems  Constitutional:  Negative for chills and fever.  Respiratory:  Negative for cough, shortness of breath and wheezing.   Cardiovascular:  Positive for palpitations (occ - chronic). Negative for chest pain and leg swelling.  Gastrointestinal:  Negative for abdominal pain, blood in stool, constipation, diarrhea and nausea.       Rare gerd  Musculoskeletal:  Positive for back pain. Negative for arthralgias.  Neurological:  Positive for dizziness (turning to right). Negative for light-headedness and headaches.      Objective:   Vitals:   06/18/21 0939  BP: 110/80  Pulse: 79  Temp: 98.2 F (36.8 C)  SpO2: 98%   Filed Weights   06/18/21 0939  Weight: 135 lb 12.8 oz (61.6 kg)   Body mass index is 23.68 kg/m.  BP Readings from Last 3 Encounters:  06/18/21 110/80  04/13/21 119/86  02/05/21 120/80    Wt Readings from Last 3 Encounters:  06/18/21 135 lb 12.8 oz (61.6 kg)  02/05/21 137 lb (62.1 kg)  04/04/20 141 lb (64 kg)    Depression screen [facility] 2/9 04/04/2020 03/31/2019 09/28/2017 10/20/2013  Decreased Interest 0 0 0 0  Down, Depressed, Hopeless 0 0 0 0  PHQ - 2 Score 0 0 0 0    No flowsheet data found.     Physical Exam Constitutional: She appears well-developed and well-nourished. No distress.  HENT:  Head: Normocephalic and atraumatic.  Right Ear: External ear normal. Normal ear canal and TM Left Ear: External ear normal.  Normal ear canal and TM Mouth/Throat: Oropharynx is clear and moist.  Eyes: Conjunctivae and EOM are normal.  Neck: Neck supple. No tracheal  deviation present. No thyromegaly present.  No carotid bruit  Cardiovascular: Normal rate, regular rhythm and normal heart sounds.   No murmur heard.  No edema. Pulmonary/Chest: Effort normal and breath sounds normal. No respiratory distress. She has no wheezes. She has no rales.  Abdominal: Soft. She exhibits no distension. There is no tenderness.  Skin: Skin is warm and dry. She is not diaphoretic.  Psychiatric: She has a normal mood and affect. Her behavior is normal.     Lab Results  Component Value Date   WBC 6.1 04/04/2020   HGB 12.4 04/04/2020   HCT 36.8 04/04/2020   PLT 243.0 04/04/2020   GLUCOSE 98 04/04/2020   CHOL 245 (H) 04/04/2020   TRIG 163.0 (H) 04/04/2020   HDL 50.10 04/04/2020   LDLDIRECT 171.3 10/20/2013   LDLCALC 162 (H) 04/04/2020   ALT 15 04/04/2020   AST 21 04/04/2020   NA 141 04/04/2020   K 4.4 04/04/2020   CL 108 04/04/2020   CREATININE 1.73 (H) 04/04/2020   BUN 32 (H) 04/04/2020   CO2 27 04/04/2020   TSH 1.14 04/04/2020   HGBA1C 5.8 04/04/2020   MICROALBUR 61.0 (H) 03/31/2019         Assessment & Plan:    She will schedule her colonoscopy.  Reviewed recommended immunizations.   Health Maintenance  Topic Date Due   COVID-19 Vaccine (3 - Pfizer risk series) 01/16/2020   COLONOSCOPY (Pts 45-91yrs Insurance coverage will need to be confirmed)  12/13/2020   INFLUENZA VACCINE  06/02/2021   MAMMOGRAM  07/31/2022   DEXA SCAN  06/22/2024   TETANUS/TDAP  12/04/2025   Hepatitis C Screening  Completed   PNA vac Low Risk Adult  Completed   Zoster Vaccines- Shingrix  Completed   HPV VACCINES  Aged Out          See Problem List for Assessment and Plan of chronic medical problems.

## 2021-06-18 ENCOUNTER — Other Ambulatory Visit: Payer: Self-pay

## 2021-06-18 ENCOUNTER — Encounter: Payer: Self-pay | Admitting: Internal Medicine

## 2021-06-18 ENCOUNTER — Ambulatory Visit (INDEPENDENT_AMBULATORY_CARE_PROVIDER_SITE_OTHER): Payer: Medicare Other | Admitting: Internal Medicine

## 2021-06-18 VITALS — BP 110/80 | HR 79 | Temp 98.2°F | Ht 63.5 in | Wt 135.8 lb

## 2021-06-18 DIAGNOSIS — N078 Hereditary nephropathy, not elsewhere classified with other morphologic lesions: Secondary | ICD-10-CM

## 2021-06-18 DIAGNOSIS — Z0000 Encounter for general adult medical examination without abnormal findings: Secondary | ICD-10-CM

## 2021-06-18 DIAGNOSIS — N390 Urinary tract infection, site not specified: Secondary | ICD-10-CM | POA: Diagnosis not present

## 2021-06-18 DIAGNOSIS — Z85820 Personal history of malignant melanoma of skin: Secondary | ICD-10-CM

## 2021-06-18 DIAGNOSIS — R7303 Prediabetes: Secondary | ICD-10-CM

## 2021-06-18 DIAGNOSIS — E7849 Other hyperlipidemia: Secondary | ICD-10-CM

## 2021-06-18 DIAGNOSIS — I10 Essential (primary) hypertension: Secondary | ICD-10-CM | POA: Diagnosis not present

## 2021-06-18 LAB — COMPREHENSIVE METABOLIC PANEL
ALT: 15 U/L (ref 0–35)
AST: 22 U/L (ref 0–37)
Albumin: 4 g/dL (ref 3.5–5.2)
Alkaline Phosphatase: 99 U/L (ref 39–117)
BUN: 36 mg/dL — ABNORMAL HIGH (ref 6–23)
CO2: 26 mEq/L (ref 19–32)
Calcium: 9.6 mg/dL (ref 8.4–10.5)
Chloride: 106 mEq/L (ref 96–112)
Creatinine, Ser: 1.92 mg/dL — ABNORMAL HIGH (ref 0.40–1.20)
GFR: 26.39 mL/min — ABNORMAL LOW (ref >60.00)
Glucose, Bld: 88 mg/dL (ref 70–99)
Potassium: 4.3 mEq/L (ref 3.5–5.1)
Sodium: 143 mEq/L (ref 135–145)
Total Bilirubin: 0.4 mg/dL (ref 0.2–1.2)
Total Protein: 6.9 g/dL (ref 6.0–8.3)

## 2021-06-18 LAB — CBC WITH DIFFERENTIAL/PLATELET
Basophils Absolute: 0 10*3/uL (ref 0.0–0.1)
Basophils Relative: 0.6 % (ref 0.0–3.0)
Eosinophils Absolute: 0.1 10*3/uL (ref 0.0–0.7)
Eosinophils Relative: 1.6 % (ref 0.0–5.0)
HCT: 36.9 % (ref 36.0–46.0)
Hemoglobin: 12.3 g/dL (ref 12.0–15.0)
Lymphocytes Relative: 25.3 % (ref 12.0–46.0)
Lymphs Abs: 1.6 10*3/uL (ref 0.7–4.0)
MCHC: 33.4 g/dL (ref 30.0–36.0)
MCV: 85.3 fl (ref 78.0–100.0)
Monocytes Absolute: 0.5 10*3/uL (ref 0.1–1.0)
Monocytes Relative: 8.1 % (ref 3.0–12.0)
Neutro Abs: 4.1 10*3/uL (ref 1.4–7.7)
Neutrophils Relative %: 64.4 % (ref 43.0–77.0)
Platelets: 247 10*3/uL (ref 150.0–400.0)
RBC: 4.32 Mil/uL (ref 3.87–5.11)
RDW: 13.5 % (ref 11.5–15.5)
WBC: 6.4 10*3/uL (ref 4.0–10.5)

## 2021-06-18 LAB — HEMOGLOBIN A1C: Hgb A1c MFr Bld: 6 % (ref 4.6–6.5)

## 2021-06-18 LAB — LIPID PANEL
Cholesterol: 230 mg/dL — ABNORMAL HIGH (ref 0–200)
HDL: 53 mg/dL (ref >39.00)
LDL Cholesterol: 148 mg/dL — ABNORMAL HIGH (ref 0–99)
NonHDL: 176.98
Total CHOL/HDL Ratio: 4
Triglycerides: 147 mg/dL (ref 0.0–149.0)
VLDL: 29.4 mg/dL (ref 0.0–40.0)

## 2021-06-18 LAB — TSH: TSH: 1.39 u[IU]/mL (ref 0.35–5.50)

## 2021-06-18 MED ORDER — OLMESARTAN MEDOXOMIL 20 MG PO TABS: 20.0000 mg | ORAL_TABLET | Freq: Every day | ORAL | 3 refills | Status: DC

## 2021-06-18 MED ORDER — ESTRADIOL 0.1 MG/GM VA CREA: 1.0000 | TOPICAL_CREAM | VAGINAL | 11 refills | Status: DC

## 2021-06-18 NOTE — Assessment & Plan Note (Signed)
Chronic BP well controlled Continue benicar 20 mg daily Cmp, cbc, tsh

## 2021-06-18 NOTE — Assessment & Plan Note (Signed)
Chronic Check lipid panel, cmp, tsh Controlled with lifestyle Regular exercise and healthy diet encouraged

## 2021-06-18 NOTE — Assessment & Plan Note (Signed)
Chronic Has seen urology Continue estrogen cream twice weekly

## 2021-06-18 NOTE — Assessment & Plan Note (Signed)
Chronic Check a1c Low sugar / carb diet Stressed regular exercise  

## 2021-06-18 NOTE — Assessment & Plan Note (Signed)
Sees derm regularly.

## 2021-06-18 NOTE — Assessment & Plan Note (Addendum)
Chronic Following with [facility] - Dr Dava Najjar Cmp, cbc

## 2021-06-19 DIAGNOSIS — N184 Chronic kidney disease, stage 4 (severe): Secondary | ICD-10-CM | POA: Diagnosis not present

## 2021-06-24 DIAGNOSIS — D631 Anemia in chronic kidney disease: Secondary | ICD-10-CM | POA: Diagnosis not present

## 2021-06-24 DIAGNOSIS — N2581 Secondary hyperparathyroidism of renal origin: Secondary | ICD-10-CM | POA: Diagnosis not present

## 2021-06-24 DIAGNOSIS — R809 Proteinuria, unspecified: Secondary | ICD-10-CM | POA: Diagnosis not present

## 2021-06-24 DIAGNOSIS — E872 Acidosis: Secondary | ICD-10-CM | POA: Diagnosis not present

## 2021-06-24 DIAGNOSIS — N184 Chronic kidney disease, stage 4 (severe): Secondary | ICD-10-CM | POA: Diagnosis not present

## 2021-06-24 DIAGNOSIS — I129 Hypertensive chronic kidney disease with stage 1 through stage 4 chronic kidney disease, or unspecified chronic kidney disease: Secondary | ICD-10-CM | POA: Diagnosis not present

## 2021-06-27 ENCOUNTER — Ambulatory Visit (INDEPENDENT_AMBULATORY_CARE_PROVIDER_SITE_OTHER): Payer: Medicare Other

## 2021-06-27 ENCOUNTER — Other Ambulatory Visit: Payer: Self-pay

## 2021-06-27 VITALS — BP 110/80 | Ht 64.0 in | Wt 136.0 lb

## 2021-06-27 DIAGNOSIS — Z0000 Encounter for general adult medical examination without abnormal findings: Secondary | ICD-10-CM

## 2021-06-27 NOTE — Progress Notes (Signed)
Subjective:   Sarah Valentine is a 70 y.o. female who presents for Medicare Annual (Subsequent) preventive examination.  Review of Systems     Cardiac Risk Factors include: advanced age (>81men, >78 women);dyslipidemia;hypertension;family history of premature cardiovascular disease     Objective:    Today's Vitals   06/27/21 1003  Weight: 136 lb (61.7 kg)  Height: 5\' 4"  (1.626 m)  PainSc: 0-No pain   Body mass index is 23.34 kg/m.  Advanced Directives 06/27/2021 05/10/2019 07/30/2014  Does Patient Have a Medical Advance Directive? Yes Yes Yes  Type of Advance Directive Living will;Healthcare Power of Attorney - Living will;Healthcare Power of Attorney  Does patient want to make changes to medical advance directive? No - Patient declined No - Patient declined -  Copy of [place] in Chart? No - copy requested - -    Current Medications (verified) Outpatient Encounter Medications as of 06/27/2021  Medication Sig   Cholecalciferol (VITAMIN D3) 50 MCG (2000 UT) capsule    Cranberry 1000 MG CAPS Take 500 mg by mouth daily.    estradiol (ESTRACE) 0.1 MG/GM vaginal cream Place 1 Applicatorful vaginally 2 (two) times a week.   olmesartan (BENICAR) 20 MG tablet Take 1 tablet (20 mg total) by mouth daily.   sodium bicarbonate 650 MG tablet Take 650 mg by mouth 2 (two) times daily.   No facility-administered encounter medications on file as of 06/27/2021.    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[place]) 2011   Melanoma in situ (L) buttock, dr Delman Cheadle   Migraines    Nephritis, hereditary    familial chronic nephritis   Squamous cell carcinoma in  situ of skin 01/2014   excised from back ([facility])   Past Surgical History:  Procedure Laterality Date   ABDOMINAL HYSTERECTOMY  1993   BASAL CELL CARCINOMA EXCISION  08/2013   back   CHOLECYSTECTOMY  2010   COLONOSCOPY W/ BIOPSIES AND POLYPECTOMY  12/2008   polyp removed   MELANOMA EXCISION Left 2011   buttock   Perinephtric abscess with peritonitis  1980   SQUAMOUS CELL CARCINOMA EXCISION  01/2014   back   Family History  Problem Relation Age of Onset   Breast cancer Mother 36   Arthritis Mother    Hyperlipidemia Mother    Heart disease Mother    Hypertension Mother    Kidney disease Mother    Diabetes Mother    Arthritis Father    Esophageal cancer Father 74   Congestive Heart Failure Sister    Breast cancer Maternal Aunt        x 2 aunt   Esophageal cancer Paternal Uncle    Colon cancer Maternal Grandmother 66   Arthritis Maternal Grandmother    Hypertension Maternal Grandmother    Kidney disease Maternal Grandmother    Breast cancer Maternal Grandmother    Diabetes Maternal Grandmother    Arthritis Other    Kidney disease Other        [place] nephritis -pt states mostly maternal side of family as the disease   Rectal cancer Neg Hx    Stomach cancer Neg Hx  Pancreatic cancer Neg Hx    Social History   Socioeconomic History   Marital status: Married    Spouse name: Not on file   Number of children: Not on file   Years of education: Not on file   Highest education level: Not on file  Occupational History   Occupation: nurse    Employer: [employer]  Tobacco Use   Smoking status: Never   Smokeless tobacco: Never  Vaping Use   Vaping Use: Never used  Substance and Sexual Activity   Alcohol use: No    Alcohol/week: 0.0 standard drinks   Drug use: No   Sexual activity: Not on file  Other Topics Concern   Not on file  Social History Narrative   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   Married, lives with spouse - 3 daughters all in [place] area   [place] to [place] from [place] in 2010   Social Determinants of Health   Financial Resource Strain: Low Risk    Difficulty of Paying Living Expenses: Not hard at all  Food Insecurity: No Food Insecurity   Worried About Charity fundraiser in the Last Year: Never true   Arboriculturist in the Last Year: Never true  Transportation Needs: No Transportation Needs   Lack of Transportation (Medical): No   Lack of Transportation (Non-Medical): No  Physical Activity: Sufficiently Active   Days of Exercise per Week: 5 days   Minutes of Exercise per Session: 30 min  Stress: No Stress Concern Present   Feeling of Stress : Not at all  Social Connections: Unknown   Frequency of Communication with Friends and Family: More than three times a week   Frequency of Social Gatherings with Friends and Family: More than three times a week   Attends Religious Services: Patient refused   Marine scientist or Organizations: Patient refused   Attends Music therapist: Patient refused   Marital Status: Married    Tobacco Counseling Counseling given: Not Answered   Clinical Intake:  Pre-visit preparation completed: Yes  Pain : No/denies pain Pain Score: 0-No pain     BMI - recorded: 23.34 Nutritional Status: BMI of 19-24  Normal Nutritional Risks: None Diabetes: No  How often do you need to have someone help you when you read instructions, pamphlets, or other written materials from your doctor or pharmacy?: 1 - Never What is the last grade level you completed in school?: Associate's Degree  Diabetic? no  Interpreter Needed?: No  Information entered by :: Lisette Abu, LPN   Activities of Daily Living In your present state of health, do you have any difficulty performing the following activities: 06/27/2021  Hearing? N  Vision? N  Difficulty concentrating or making  decisions? N  Walking or climbing stairs? N  Dressing or bathing? N  Doing errands, shopping? N  Preparing Food and eating ? N  Using the Toilet? N  In the past six months, have you accidently leaked urine? N  Do you have problems with loss of bowel control? N  Managing your Medications? N  Managing your Finances? N  Housekeeping or managing your Housekeeping? N  Some recent data might be hidden    Patient Care Team: Binnie Rail, MD as PCP - General (Internal Medicine) Milus Banister, MD (Gastroenterology) Jerrell Belfast, MD (Otolaryngology) Crissie Reese, MD (Plastic Surgery) [employer],  Santiago Glad, MD (Dermatology) Neldon Mc, MD (General Surgery)  Indicate any recent Medical Services you may have received from other than [facility] providers in the past year (date may be approximate).     Assessment:   This is a routine wellness examination for Lakiyah.  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  No hearing aids. Vision Screening - Comments:: Patient wears glasses.  Dietary issues and exercise activities discussed: Current Exercise Habits: Home exercise routine, Type of exercise: walking (walks 1.5 miles twice a day), Time (Minutes): 30, Frequency (Times/Week): 5, Weekly Exercise (Minutes/Week): 150, Exercise limited by: None identified   Goals Addressed   None   Depression Screen PHQ 2/9 Scores 06/27/2021 04/04/2020 03/31/2019 09/28/2017 10/20/2013  PHQ - 2 Score 0 0 0 0 0    Fall Risk Fall Risk  06/18/2021 04/04/2020 03/31/2019 09/28/2017 10/20/2013  Falls in the past year? 0 0 0 No No  Number falls in past yr: 0 0 0 - -  Injury with Fall? 0 0 - - -  Risk for fall due to : No Fall Risks - - - -  Follow up Falls evaluation completed Falls evaluation completed - - -    FALL RISK PREVENTION PERTAINING TO THE HOME:  Any stairs in or around the home? No  If so, are there any without handrails? No   Home free of loose throw rugs in walkways, pet beds, electrical cords, etc? Yes  Adequate lighting in your home to reduce risk of falls? Yes   ASSISTIVE DEVICES UTILIZED TO PREVENT FALLS:  Life alert? No  Use of a cane, walker or w/c? No  Grab bars in the bathroom? No  Shower chair or bench in shower? Yes  Elevated toilet seat or a handicapped toilet? Yes   TIMED UP AND GO:  Was the test performed? Yes .  Length of time to ambulate 10 feet: 5 sec.   Gait steady and fast without use of assistive device  Cognitive Function: MMSE - Mini Mental State Exam 03/31/2019  Orientation to time 5  Orientation to Place 5  Registration 3  Attention/ Calculation 5  Recall 3  Language- name 2 objects 2  Language- repeat 1  Language- follow 3 step command 3  Language- read & follow direction 1  Write a sentence 1  Copy design 1  Total score 30        Immunizations Immunization History  Administered Date(s) Administered   Influenza Split 09/28/2011   Influenza, High Dose Seasonal PF 08/11/2017, 09/09/2018   Influenza, Seasonal, Injecte, Preservative Fre 10/21/2012   Influenza,inj,Quad PF,6+ Mos 08/31/2013, 08/24/2014   Influenza-Unspecified 08/03/2015, 09/08/2016, 08/11/2017   PFIZER(Purple Top)SARS-COV-2 Vaccination 11/28/2019, 12/19/2019   Pneumococcal Conjugate-13 11/14/2014   Pneumococcal Polysaccharide-23 03/31/2019   Td 11/02/2005   Tdap 12/05/2015   Zoster Recombinat (Shingrix) 06/01/2018, 08/05/2018   Zoster, Live 10/21/2012    TDAP status: Up to date  Flu Vaccine status: Up to date  Pneumococcal vaccine status: Up to date  Covid-19 vaccine status: Completed vaccines  Qualifies for Shingles Vaccine? Yes   Zostavax completed Yes   Shingrix Completed?: Yes  Screening Tests Health Maintenance  Topic Date Due   COVID-19 Vaccine (3 - Pfizer risk series) 01/16/2020   COLONOSCOPY (Pts 45-48yrs Insurance coverage will need to be confirmed)  12/13/2020   INFLUENZA  VACCINE  06/02/2021   MAMMOGRAM  07/31/2022   DEXA SCAN  06/22/2024   TETANUS/TDAP  12/04/2025  Hepatitis C Screening  Completed   PNA vac Low Risk Adult  Completed   Zoster Vaccines- Shingrix  Completed   HPV VACCINES  Aged Out    Health Maintenance  Health Maintenance Due  Topic Date Due   COVID-19 Vaccine (3 - Pfizer risk series) 01/16/2020   COLONOSCOPY (Pts 45-68yrs Insurance coverage will need to be confirmed)  12/13/2020   INFLUENZA VACCINE  06/02/2021    Colorectal cancer screening: Type of screening: Colonoscopy. Completed 12/13/2017. Repeat every 3 years  Mammogram status: Completed 07/31/2020. Repeat every year (Scheduled for 08/04/2021)  Bone Density status: Completed 06/23/2019. Results reflect: Bone density results: NORMAL. Repeat every 5 years.  Lung Cancer Screening: (Low Dose CT Chest recommended if Age 38-80 years, 30 pack-year currently smoking OR have quit w/in 15years.) does not qualify.   Lung Cancer Screening Referral: no  Additional Screening:  Hepatitis C Screening: does qualify; Completed yes  Vision Screening: Recommended annual ophthalmology exams for early detection of glaucoma and other disorders of the eye. Is the patient up to date with their annual eye exam?  Yes  Who is the provider or what is the name of the office in which the patient attends annual eye exams? [facility] If pt is not established with a provider, would they like to be referred to a provider to establish care? No .   Dental Screening: Recommended annual dental exams for proper oral hygiene  Community Resource Referral / Chronic Care Management: CRR required this visit?  No   CCM required this visit?  No      Plan:     I have personally reviewed and noted the following in the patient's chart:   Medical and social history Use of alcohol, tobacco or illicit drugs  Current medications and supplements including opioid prescriptions.  Functional ability and  status Nutritional status Physical activity Advanced directives List of other physicians Hospitalizations, surgeries, and ER visits in previous 12 months Vitals Screenings to include cognitive, depression, and falls Referrals and appointments  In addition, I have reviewed and discussed with patient certain preventive protocols, quality metrics, and best practice recommendations. A written personalized care plan for preventive services as well as general preventive health recommendations were provided to patient.     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  No hearing aids. Vision Screening - Comments:: Patient wears glasses.

## 2021-06-27 NOTE — Patient Instructions (Signed)
Sarah Valentine , Thank you for taking time to come for your Medicare Wellness Visit. I appreciate your ongoing commitment to your health goals. Please review the following plan we discussed and let me know if I can assist you in the future.   Screening recommendations/referrals: Colonoscopy: 12/13/2017; due every 3 years Mammogram: 07/31/2020; due every 1-2 years Bone Density: 06/23/2019; due every 5 years Recommended yearly ophthalmology/optometry visit for glaucoma screening and checkup Recommended yearly dental visit for hygiene and checkup  Vaccinations: Influenza vaccine: 08/20/2020 Pneumococcal vaccine: 11/14/2014, 03/31/2019 Tdap vaccine: 12/05/2015; due every 10 years Shingles vaccine: 05/14/2018, 08/05/2018   Covid-19: 11/28/2019, 12/19/2019, 08/20/2020, 03/10/2021  Advanced directives: Please bring a copy of your health care power of attorney and living will to the office at your convenience.  Conditions/risks identified: Yes; Please schedule your next Medicare Wellness Visit with your Nurse Health Advisor in 1 year by calling [phone].  Next appointment: Client understands the importance of follow-up with providers by attending scheduled visits and discussed goals to eat healthier, increase physical activity, exercise the brain, socialize more, get enough sleep and make time for laughter.   Preventive Care 45 Years and Older, Female Preventive care refers to lifestyle choices and visits with your health care provider that can promote health and wellness. What does preventive care include? A yearly physical exam. This is also called an annual well check. Dental exams once or twice a year. Routine eye exams. Ask your health care provider how often you should have your eyes checked. Personal lifestyle choices, including: Daily care of your teeth and gums. Regular physical activity. Eating a healthy diet. Avoiding tobacco and drug use. Limiting alcohol use. Practicing safe sex. Taking  low-dose aspirin every day. Taking vitamin and mineral supplements as recommended by your health care provider. What happens during an annual well check? The services and screenings done by your health care provider during your annual well check will depend on your age, overall health, lifestyle risk factors, and family history of disease. Counseling  Your health care provider may ask you questions about your: Alcohol use. Tobacco use. Drug use. Emotional well-being. Home and relationship well-being. Sexual activity. Eating habits. History of falls. Memory and ability to understand (cognition). Work and work Statistician. Reproductive health. Screening  You may have the following tests or measurements: Height, weight, and BMI. Blood pressure. Lipid and cholesterol levels. These may be checked every 5 years, or more frequently if you are over 10 years old. Skin check. Lung cancer screening. You may have this screening every year starting at age 61 if you have a 30-pack-year history of smoking and currently smoke or have quit within the past 15 years. Fecal occult blood test (FOBT) of the stool. You may have this test every year starting at age 69. Flexible sigmoidoscopy or colonoscopy. You may have a sigmoidoscopy every 5 years or a colonoscopy every 10 years starting at age 51. Hepatitis C blood test. Hepatitis B blood test. Sexually transmitted disease (STD) testing. Diabetes screening. This is done by checking your blood sugar (glucose) after you have not eaten for a while (fasting). You may have this done every 1-3 years. Bone density scan. This is done to screen for osteoporosis. You may have this done starting at age 75. Mammogram. This may be done every 1-2 years. Talk to your health care provider about how often you should have regular mammograms. Talk with your health care provider about your test results, treatment options, and if necessary, the need for  more tests. Vaccines   Your health care provider may recommend certain vaccines, such as: Influenza vaccine. This is recommended every year. Tetanus, diphtheria, and acellular pertussis (Tdap, Td) vaccine. You may need a Td booster every 10 years. Zoster vaccine. You may need this after age 71. Pneumococcal 13-valent conjugate (PCV13) vaccine. One dose is recommended after age 2. Pneumococcal polysaccharide (PPSV23) vaccine. One dose is recommended after age 65. Talk to your health care provider about which screenings and vaccines you need and how often you need them. This information is not intended to replace advice given to you by your health care provider. Make sure you discuss any questions you have with your health care provider. Document Released: 11/15/2015 Document Revised: 07/08/2016 Document Reviewed: 08/20/2015 Elsevier Interactive Patient Education  2017 [place] Prevention in the Home Falls can cause injuries. They can happen to people of all ages. There are many things you can do to make your home safe and to help prevent falls. What can I do on the outside of my home? Regularly fix the edges of walkways and driveways and fix any cracks. Remove anything that might make you trip as you walk through a door, such as a raised step or threshold. Trim any bushes or trees on the path to your home. Use bright outdoor lighting. Clear any walking paths of anything that might make someone trip, such as rocks or tools. Regularly check to see if handrails are loose or broken. Make sure that both sides of any steps have handrails. Any raised decks and porches should have guardrails on the edges. Have any leaves, snow, or ice cleared regularly. Use sand or salt on walking paths during winter. Clean up any spills in your garage right away. This includes oil or grease spills. What can I do in the bathroom? Use night lights. Install grab bars by the toilet and in the tub and shower. Do not use towel  bars as grab bars. Use non-skid mats or decals in the tub or shower. If you need to sit down in the shower, use a plastic, non-slip stool. Keep the floor dry. Clean up any water that spills on the floor as soon as it happens. Remove soap buildup in the tub or shower regularly. Attach bath mats securely with double-sided non-slip rug tape. Do not have throw rugs and other things on the floor that can make you trip. What can I do in the bedroom? Use night lights. Make sure that you have a light by your bed that is easy to reach. Do not use any sheets or blankets that are too big for your bed. They should not hang down onto the floor. Have a firm chair that has side arms. You can use this for support while you get dressed. Do not have throw rugs and other things on the floor that can make you trip. What can I do in the kitchen? Clean up any spills right away. Avoid walking on wet floors. Keep items that you use a lot in easy-to-reach places. If you need to reach something above you, use a strong step stool that has a grab bar. Keep electrical cords out of the way. Do not use floor polish or wax that makes floors slippery. If you must use wax, use non-skid floor wax. Do not have throw rugs and other things on the floor that can make you trip. What can I do with my stairs? Do not leave any items on the stairs. Make  sure that there are handrails on both sides of the stairs and use them. Fix handrails that are broken or loose. Make sure that handrails are as long as the stairways. Check any carpeting to make sure that it is firmly attached to the stairs. Fix any carpet that is loose or worn. Avoid having throw rugs at the top or bottom of the stairs. If you do have throw rugs, attach them to the floor with carpet tape. Make sure that you have a light switch at the top of the stairs and the bottom of the stairs. If you do not have them, ask someone to add them for you. What else can I do to help  prevent falls? Wear shoes that: Do not have high heels. Have rubber bottoms. Are comfortable and fit you well. Are closed at the toe. Do not wear sandals. If you use a stepladder: Make sure that it is fully opened. Do not climb a closed stepladder. Make sure that both sides of the stepladder are locked into place. Ask someone to hold it for you, if possible. Clearly mark and make sure that you can see: Any grab bars or handrails. First and last steps. Where the edge of each step is. Use tools that help you move around (mobility aids) if they are needed. These include: Canes. Walkers. Scooters. Crutches. Turn on the lights when you go into a dark area. Replace any light bulbs as soon as they burn out. Set up your furniture so you have a clear path. Avoid moving your furniture around. If any of your floors are uneven, fix them. If there are any pets around you, be aware of where they are. Review your medicines with your doctor. Some medicines can make you feel dizzy. This can increase your chance of falling. Ask your doctor what other things that you can do to help prevent falls. This information is not intended to replace advice given to you by your health care provider. Make sure you discuss any questions you have with your health care provider. Document Released: 08/15/2009 Document Revised: 03/26/2016 Document Reviewed: 11/23/2014 Elsevier Interactive Patient Education  2017 [employer].

## 2021-07-23 DIAGNOSIS — R3121 Asymptomatic microscopic hematuria: Secondary | ICD-10-CM | POA: Diagnosis not present

## 2021-07-23 DIAGNOSIS — N952 Postmenopausal atrophic vaginitis: Secondary | ICD-10-CM | POA: Diagnosis not present

## 2021-07-25 ENCOUNTER — Encounter: Payer: Self-pay | Admitting: Gastroenterology

## 2021-08-04 ENCOUNTER — Other Ambulatory Visit: Payer: Self-pay

## 2021-08-04 ENCOUNTER — Ambulatory Visit
Admission: RE | Admit: 2021-08-04 | Discharge: 2021-08-04 | Disposition: A | Discharge status: Home / Self Care | Payer: Medicare Other | Source: Ambulatory Visit

## 2021-08-04 DIAGNOSIS — Z1231 Encounter for screening mammogram for malignant neoplasm of breast: Secondary | ICD-10-CM

## 2021-09-19 ENCOUNTER — Other Ambulatory Visit: Payer: Self-pay

## 2021-09-19 ENCOUNTER — Ambulatory Visit (AMBULATORY_SURGERY_CENTER): Payer: Medicare Other

## 2021-09-19 VITALS — Ht 63.5 in | Wt 135.0 lb

## 2021-09-19 DIAGNOSIS — Z86010 Personal history of colonic polyps: Secondary | ICD-10-CM

## 2021-09-19 MED ORDER — PLENVU 140 G PO SOLR: 1.0000 | ORAL | 0 refills | Status: DC

## 2021-09-19 NOTE — Progress Notes (Signed)
Pre visit completed via phone call; patient verified name, DOB, and address; No egg or soy allergy known to patient  No issues known to pt with past sedation with any surgeries or procedures Patient denies ever being told they had issues or difficulty with intubation  No FH of Malignant Hyperthermia Pt is not on diet pills Pt is not on home 02  Pt is not on blood thinners  Pt denies issues with constipation at this time; No A fib or A flutter Pt is fully vaccinated for Covid x 2; Medicaid coupon given to pt in PV today and NO PA's for preps discussed with pt in [facility] today  Discussed with pt there will be an out-of-pocket cost for prep and that varies from $0 to 70 +  dollars - pt verbalized understanding  Due to the COVID-19 pandemic we are asking patients to follow certain guidelines in PV and the [place]   Pt aware of COVID protocols and [facility] guidelines

## 2021-09-27 DIAGNOSIS — Z23 Encounter for immunization: Secondary | ICD-10-CM | POA: Diagnosis not present

## 2021-10-03 ENCOUNTER — Ambulatory Visit (AMBULATORY_SURGERY_CENTER): Payer: Medicare Other | Admitting: Gastroenterology

## 2021-10-03 ENCOUNTER — Other Ambulatory Visit: Payer: Self-pay

## 2021-10-03 ENCOUNTER — Encounter: Payer: Self-pay | Admitting: Gastroenterology

## 2021-10-03 VITALS — BP 113/92 | HR 78 | Temp 97.1°F | Resp 15 | Ht 63.5 in | Wt 135.0 lb

## 2021-10-03 DIAGNOSIS — K635 Polyp of colon: Secondary | ICD-10-CM

## 2021-10-03 DIAGNOSIS — Z86010 Personal history of colonic polyps: Secondary | ICD-10-CM

## 2021-10-03 DIAGNOSIS — D125 Benign neoplasm of sigmoid colon: Secondary | ICD-10-CM | POA: Diagnosis not present

## 2021-10-03 DIAGNOSIS — D124 Benign neoplasm of descending colon: Secondary | ICD-10-CM

## 2021-10-03 MED ORDER — SODIUM CHLORIDE 0.9 % IV SOLN: 500.0000 mL | Freq: Once | INTRAVENOUS | Status: DC

## 2021-10-03 NOTE — Progress Notes (Signed)
Called to room to assist during endoscopic procedure.  Patient ID and intended procedure confirmed with present staff. Received instructions for my participation in the procedure from the performing physician.  

## 2021-10-03 NOTE — Progress Notes (Signed)
Vitals-CW  Pt's states no medical or surgical changes since previsit or office visit. 

## 2021-10-03 NOTE — Progress Notes (Signed)
To PACU, VSS. Report to [email or web address] 

## 2021-10-03 NOTE — Progress Notes (Signed)
Colonoscopy 2010 in [place] found 2m adenomatous polyp. Colonoscopy 2015 Dr. JArdis Hughsfound 5 subCM polyps (two were TAs, one was SSP.  Colonoscopy February 2019 5 subcentimeter polyps were removed, one was hyperplastic and the rest were mixed adenomas and SSP's.  HPI: This is a woman with h/o precancerous polyps   ROS: complete GI ROS as described in HPI, all other review negative.  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   [facility] ([facility]    Past Surgical History:  Procedure Laterality Date   ABDOMINAL HYSTERECTOMY  11/03/1991   BACK SURGERY  2020   and in 2021 due to herniated disc   BASAL CELL CARCINOMA EXCISION  08/02/2013   back   CHOLECYSTECTOMY  11/02/2008   COLONOSCOPY  2019   DJ-MAC-suprep(good)-tics/polyps x 5 (TA/SSP)   COLONOSCOPY W/ BIOPSIES AND POLYPECTOMY  12/03/2008   polyp removed   MELANOMA EXCISION Left 11/02/2009   buttock   Perinephtric abscess with peritonitis  11/02/1978   POLYPECTOMY  2019   TA and SSP   SQUAMOUS CELL CARCINOMA EXCISION  01/31/2014   back    Current Outpatient Medications  Medication Sig Dispense Refill   Cholecalciferol (VITAMIN D3) 50 MCG (2000 UT) capsule Take 2,000 Units by mouth daily.     Cranberry 1000 MG CAPS Take 500 mg by mouth daily.      estradiol (ESTRACE) 0.1 MG/GM vaginal cream Place 1 Applicatorful vaginally 2 (two) times a week. 42.5 g 11   olmesartan (BENICAR) 20 MG  tablet Take 1 tablet (20 mg total) by mouth daily. 90 tablet 3   sodium bicarbonate 650 MG tablet Take 650 mg by mouth 2 (two) times daily.     Current Facility-Administered Medications  Medication Dose Route Frequency Provider Last Rate Last Admin   0.9 %  sodium chloride infusion  500 mL Intravenous Once JMilus Banister MD        Allergies as of 10/03/2021 - Review Complete 10/03/2021  Allergen Reaction Noted   Sulfonamide derivatives Itching and Rash     Family History  Problem Relation Age of Onset   Breast cancer Mother 799  Arthritis Mother    Hyperlipidemia Mother    Heart disease Mother    Hypertension Mother    Kidney disease Mother    Diabetes Mother    Arthritis Father    Esophageal cancer Father 856  Congestive Heart Failure Sister    Breast cancer Maternal Aunt        752s  Breast cancer Maternal Aunt        70s   Esophageal cancer Paternal Uncle 950  Colon cancer Maternal Grandmother 78   Colon polyps Maternal Grandmother 78   Breast cancer Maternal Grandmother 72   Arthritis Maternal Grandmother    Hypertension Maternal Grandmother    Kidney disease Maternal Grandmother  Diabetes Maternal Grandmother    Breast cancer Cousin 13   Breast cancer Cousin        76s   Arthritis Other    Kidney disease Other        Lport nephritis -pt states mostly maternal side of family as the disease   Rectal cancer Neg Hx    Stomach cancer Neg Hx    Pancreatic cancer Neg Hx     Social History   Socioeconomic History   Marital status: Married    Spouse name: Not on file   Number of children: Not on file   Years of education: Not on file   Highest education level: Not on file  Occupational History   Occupation: nurse    Employer: [employer]  Tobacco Use   Smoking status: Never   Smokeless tobacco: Never  Vaping Use   Vaping Use: Never used  Substance and Sexual Activity   Alcohol use: No    Alcohol/week: 0.0 standard drinks   Drug use: No    Sexual activity: Not on file  Other Topics Concern   Not on file  Social History Narrative   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   Married, lives with spouse - 3 daughters all in [place] area   [place] to [place] from [place] in 2010   Social Determinants of Health   Financial Resource Strain: Low Risk    Difficulty of Paying Living Expenses: Not hard at all  Food Insecurity: No Food Insecurity   Worried About Charity fundraiser in the Last Year: Never true   Arboriculturist in the Last Year: Never true  Transportation Needs: No Transportation Needs   Lack of Transportation (Medical): No   Lack of Transportation (Non-Medical): No  Physical Activity: Sufficiently Active   Days of Exercise per Week: 5 days   Minutes of Exercise per Session: 30 min  Stress: No Stress Concern Present   Feeling of Stress : Not at all  Social Connections: Unknown   Frequency of Communication with Friends and Family: More than three times a week   Frequency of Social Gatherings with Friends and Family: More than three times a week   Attends Religious Services: Patient refused   Marine scientist or Organizations: Patient refused   Attends Music therapist: Patient refused   Marital Status: Married  Human resources officer Violence: Not At Risk   Fear of Current or Ex-Partner: No   Emotionally Abused: No   Physically Abused: No   Sexually Abused: No     Physical Exam: BP 111/83 (BP Location: Right Arm, Patient Position: Sitting, Cuff Size: Normal)   Pulse 75   Temp (!) 97.1 F (36.2 C) (Temporal)   Ht 5' 3.5" (1.613 m)   Wt 135 lb (61.2 kg)   SpO2 99%   BMI 23.54 kg/m  Constitutional: generally well-appearing Psychiatric: alert and oriented x3 Lungs: CTA bilaterally Heart: no MCR  Assessment and plan: 70 y.o. female with h.o precancerous polpys  Surveillance colonoscoy today  Care  is appropriate for the ambulatory setting.  Owens Loffler, MD [facility] 10/03/2021, 7:49 AM

## 2021-10-03 NOTE — Patient Instructions (Signed)
Handouts provided on polyps and diverticulosis.  ? ?YOU HAD AN ENDOSCOPIC PROCEDURE TODAY AT THE [facility]:   Refer to the procedure report that was given to you for any specific questions about what was found during the examination.  If the procedure report does not answer your questions, please call your gastroenterologist to clarify.  If you requested that your care partner not be given the details of your procedure findings, then the procedure report has been included in a sealed envelope for you to review at your convenience later. ? ?YOU SHOULD EXPECT: Some feelings of bloating in the abdomen. Passage of more gas than usual.  Walking can help get rid of the air that was put into your GI tract during the procedure and reduce the bloating. If you had a lower endoscopy (such as a colonoscopy or flexible sigmoidoscopy) you may notice spotting of blood in your stool or on the toilet paper. If you underwent a bowel prep for your procedure, you may not have a normal bowel movement for a few days. ? ?Please Note:  You might notice some irritation and congestion in your nose or some drainage.  This is from the oxygen used during your procedure.  There is no need for concern and it should clear up in a day or so. ? ?SYMPTOMS TO REPORT IMMEDIATELY: ? ?Following lower endoscopy (colonoscopy or flexible sigmoidoscopy): ? Excessive amounts of blood in the stool ? Significant tenderness or worsening of abdominal pains ? Swelling of the abdomen that is new, acute ? Fever of 100?F or higher ? ?For urgent or emergent issues, a gastroenterologist can be reached at any hour by calling [phone]. ?Do not use MyChart messaging for urgent concerns.  ? ? ?DIET:  We do recommend a small meal at first, but then you may proceed to your regular diet.  Drink plenty of fluids but you should avoid alcoholic beverages for 24 hours. ? ?ACTIVITY:  You should plan to take it easy for the rest of today and you should NOT  DRIVE or use heavy machinery until tomorrow (because of the sedation medicines used during the test).   ? ?FOLLOW UP: ?Our staff will call the number listed on your records 48-72 hours following your procedure to check on you and address any questions or concerns that you may have regarding the information given to you following your procedure. If we do not reach you, we will leave a message.  We will attempt to reach you two times.  During this call, we will ask if you have developed any symptoms of COVID 19. If you develop any symptoms (ie: fever, flu-like symptoms, shortness of breath, cough etc.) before then, please call [phone].  If you test positive for Covid 19 in the 2 weeks post procedure, please call and report this information to us.   ? ?If any biopsies were taken you will be contacted by phone or by letter within the next 1-3 weeks.  Please call us at [phone] if you have not heard about the biopsies in 3 weeks.  ? ? ?SIGNATURES/CONFIDENTIALITY: ?You and/or your care partner have signed paperwork which will be entered into your electronic medical record.  These signatures attest to the fact that that the information above on your After Visit Summary has been reviewed and is understood.  Full responsibility of the confidentiality of this discharge information lies with you and/or your care-partner. ? ?

## 2021-10-03 NOTE — Op Note (Signed)
[facility] Patient Name: Sarah Valentine Procedure Date: 10/03/2021 8:16 AM MRN: [id number] Endoscopist: Milus Banister , MD Age: 70 Referring MD:  Date of Birth: 1951/05/16 Gender: Female Account #: [id number] Procedure:                Colonoscopy Indications:              High risk colon cancer surveillance: Personal                            history of colonic polyps; Colonoscopy 2010 in                            [place] found 64mm adenomatous polyp. Colonoscopy                            2015 Dr. Ardis Hughs found 5 subCM polyps (two were TAs,                            one was SSP. Colonoscopy February 2019 5                            subcentimeter polyps were removed, one was                            hyperplastic and the rest were mixed adenomas and                            SSP's Medicines:                Monitored Anesthesia Care Procedure:                Pre-Anesthesia Assessment:                           - Prior to the procedure, a History and Physical                            was performed, and patient medications and                            allergies were reviewed. The patient's tolerance of                            previous anesthesia was also reviewed. The risks                            and benefits of the procedure and the sedation                            options and risks were discussed with the patient.                            All questions were answered, and informed consent  was obtained. Prior Anticoagulants: The patient has                            taken no previous anticoagulant or antiplatelet                            agents. ASA Grade Assessment: II - A patient with                            mild systemic disease. After reviewing the risks                            and benefits, the patient was deemed in                            satisfactory condition to undergo the procedure.                            After obtaining informed consent, the colonoscope                            was passed under direct vision. Throughout the                            procedure, the patient's blood pressure, pulse, and                            oxygen saturations were monitored continuously. The                            CF [id number] #[id number] was introduced through the anus                            and advanced to the the cecum, identified by                            appendiceal orifice and ileocecal valve. The                            colonoscopy was performed without difficulty. The                            patient tolerated the procedure well. The quality                            of the bowel preparation was good. The ileocecal                            valve, appendiceal orifice, and rectum were                            photographed. Scope In: 8:27:59 AM Scope Out: 8:39:08 AM Scope Withdrawal Time: 0 hours 6 minutes 44 seconds  Total Procedure Duration: 0 hours  11 minutes 9 seconds  Findings:                 Two sessile polyps were found in the sigmoid colon                            and descending colon. The polyps were 2 to 4 mm in                            size. These polyps were removed with a cold snare.                            Resection and retrieval were complete.                           Multiple small and large-mouthed diverticula were                            found in the entire colon.                           The exam was otherwise without abnormality on                            direct and retroflexion views. Complications:            No immediate complications. Estimated blood loss:                            None. Estimated Blood Loss:     Estimated blood loss: none. Impression:               - Two 2 to 4 mm polyps in the sigmoid colon and in                            the descending colon, removed with a cold snare.                            Resected and  retrieved.                           - Diverticulosis in the entire examined colon.                           - The examination was otherwise normal on direct                            and retroflexion views. Recommendation:           - Patient has a contact number available for                            emergencies. The signs and symptoms of potential                            delayed complications were discussed with the  patient. Return to normal activities tomorrow.                            Written discharge instructions were provided to the                            patient.                           - Resume previous diet.                           - Continue present medications.                           - Await pathology results. Milus Banister, MD 10/03/2021 8:41:30 AM This report has been signed electronically.

## 2021-10-05 NOTE — Progress Notes (Signed)
Subjective:    Patient ID: Sarah Valentine, female    DOB: 06/27/1951, 70 y.o.   MRN: [id number]  This visit occurred during the SARS-CoV-2 public health emergency.  Safety protocols were in place, including screening questions prior to the visit, additional usage of staff PPE, and extensive cleaning of exam room while observing appropriate contact time as indicated for disinfecting solutions.    HPI The patient is here for an acute visit.   RUQ discomfort - started 2 weeks, constant.  It feels now under the rib cage.  Nothing makes it better or worse.  Not related to eating or position. Has not needed to take anything for it.    Once in a while she will have increased pain with certain movements.   She just had a colonoscopy and that was good.  S/p cholecystectomy and hysterectomy.    Medications and allergies reviewed with patient and updated if appropriate.  Patient Active Problem List   Diagnosis Date Noted   Lumbar radiculopathy 05/09/2019   Frequent UTI 08/29/2018   Prediabetes 10/07/2016   Lung nodule, solitary - benign, no f/u 10/07/2016   Family history of breast cancer 11/25/2015   Nephritis, hereditary 11/14/2014   Incomplete RBBB    Hypertension    Hyperlipidemia    History of melanoma    Migraines    BARRETTS ESOPHAGUS 09/05/2010   PERSONAL HISTORY OF COLONIC POLYPS 09/05/2010    Current Outpatient Medications on File Prior to Visit  Medication Sig Dispense Refill   Cholecalciferol (VITAMIN D3) 50 MCG (2000 UT) capsule Take 2,000 Units by mouth daily.     Cranberry 1000 MG CAPS Take 500 mg by mouth daily.      estradiol (ESTRACE) 0.1 MG/GM vaginal cream Place 1 Applicatorful vaginally 2 (two) times a week. 42.5 g 11   olmesartan (BENICAR) 20 MG tablet Take 1 tablet (20 mg total) by mouth daily. 90 tablet 3   sodium bicarbonate 650 MG tablet Take 650 mg by mouth 2 (two) times daily.     No current facility-administered medications on file prior to  visit.    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   excised from back Delman Cheadle)    Past Surgical History:  Procedure Laterality Date   ABDOMINAL HYSTERECTOMY  11/03/1991   BACK SURGERY  2020   and in 2021 due to herniated disc   BASAL CELL CARCINOMA EXCISION  08/02/2013   back   CHOLECYSTECTOMY  11/02/2008   COLONOSCOPY  2019   DJ-MAC-suprep(good)-tics/polyps x 5 (TA/SSP)   COLONOSCOPY W/ BIOPSIES AND POLYPECTOMY  12/03/2008   polyp removed   MELANOMA EXCISION Left 11/02/2009   buttock   Perinephtric abscess with peritonitis  11/02/1978   POLYPECTOMY  2019   TA and SSP   SQUAMOUS CELL CARCINOMA EXCISION  01/31/2014   back    Social History   Socioeconomic History   Marital  status: Married    Spouse name: Not on file   Number of children: Not on file   Years of education: Not on file   Highest education level: Not on file  Occupational History   Occupation: nurse    Employer: [employer]  Tobacco Use   Smoking status: Never   Smokeless tobacco: Never  Vaping Use   Vaping Use: Never used  Substance and Sexual Activity   Alcohol use: No    Alcohol/week: 0.0 standard drinks   Drug use: No   Sexual activity: Not on file  Other Topics Concern   Not on file  Social History Narrative   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   Married, lives with spouse - 3 daughters all in [place] area    [place] to [place] from [place] in 2010   Social Determinants of Health   Financial Resource Strain: Low Risk    Difficulty of Paying Living Expenses: Not hard at all  Food Insecurity: No Food Insecurity   Worried About Charity fundraiser in the Last Year: Never true   Arboriculturist in the Last Year: Never true  Transportation Needs: No Transportation Needs   Lack of Transportation (Medical): No   Lack of Transportation (Non-Medical): No  Physical Activity: Sufficiently Active   Days of Exercise per Week: 5 days   Minutes of Exercise per Session: 30 min  Stress: No Stress Concern Present   Feeling of Stress : Not at all  Social Connections: Unknown   Frequency of Communication with Friends and Family: More than three times a week   Frequency of Social Gatherings with Friends and Family: More than three times a week   Attends Religious Services: Patient refused   Active Member of Clubs or Organizations: Patient refused   Attends Music therapist: Patient refused   Marital Status: Married    Family History  Problem Relation Age of Onset   Breast cancer Mother 14   Arthritis Mother    Hyperlipidemia Mother    Heart disease Mother    Hypertension Mother    Kidney disease Mother    Diabetes Mother    Arthritis Father    Esophageal cancer Father 27   Congestive Heart Failure Sister    Breast cancer Maternal Aunt        70s   Breast cancer Maternal Aunt        70s   Esophageal cancer Paternal Uncle 91   Colon cancer Maternal Grandmother 78   Colon polyps Maternal Grandmother 78   Breast cancer Maternal Grandmother 72   Arthritis Maternal Grandmother    Hypertension Maternal Grandmother    Kidney disease Maternal Grandmother    Diabetes Maternal Grandmother    Breast cancer Cousin 46   Breast cancer Cousin        62s   Arthritis Other    Kidney disease Other        Lport nephritis -pt states mostly maternal side of family as the disease   Rectal  cancer Neg Hx    Stomach cancer Neg Hx    Pancreatic cancer Neg Hx     Review of Systems  Constitutional:  Negative for fever.  Respiratory:  Negative for cough, shortness of breath and wheezing.   Gastrointestinal:  Negative for abdominal pain, blood in stool, constipation, diarrhea and nausea.  No gerd  Genitourinary:  Negative for dysuria, frequency and hematuria.      Objective:   Vitals:   10/06/21 1054  BP: 100/78  Pulse: 64  Temp: 97.9 F (36.6 C)  SpO2: 97%   BP Readings from Last 3 Encounters:  10/06/21 100/78  10/03/21 (!) 113/92  06/27/21 110/80   Wt Readings from Last 3 Encounters:  10/06/21 138 lb (62.6 kg)  10/03/21 135 lb (61.2 kg)  09/19/21 135 lb (61.2 kg)   Body mass index is 24.06 kg/m.   Physical Exam Constitutional:      General: She is not in acute distress.    Appearance: She is well-developed. She is not ill-appearing.  HENT:     Head: Normocephalic and atraumatic.  Cardiovascular:     Rate and Rhythm: Normal rate and regular rhythm.     Heart sounds: No murmur heard. Pulmonary:     Effort: Pulmonary effort is normal. No respiratory distress.     Breath sounds: No wheezing or rales.  Abdominal:     General: Abdomen is flat. There is no distension.     Palpations: Abdomen is soft. There is no hepatomegaly or splenomegaly.     Tenderness: There is no abdominal tenderness.     Hernia: No hernia is present.  Skin:    General: Skin is warm and dry.  Neurological:     Mental Status: She is alert.       Last chest x-ray 04/2021: No acute cardiopulmonary findings. Mammogram 08/2021: No findings suspicious for malignancy.    Assessment & Plan:    See Problem List for Assessment and Plan of chronic medical problems.

## 2021-10-06 ENCOUNTER — Ambulatory Visit (INDEPENDENT_AMBULATORY_CARE_PROVIDER_SITE_OTHER): Payer: Medicare Other

## 2021-10-06 ENCOUNTER — Encounter: Payer: Self-pay | Admitting: Internal Medicine

## 2021-10-06 ENCOUNTER — Ambulatory Visit (INDEPENDENT_AMBULATORY_CARE_PROVIDER_SITE_OTHER): Payer: Medicare Other | Admitting: Internal Medicine

## 2021-10-06 ENCOUNTER — Other Ambulatory Visit: Payer: Self-pay

## 2021-10-06 VITALS — BP 100/78 | HR 64 | Temp 97.9°F | Ht 63.5 in | Wt 138.0 lb

## 2021-10-06 DIAGNOSIS — R1011 Right upper quadrant pain: Secondary | ICD-10-CM

## 2021-10-06 DIAGNOSIS — R0781 Pleurodynia: Secondary | ICD-10-CM | POA: Insufficient documentation

## 2021-10-06 DIAGNOSIS — I10 Essential (primary) hypertension: Secondary | ICD-10-CM | POA: Diagnosis not present

## 2021-10-06 HISTORY — DX: Right upper quadrant pain: R10.11

## 2021-10-06 HISTORY — DX: Pleurodynia: R07.81

## 2021-10-06 NOTE — Assessment & Plan Note (Addendum)
Acute Started about 2 weeks ago Discomfort in right upper quadrant/right rib region No GI/GU, respiratory symptoms ?  Worse at times with certain movements-?  Musculoskeletal/radiculopathy Pain has not improved in the last 2 weeks and does warrant further evaluation Abdominal ultrasound ordered Chest x-ray/rib x-ray and right Depending on above results may need further imaging with a CT scan

## 2021-10-06 NOTE — Assessment & Plan Note (Addendum)
Acute Started about 2 weeks ago Discomfort in right upper quadrant, occasionally feels like it in the ribs or under the ribs No GI/GU, respiratory symptoms ?  Worse at times with certain movements-?  Musculoskeletal/radiculopathy Pain has not improved in the last 2 weeks and does warrant further evaluation Abdominal ultrasound ordered Chest x-ray/rib x-ray and right Depending on above results may need further imaging with a CT scan

## 2021-10-06 NOTE — Assessment & Plan Note (Signed)
Chronic Blood pressure very well controlled-BP on the low side here today BP Readings from Last 3 Encounters:  10/06/21 100/78  10/03/21 (!) 113/92  06/27/21 110/80   Continue olmesartan 20 mg daily

## 2021-10-06 NOTE — Patient Instructions (Addendum)
     Have a chest / rib xray downstairs.     An [place] of your abdomen was ordered.

## 2021-10-07 ENCOUNTER — Telehealth: Payer: Self-pay

## 2021-10-07 NOTE — Telephone Encounter (Signed)
  Follow up Call-  Call back number 10/03/2021  Post procedure Call Back phone  # [phone]  Permission to leave phone message Yes  Some recent data might be hidden     Patient questions:  Do you have a fever, pain , or abdominal swelling? No. Pain Score  0 *  Have you tolerated food without any problems? Yes.    Have you been able to return to your normal activities? Yes.    Do you have any questions about your discharge instructions: Diet   No. Medications  No. Follow up visit  No.  Do you have questions or concerns about your Care? No.  Actions: * If pain score is 4 or above: No action needed, pain <4.

## 2021-10-08 ENCOUNTER — Encounter: Payer: Self-pay | Admitting: Gastroenterology

## 2021-10-09 ENCOUNTER — Ambulatory Visit
Admission: RE | Admit: 2021-10-09 | Discharge: 2021-10-09 | Disposition: A | Discharge status: Home / Self Care | Payer: Medicare Other | Source: Ambulatory Visit | Attending: Internal Medicine | Admitting: Internal Medicine

## 2021-10-09 DIAGNOSIS — Z9049 Acquired absence of other specified parts of digestive tract: Secondary | ICD-10-CM | POA: Diagnosis not present

## 2021-10-09 DIAGNOSIS — N281 Cyst of kidney, acquired: Secondary | ICD-10-CM | POA: Diagnosis not present

## 2021-10-09 DIAGNOSIS — N189 Chronic kidney disease, unspecified: Secondary | ICD-10-CM | POA: Diagnosis not present

## 2021-10-09 DIAGNOSIS — Z20822 Contact with and (suspected) exposure to covid-19: Secondary | ICD-10-CM | POA: Diagnosis not present

## 2021-10-09 DIAGNOSIS — R1011 Right upper quadrant pain: Secondary | ICD-10-CM

## 2021-10-16 DIAGNOSIS — L0291 Cutaneous abscess, unspecified: Secondary | ICD-10-CM | POA: Diagnosis not present

## 2021-10-20 ENCOUNTER — Other Ambulatory Visit: Payer: Medicare Other

## 2021-10-21 ENCOUNTER — Ambulatory Visit: Payer: Medicare Other | Admitting: Internal Medicine

## 2021-11-27 DIAGNOSIS — Z87898 Personal history of other specified conditions: Secondary | ICD-10-CM | POA: Diagnosis not present

## 2021-11-27 DIAGNOSIS — D2272 Melanocytic nevi of left lower limb, including hip: Secondary | ICD-10-CM | POA: Diagnosis not present

## 2021-11-27 DIAGNOSIS — D485 Neoplasm of uncertain behavior of skin: Secondary | ICD-10-CM | POA: Diagnosis not present

## 2021-11-27 DIAGNOSIS — D225 Melanocytic nevi of trunk: Secondary | ICD-10-CM | POA: Diagnosis not present

## 2021-11-27 DIAGNOSIS — Z808 Family history of malignant neoplasm of other organs or systems: Secondary | ICD-10-CM | POA: Diagnosis not present

## 2021-11-27 DIAGNOSIS — L578 Other skin changes due to chronic exposure to nonionizing radiation: Secondary | ICD-10-CM | POA: Diagnosis not present

## 2021-11-27 DIAGNOSIS — L821 Other seborrheic keratosis: Secondary | ICD-10-CM | POA: Diagnosis not present

## 2021-11-27 DIAGNOSIS — D2271 Melanocytic nevi of right lower limb, including hip: Secondary | ICD-10-CM | POA: Diagnosis not present

## 2021-11-27 DIAGNOSIS — D2262 Melanocytic nevi of left upper limb, including shoulder: Secondary | ICD-10-CM | POA: Diagnosis not present

## 2021-11-27 DIAGNOSIS — Z86018 Personal history of other benign neoplasm: Secondary | ICD-10-CM | POA: Diagnosis not present

## 2021-12-10 NOTE — Progress Notes (Signed)
Subjective:    Patient ID: Sarah Valentine, female    DOB: 1951-08-07, 71 y.o.   MRN: [id number]  This visit occurred during the SARS-CoV-2 public health emergency.  Safety protocols were in place, including screening questions prior to the visit, additional usage of staff PPE, and extensive cleaning of exam room while observing appropriate contact time as indicated for disinfecting solutions.    HPI The patient is here for an acute visit.   Swollen glands, plugged ears - muffled, dec hearing, little craking in ears at times. No pain - just pressure  Two weeks - tried sudafed, mucinex, zyrted. No improvement  Her symptoms started around the same time one of her grandchild was sick.    Medications and allergies reviewed with patient and updated if appropriate.  Patient Active Problem List   Diagnosis Date Noted   RUQ discomfort 10/06/2021   Rib pain on right side 10/06/2021   Lumbar radiculopathy 05/09/2019   Frequent UTI 08/29/2018   Prediabetes 10/07/2016   Lung nodule, solitary - benign, no f/u 10/07/2016   Family history of breast cancer 11/25/2015   Nephritis, hereditary 11/14/2014   Incomplete RBBB    Hypertension    Hyperlipidemia    History of melanoma    Migraines    BARRETTS ESOPHAGUS 09/05/2010   PERSONAL HISTORY OF COLONIC POLYPS 09/05/2010    Current Outpatient Medications on File Prior to Visit  Medication Sig Dispense Refill   Cholecalciferol (VITAMIN D3) 50 MCG (2000 UT) capsule Take 2,000 Units by mouth daily.     Cranberry 1000 MG CAPS Take 500 mg by mouth daily.      estradiol (ESTRACE) 0.1 MG/GM vaginal cream Place 1 Applicatorful vaginally 2 (two) times a week. 42.5 g 11   olmesartan (BENICAR) 20 MG tablet Take 1 tablet (20 mg total) by mouth daily. 90 tablet 3   sodium bicarbonate 650 MG tablet Take 650 mg by mouth 2 (two) times daily.     No current facility-administered medications on file prior to visit.    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   excised from back [facility])    Past Surgical History:  Procedure Laterality Date   ABDOMINAL HYSTERECTOMY  11/03/1991   BACK SURGERY  2020   and in 2021 due to herniated disc   BASAL CELL CARCINOMA EXCISION  08/02/2013   back   CHOLECYSTECTOMY  11/02/2008   COLONOSCOPY  2019   DJ-MAC-suprep(good)-tics/polyps x 5 (TA/SSP)   COLONOSCOPY W/ BIOPSIES AND POLYPECTOMY  12/03/2008   polyp removed   MELANOMA EXCISION Left 11/02/2009   buttock   Perinephtric abscess with peritonitis  11/02/1978   POLYPECTOMY  2019   TA and SSP   SQUAMOUS CELL CARCINOMA EXCISION  01/31/2014   back    Social History   Socioeconomic History   Marital status: Married    Spouse name: Not on file  Number of children: Not on file   Years of education: Not on file   Highest education level: Not on file  Occupational History   Occupation: nurse    Employer: [employer]  Tobacco Use   Smoking status: Never   Smokeless tobacco: Never  Vaping Use   Vaping Use: Never used  Substance and Sexual Activity   Alcohol use: No    Alcohol/week: 0.0 standard drinks   Drug use: No   Sexual activity: Not on file  Other Topics Concern   Not on file  Social History Narrative   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   Married, lives with spouse - 3 daughters all in [place] area   [place] to [place] from [place]  in 2010   Social Determinants of Health   Financial Resource Strain: Low Risk    Difficulty of Paying Living Expenses: Not hard at all  Food Insecurity: No Food Insecurity   Worried About Charity fundraiser in the Last Year: Never true   Arboriculturist in the Last Year: Never true  Transportation Needs: No Transportation Needs   Lack of Transportation (Medical): No   Lack of Transportation (Non-Medical): No  Physical Activity: Sufficiently Active   Days of Exercise per Week: 5 days   Minutes of Exercise per Session: 30 min  Stress: No Stress Concern Present   Feeling of Stress : Not at all  Social Connections: Unknown   Frequency of Communication with Friends and Family: More than three times a week   Frequency of Social Gatherings with Friends and Family: More than three times a week   Attends Religious Services: Patient refused   Active Member of Clubs or Organizations: Patient refused   Attends Music therapist: Patient refused   Marital Status: Married    Family History  Problem Relation Age of Onset   Breast cancer Mother 80   Arthritis Mother    Hyperlipidemia Mother    Heart disease Mother    Hypertension Mother    Kidney disease Mother    Diabetes Mother    Arthritis Father    Esophageal cancer Father 79   Congestive Heart Failure Sister    Breast cancer Maternal Aunt        70s   Breast cancer Maternal Aunt        70s   Esophageal cancer Paternal Uncle 91   Colon cancer Maternal Grandmother 78   Colon polyps Maternal Grandmother 78   Breast cancer Maternal Grandmother 72   Arthritis Maternal Grandmother    Hypertension Maternal Grandmother    Kidney disease Maternal Grandmother    Diabetes Maternal Grandmother    Breast cancer Cousin 46   Breast cancer Cousin        5s   Arthritis Other    Kidney disease Other        [place]port nephritis -pt states mostly maternal side of family as the disease   Rectal cancer Neg Hx    Stomach cancer Neg Hx     Pancreatic cancer Neg Hx     Review of Systems  Constitutional:  Negative for fever.  HENT:  Positive for hearing loss, postnasal drip, rhinorrhea and sore throat. Negative for congestion, ear discharge, ear pain and sinus pressure.   Respiratory:  Negative for cough, shortness of breath and wheezing.   Neurological:  Negative for dizziness  and headaches.      Objective:   Vitals:   12/11/21 1000  BP: 104/74  Pulse: 65  Temp: 98.1 F (36.7 C)  SpO2: 96%   BP Readings from Last 3 Encounters:  12/11/21 104/74  10/06/21 100/78  10/03/21 (!) 113/92   Wt Readings from Last 3 Encounters:  12/11/21 139 lb 6.4 oz (63.2 kg)  10/06/21 138 lb (62.6 kg)  10/03/21 135 lb (61.2 kg)   Body mass index is 24.31 kg/m.   Physical Exam    GENERAL APPEARANCE: Appears stated age, well appearing, NAD EYES: conjunctiva clear, no icterus HENT: bilateral tympanic membranes b/l dull but no erythema, b/l ear canals normal, oropharynx with no erythema or exudates, trachea midline, no cervical or supraclavicular lymphadenopathy LUNGS: Unlabored breathing, good air entry bilaterally, clear to auscultation without wheeze or crackles CARDIOVASCULAR: Normal S1,S2 , no edema SKIN: Warm, dry      Assessment & Plan:    See Problem List for Assessment and Plan of chronic medical problems.

## 2021-12-11 ENCOUNTER — Ambulatory Visit (INDEPENDENT_AMBULATORY_CARE_PROVIDER_SITE_OTHER): Payer: Medicare Other | Admitting: Internal Medicine

## 2021-12-11 ENCOUNTER — Encounter: Payer: Self-pay | Admitting: Internal Medicine

## 2021-12-11 ENCOUNTER — Other Ambulatory Visit: Payer: Self-pay

## 2021-12-11 DIAGNOSIS — J0100 Acute maxillary sinusitis, unspecified: Secondary | ICD-10-CM

## 2021-12-11 DIAGNOSIS — H6983 Other specified disorders of Eustachian tube, bilateral: Secondary | ICD-10-CM | POA: Diagnosis not present

## 2021-12-11 DIAGNOSIS — J019 Acute sinusitis, unspecified: Secondary | ICD-10-CM | POA: Insufficient documentation

## 2021-12-11 MED ORDER — PREDNISONE 50 MG PO TABS: 50.0000 mg | ORAL_TABLET | Freq: Every day | ORAL | 0 refills | Status: DC

## 2021-12-11 MED ORDER — DOXYCYCLINE HYCLATE 100 MG PO TABS: 100.0000 mg | ORAL_TABLET | Freq: Two times a day (BID) | ORAL | 0 refills | Status: AC

## 2021-12-11 NOTE — Assessment & Plan Note (Signed)
Acute B/l ears Likely related to sinus infection Sudafed, mucinex, cold meds not helping Start doxycyline 100 mg bid x 10 days flonase 2 sprays once a day Sudafed as directed  If no improvement - Prednisone 50 mg daily x 5 days

## 2021-12-11 NOTE — Assessment & Plan Note (Signed)
Acute Possible sinus infection causing ETD Start doxycyline 100  mg BID x 10 day Call if no improvement

## 2021-12-11 NOTE — Patient Instructions (Signed)
Medications changes include :   doxycyline 100 mg twice daily x 10 days, prednisone.  Take sudafed and flonase.   Your prescription(s) have been submitted to your pharmacy. Please take as directed and contact our office if you believe you are having problem(s) with the medication(s).   Eustachian Tube Dysfunction Eustachian tube dysfunction refers to a condition in which a blockage develops in the narrow passage that connects the middle ear to the back of the nose (eustachian tube). The eustachian tube regulates air pressure in the middle ear by letting air move between the ear and nose. It also helps to drain fluid from the middle ear space. Eustachian tube dysfunction can affect one or both ears. When the eustachian tube does not function properly, air pressure, fluid, or both can build up in the middle ear. What are the causes? This condition occurs when the eustachian tube becomes blocked or cannot open normally. Common causes of this condition include: Ear infections. Colds and other infections that affect the nose, mouth, and throat (upper respiratory tract). Allergies. Irritation from cigarette smoke. Irritation from stomach acid coming up into the esophagus (gastroesophageal reflux). The esophagus is the part of the body that moves food from the mouth to the stomach. Sudden changes in air pressure, such as from descending in an airplane or scuba diving. Abnormal growths in the nose or throat, such as: Growths that line the nose (nasal polyps). Abnormal growth of cells (tumors). Enlarged tissue at the back of the throat (adenoids). What increases the risk? You are more likely to develop this condition if: You smoke. You are overweight. You are a child who has: Certain birth defects of the mouth, such as cleft palate. Large tonsils or adenoids. What are the signs or symptoms? Common symptoms of this condition include: A feeling of fullness in the ear. Ear pain. Clicking or  popping noises in the ear. Ringing in the ear (tinnitus). Hearing loss. Loss of balance. Dizziness. Symptoms may get worse when the air pressure around you changes, such as when you travel to an area of high elevation, fly on an airplane, or go scuba diving. How is this diagnosed? This condition may be diagnosed based on: Your symptoms. A physical exam of your ears, nose, and throat. Tests, such as those that measure: The movement of your eardrum. Your hearing (audiometry). How is this treated? Treatment depends on the cause and severity of your condition. In mild cases, you may relieve your symptoms by moving air into your ears. This is called "popping the ears." In more severe cases, or if you have symptoms of fluid in your ears, treatment may include: Medicines to relieve congestion (decongestants). Medicines that treat allergies (antihistamines). Nasal sprays or ear drops that contain medicines that reduce swelling (steroids). A procedure to drain the fluid in your eardrum. In this procedure, a small tube may be placed in the eardrum to: Drain the fluid. Restore the air in the middle ear space. A procedure to insert a balloon device through the nose to inflate the opening of the eustachian tube (balloon dilation). Follow these instructions at home: Lifestyle Do not do any of the following until your health care provider approves: Travel to high altitudes. Fly in airplanes. Work in a Pension scheme manager or room. Scuba dive. Do not use any products that contain nicotine or tobacco. These products include cigarettes, chewing tobacco, and vaping devices, such as e-cigarettes. If you need help quitting, ask your health care provider. Keep your ears  dry. Wear fitted earplugs during showering and bathing. Dry your ears completely after. General instructions Take over-the-counter and prescription medicines only as told by your health care provider. Use techniques to help pop your ears as  recommended by your health care provider. These may include: Chewing gum. Yawning. Frequent, forceful swallowing. Closing your mouth, holding your nose closed, and gently blowing as if you are trying to blow air out of your nose. Keep all follow-up visits. This is important. Contact a health care provider if: Your symptoms do not go away after treatment. Your symptoms come back after treatment. You are unable to pop your ears. You have: A fever. Pain in your ear. Pain in your head or neck. Fluid draining from your ear. Your hearing suddenly changes. You become very dizzy. You lose your balance. Get help right away if: You have a sudden, severe increase in any of your symptoms. Summary Eustachian tube dysfunction refers to a condition in which a blockage develops in the eustachian tube. It can be caused by ear infections, allergies, inhaled irritants, or abnormal growths in the nose or throat. Symptoms may include ear pain or fullness, hearing loss, or ringing in the ears. Mild cases are treated with techniques to unblock the ears, such as yawning or chewing gum. More severe cases are treated with medicines or procedures. This information is not intended to replace advice given to you by your health care provider. Make sure you discuss any questions you have with your health care provider. Document Revised: 12/30/2020 Document Reviewed: 12/30/2020 Elsevier Patient Education  2022 [employer].

## 2021-12-15 DIAGNOSIS — Z20822 Contact with and (suspected) exposure to covid-19: Secondary | ICD-10-CM | POA: Diagnosis not present

## 2021-12-23 DIAGNOSIS — N184 Chronic kidney disease, stage 4 (severe): Secondary | ICD-10-CM | POA: Diagnosis not present

## 2021-12-29 DIAGNOSIS — E872 Acidosis, unspecified: Secondary | ICD-10-CM | POA: Diagnosis not present

## 2021-12-29 DIAGNOSIS — N2581 Secondary hyperparathyroidism of renal origin: Secondary | ICD-10-CM | POA: Diagnosis not present

## 2021-12-29 DIAGNOSIS — N184 Chronic kidney disease, stage 4 (severe): Secondary | ICD-10-CM | POA: Diagnosis not present

## 2021-12-29 DIAGNOSIS — R809 Proteinuria, unspecified: Secondary | ICD-10-CM | POA: Diagnosis not present

## 2021-12-29 DIAGNOSIS — D631 Anemia in chronic kidney disease: Secondary | ICD-10-CM | POA: Diagnosis not present

## 2021-12-29 DIAGNOSIS — I129 Hypertensive chronic kidney disease with stage 1 through stage 4 chronic kidney disease, or unspecified chronic kidney disease: Secondary | ICD-10-CM | POA: Diagnosis not present

## 2022-01-08 DIAGNOSIS — Z20828 Contact with and (suspected) exposure to other viral communicable diseases: Secondary | ICD-10-CM | POA: Diagnosis not present

## 2022-01-26 DIAGNOSIS — Z20822 Contact with and (suspected) exposure to covid-19: Secondary | ICD-10-CM | POA: Diagnosis not present

## 2022-01-30 DIAGNOSIS — Z20822 Contact with and (suspected) exposure to covid-19: Secondary | ICD-10-CM | POA: Diagnosis not present

## 2022-02-05 DIAGNOSIS — Z20822 Contact with and (suspected) exposure to covid-19: Secondary | ICD-10-CM | POA: Diagnosis not present

## 2022-02-16 DIAGNOSIS — Z20822 Contact with and (suspected) exposure to covid-19: Secondary | ICD-10-CM | POA: Diagnosis not present

## 2022-03-07 DIAGNOSIS — Z20822 Contact with and (suspected) exposure to covid-19: Secondary | ICD-10-CM | POA: Diagnosis not present

## 2022-03-09 DIAGNOSIS — Z20822 Contact with and (suspected) exposure to covid-19: Secondary | ICD-10-CM | POA: Diagnosis not present

## 2022-03-25 NOTE — Progress Notes (Unsigned)
    Subjective:    Patient ID: Sarah Valentine, female    DOB: September 01, 1951, 71 y.o.   MRN: [id number]      HPI Sarah Valentine is here for No chief complaint on file.    Right arm      Medications and allergies reviewed with patient and updated if appropriate.  Current Outpatient Medications on File Prior to Visit  Medication Sig Dispense Refill   Cholecalciferol (VITAMIN D3) 50 MCG (2000 UT) capsule Take 2,000 Units by mouth daily.     Cranberry 1000 MG CAPS Take 500 mg by mouth daily.      estradiol (ESTRACE) 0.1 MG/GM vaginal cream Place 1 Applicatorful vaginally 2 (two) times a week. 42.5 g 11   olmesartan (BENICAR) 20 MG tablet Take 1 tablet (20 mg total) by mouth daily. 90 tablet 3   predniSONE (DELTASONE) 50 MG tablet Take 1 tablet (50 mg total) by mouth daily with breakfast. 5 tablet 0   sodium bicarbonate 650 MG tablet Take 650 mg by mouth 2 (two) times daily.     No current facility-administered medications on file prior to visit.    Review of Systems     Objective:  There were no vitals filed for this visit. BP Readings from Last 3 Encounters:  12/11/21 104/74  10/06/21 100/78  10/03/21 (!) 113/92   Wt Readings from Last 3 Encounters:  12/11/21 139 lb 6.4 oz (63.2 kg)  10/06/21 138 lb (62.6 kg)  10/03/21 135 lb (61.2 kg)   There is no height or weight on file to calculate BMI.    Physical Exam         Assessment & Plan:    See Problem List for Assessment and Plan of chronic medical problems.

## 2022-03-26 ENCOUNTER — Ambulatory Visit: Payer: Self-pay

## 2022-03-26 ENCOUNTER — Ambulatory Visit (INDEPENDENT_AMBULATORY_CARE_PROVIDER_SITE_OTHER): Payer: Medicare Other

## 2022-03-26 ENCOUNTER — Ambulatory Visit (INDEPENDENT_AMBULATORY_CARE_PROVIDER_SITE_OTHER): Payer: Medicare Other | Admitting: Family Medicine

## 2022-03-26 ENCOUNTER — Encounter: Payer: Self-pay | Admitting: Internal Medicine

## 2022-03-26 ENCOUNTER — Encounter: Payer: Self-pay | Admitting: Family Medicine

## 2022-03-26 ENCOUNTER — Ambulatory Visit (INDEPENDENT_AMBULATORY_CARE_PROVIDER_SITE_OTHER): Payer: Medicare Other | Admitting: Internal Medicine

## 2022-03-26 VITALS — BP 104/72 | HR 80 | Ht 63.5 in | Wt 137.0 lb

## 2022-03-26 VITALS — BP 104/72 | HR 80 | Temp 97.8°F | Ht 63.5 in | Wt 137.0 lb

## 2022-03-26 DIAGNOSIS — M25521 Pain in right elbow: Secondary | ICD-10-CM

## 2022-03-26 DIAGNOSIS — M25621 Stiffness of right elbow, not elsewhere classified: Secondary | ICD-10-CM | POA: Insufficient documentation

## 2022-03-26 HISTORY — DX: Pain in right elbow: M25.521

## 2022-03-26 HISTORY — DX: Stiffness of right elbow, not elsewhere classified: M25.621

## 2022-03-26 NOTE — Patient Instructions (Addendum)
Nice to meet you today.  Follow-up: in about 6 weeks.   I've referred you to Physical Therapy.  Let us know if you don't hear from them in one week.

## 2022-03-26 NOTE — Progress Notes (Signed)
    Subjective:    CC: R elbow pain  I, Molly Weber, LAT, ATC, am serving as scribe for Dr. Lynne Leader.  HPI: Pt is a 71 y/o RHD female presenting w/ c/o R elbow pain after waking up about a week ago and not being able to fully flex her elbow.  She locates her pain to her R ant and post elbow.   She reports having previously broken her R arm when she was a senior in [place] and was in a cast but other than that, no injuries to her R elbow/arm. She notes mild elbow soreness.  She is going on a [place] cruise in late August.  Swelling: no Aggravating factors: R elbow flexion past where it wants to bend Treatments tried: heat;   Pertinent review of Systems: No fevers or chills  Relevant historical information: Chronic kidney disease   Objective:    Vitals:   03/26/22 0936  BP: 104/72  Pulse: 80  SpO2: 96%   General: Well Developed, well nourished, and in no acute distress.   MSK: Right elbow: Normal-appearing Minimally tender to palpation posterior to lateral elbow. Range of motion lacks full extension by 1 degrees.  Normal pronation and supination. Flexion to 110 degrees active and passive. Strength is intact.  Lab and Radiology Results  Diagnostic Limited MSK Ultrasound of: Right elbow Lateral elbow minimal joint effusion with scattered hyperechoic change at lateral joint line which could be calcific crystals (chondrocalcinosis). Medial joint line normal-appearing Impression: Mild joint effusion possible chondrocalcinosis.  X-ray images right elbow obtained today personally and independently interpreted. Mild medial elbow spur otherwise normal-appearing No chondrocalcinosis is visible on x-ray per my read. Await formal radiology review  Impression and Recommendations:    Assessment and Plan: 71 y.o. female with right elbow stiffness with decreased flexion range of motion.  Etiology is unclear.  I am suspicious for adhesive capsulitis.  Plan for hand therapy  referral.  Recheck in 6 weeks.  Return sooner if needed.  Next step if not better could be injection and/or MRI.Marland Kitchen  PDMP not reviewed this encounter. Orders Placed This Encounter  Procedures   [place] LIMITED JOINT SPACE STRUCTURES UP RIGHT(NO LINKED CHARGES)    Order Specific Question:   Reason for Exam (SYMPTOM  OR DIAGNOSIS REQUIRED)    Answer:   R elbow pain    Order Specific Question:   Preferred imaging location?    Answer:   [facility]   DG ELBOW COMPLETE RIGHT (3+VIEW)    Standing Status:   Future    Number of Occurrences:   1    Standing Expiration Date:   04/26/2022    Order Specific Question:   Reason for Exam (SYMPTOM  OR DIAGNOSIS REQUIRED)    Answer:   R elbow pain    Order Specific Question:   Preferred imaging location?    Answer:   [facility]   Ambulatory referral to Physical Therapy    Referral Priority:   Routine    Referral Type:   Physical Medicine    Referral Reason:   Specialty Services Required    Requested Specialty:   Physical Therapy    Number of Visits Requested:   1   No orders of the defined types were placed in this encounter.   Discussed warning signs or symptoms. Please see discharge instructions. Patient expresses understanding.   The above documentation has been reviewed and is accurate and complete Lynne Leader, M.D.

## 2022-03-26 NOTE — Patient Instructions (Signed)
  Make an appointment downstairs with sports medicine - right elbow decreased ROM, mild pain   Medications changes include :  none    A referral was ordered for sports medicine.     Someone from that office will call you to schedule an appointment.

## 2022-03-26 NOTE — Assessment & Plan Note (Signed)
Acute Woke up at 8-9 days ago and had decreased range of motion of right elbow-only able to flex to 90 degrees.  Some discomfort/tenderness above and below the joint anteriorly and lateral elbow No obvious injury or activity that could have caused it Not improved with conservative measures Not able to take NSAIDs We will have her see sports medicine for further evaluation

## 2022-03-31 NOTE — Progress Notes (Signed)
Radiology does see fluid in the elbow joint.  If not improving we can look at this more closely with the MRI.

## 2022-04-02 DIAGNOSIS — M25621 Stiffness of right elbow, not elsewhere classified: Secondary | ICD-10-CM | POA: Diagnosis not present

## 2022-04-02 DIAGNOSIS — M6281 Muscle weakness (generalized): Secondary | ICD-10-CM | POA: Diagnosis not present

## 2022-04-02 DIAGNOSIS — M25521 Pain in right elbow: Secondary | ICD-10-CM | POA: Diagnosis not present

## 2022-04-02 DIAGNOSIS — M25421 Effusion, right elbow: Secondary | ICD-10-CM | POA: Diagnosis not present

## 2022-04-09 DIAGNOSIS — M25421 Effusion, right elbow: Secondary | ICD-10-CM | POA: Diagnosis not present

## 2022-04-09 DIAGNOSIS — M25621 Stiffness of right elbow, not elsewhere classified: Secondary | ICD-10-CM | POA: Diagnosis not present

## 2022-04-09 DIAGNOSIS — M6281 Muscle weakness (generalized): Secondary | ICD-10-CM | POA: Diagnosis not present

## 2022-04-09 DIAGNOSIS — M25521 Pain in right elbow: Secondary | ICD-10-CM | POA: Diagnosis not present

## 2022-04-16 DIAGNOSIS — M25621 Stiffness of right elbow, not elsewhere classified: Secondary | ICD-10-CM | POA: Diagnosis not present

## 2022-04-16 DIAGNOSIS — M6281 Muscle weakness (generalized): Secondary | ICD-10-CM | POA: Diagnosis not present

## 2022-04-16 DIAGNOSIS — M25421 Effusion, right elbow: Secondary | ICD-10-CM | POA: Diagnosis not present

## 2022-04-16 DIAGNOSIS — M25521 Pain in right elbow: Secondary | ICD-10-CM | POA: Diagnosis not present

## 2022-04-17 DIAGNOSIS — M25421 Effusion, right elbow: Secondary | ICD-10-CM | POA: Diagnosis not present

## 2022-04-17 DIAGNOSIS — M25521 Pain in right elbow: Secondary | ICD-10-CM | POA: Diagnosis not present

## 2022-04-17 DIAGNOSIS — M25621 Stiffness of right elbow, not elsewhere classified: Secondary | ICD-10-CM | POA: Diagnosis not present

## 2022-04-17 DIAGNOSIS — M6281 Muscle weakness (generalized): Secondary | ICD-10-CM | POA: Diagnosis not present

## 2022-04-21 DIAGNOSIS — M25521 Pain in right elbow: Secondary | ICD-10-CM | POA: Diagnosis not present

## 2022-04-21 DIAGNOSIS — M25621 Stiffness of right elbow, not elsewhere classified: Secondary | ICD-10-CM | POA: Diagnosis not present

## 2022-04-21 DIAGNOSIS — M25421 Effusion, right elbow: Secondary | ICD-10-CM | POA: Diagnosis not present

## 2022-04-21 DIAGNOSIS — M6281 Muscle weakness (generalized): Secondary | ICD-10-CM | POA: Diagnosis not present

## 2022-04-23 DIAGNOSIS — M25621 Stiffness of right elbow, not elsewhere classified: Secondary | ICD-10-CM | POA: Diagnosis not present

## 2022-04-23 DIAGNOSIS — M25421 Effusion, right elbow: Secondary | ICD-10-CM | POA: Diagnosis not present

## 2022-04-23 DIAGNOSIS — M6281 Muscle weakness (generalized): Secondary | ICD-10-CM | POA: Diagnosis not present

## 2022-04-23 DIAGNOSIS — M25521 Pain in right elbow: Secondary | ICD-10-CM | POA: Diagnosis not present

## 2022-04-29 NOTE — Progress Notes (Unsigned)
   I, Wendy Poet, LAT, ATC, am serving as scribe for Dr. Lynne Leader.  Sarah Valentine is a 71 y.o. female who presents to [place] [facility] today for f/u of R elbow pain thought to be due to adhesive capsulitis.  She was last seen by Dr. Georgina Snell on 03/26/22 and was referred to PT.  Today, pt reports that her R elbow pain has improved and has much better elbow ROM compared to her last visit.  She rates her improvement at 80%.  Diagnostic testing: R elbow XR- 03/26/22  Pertinent review of systems: No fevers or chills  Relevant historical information: Hypertension   Exam:  BP 102/78 (BP Location: Right Arm, Patient Position: Sitting, Cuff Size: Normal)   Pulse 71   Ht 5' 3.5" (1.613 m)   Wt 138 lb 6.4 oz (62.8 kg)   SpO2 95%   BMI 24.13 kg/m  General: Well Developed, well nourished, and in no acute distress.   MSK: Right elbow normal.  Nontender range of motion lacks full flexion but otherwise intact normal range of motion.    Lab and Radiology Results EXAM: RIGHT ELBOW - COMPLETE 3+ VIEW   COMPARISON:  None Available.   FINDINGS: Mild peripheral medial elbow degenerative osteophytosis at the medial aspect of the coronoid. There is elevation of the distal anterior humeral fat pad indicating an elbow joint effusion. There is mild spurring at the volar aspect of the coronoid process. No acute fracture line is seen. No dislocation.   IMPRESSION: No acute fracture is seen, however there does appear to be an elbow joint effusion. If there is clinical concern for a radiographically occult fracture, consider CT or MRI for further evaluation.   Mild medial elbow osteoarthritis.     Electronically Signed   By: Yvonne Kendall M.D.   On: 03/27/2022 14:05   I, Lynne Leader, personally (independently) visualized and performed the interpretation of the images attached in this note.     Assessment and Plan: 71 y.o. female with right elbow decreased  range of motion.  This was thought to be due to adhesive capsulitis.  She has significantly improved with some physical therapy at home exercise program.  At this point her range of motion has almost completely returned to normal.  She feels well.  Continue at home exercise program and recheck back as needed.  Precautions reviewed.      Discussed warning signs or symptoms. Please see discharge instructions. Patient expresses understanding.   The above documentation has been reviewed and is accurate and complete Lynne Leader, M.D.

## 2022-04-30 ENCOUNTER — Encounter: Payer: Self-pay | Admitting: Family Medicine

## 2022-04-30 ENCOUNTER — Ambulatory Visit (INDEPENDENT_AMBULATORY_CARE_PROVIDER_SITE_OTHER): Payer: Medicare Other | Admitting: Family Medicine

## 2022-04-30 VITALS — BP 102/78 | HR 71 | Ht 63.5 in | Wt 138.4 lb

## 2022-04-30 DIAGNOSIS — M25521 Pain in right elbow: Secondary | ICD-10-CM | POA: Diagnosis not present

## 2022-04-30 DIAGNOSIS — M25421 Effusion, right elbow: Secondary | ICD-10-CM | POA: Diagnosis not present

## 2022-04-30 DIAGNOSIS — M25621 Stiffness of right elbow, not elsewhere classified: Secondary | ICD-10-CM | POA: Diagnosis not present

## 2022-04-30 DIAGNOSIS — M6281 Muscle weakness (generalized): Secondary | ICD-10-CM | POA: Diagnosis not present

## 2022-04-30 NOTE — Patient Instructions (Signed)
Good to see you today.  Keep working on your R elbow ROM.  Follow-up as needed.  Enjoy your 4th in [place].

## 2022-05-01 DIAGNOSIS — M6281 Muscle weakness (generalized): Secondary | ICD-10-CM | POA: Diagnosis not present

## 2022-05-01 DIAGNOSIS — M25621 Stiffness of right elbow, not elsewhere classified: Secondary | ICD-10-CM | POA: Diagnosis not present

## 2022-05-01 DIAGNOSIS — M25421 Effusion, right elbow: Secondary | ICD-10-CM | POA: Diagnosis not present

## 2022-05-01 DIAGNOSIS — M25521 Pain in right elbow: Secondary | ICD-10-CM | POA: Diagnosis not present

## 2022-05-03 DIAGNOSIS — H6690 Otitis media, unspecified, unspecified ear: Secondary | ICD-10-CM | POA: Diagnosis not present

## 2022-05-12 ENCOUNTER — Other Ambulatory Visit: Payer: Self-pay | Admitting: Internal Medicine

## 2022-05-12 DIAGNOSIS — Z1231 Encounter for screening mammogram for malignant neoplasm of breast: Secondary | ICD-10-CM

## 2022-05-28 NOTE — Patient Instructions (Addendum)
     Blood work was ordered.     Medications changes include :   None   Your prescription(s) have been sent to your pharmacy.      Return in about 1 year (around 05/30/2023) for follow up.

## 2022-05-28 NOTE — Progress Notes (Signed)
Subjective:    Patient ID: Sarah Valentine, female    DOB: September 30, 1951, 71 y.o.   MRN: [id number]     HPI Sarah Valentine is here for follow up of her chronic medical problems, including htn, nephritis, prediabetes, frequent UTI  Left ear plugged and ear pain-diagnosed with otitis media -treated with augmentin  for infection.  At the time she was on steroids for a flareup of her back pain.  She is still having plugged sensation in the ear with dec hearing since July 1.   She has tried sudafed, mucinex, zyrtec, flonase.  She sees ENT next Tuesday.  I did [employer] text that I emailed Denny Peon and she had not that she saw until Monday and then she must contact her   Walking 2 miles twice a day.    Medications and allergies reviewed with patient and updated if appropriate.  Current Outpatient Medications on File Prior to Visit  Medication Sig Dispense Refill   Cholecalciferol (VITAMIN D3) 50 MCG (2000 UT) capsule Take 2,000 Units by mouth daily.     Cranberry 1000 MG CAPS Take 500 mg by mouth daily.      estradiol (ESTRACE) 0.1 MG/GM vaginal cream Place 1 Applicatorful vaginally 2 (two) times a week. 42.5 g 11   olmesartan (BENICAR) 20 MG tablet Take 1 tablet (20 mg total) by mouth daily. 90 tablet 3   sodium bicarbonate 650 MG tablet Take 650 mg by mouth 2 (two) times daily.     No current facility-administered medications on file prior to visit.     Review of Systems  Constitutional:  Negative for fever.  HENT:  Positive for hearing loss.        Ft ear plugged  Respiratory:  Negative for cough, shortness of breath and wheezing.   Cardiovascular:  Positive for palpitations (rare). Negative for chest pain and leg swelling.  Neurological:  Negative for dizziness, light-headedness and headaches.       Objective:   Vitals:   05/29/22 1058  BP: 98/68  Pulse: 74  Temp: 98.2 F (36.8 C)  SpO2: 97%   BP Readings from Last 3 Encounters:  05/29/22 98/68  04/30/22 102/78  03/26/22  104/72   Wt Readings from Last 3 Encounters:  05/29/22 135 lb (61.2 kg)  04/30/22 138 lb 6.4 oz (62.8 kg)  03/26/22 137 lb (62.1 kg)   Body mass index is 23.54 kg/m.    Physical Exam Constitutional:      General: She is not in acute distress.    Appearance: Normal appearance.  HENT:     Head: Normocephalic and atraumatic.     Left Ear: Tympanic membrane, ear canal and external ear normal. There is no impacted cerumen.  Eyes:     Conjunctiva/sclera: Conjunctivae normal.  Cardiovascular:     Rate and Rhythm: Normal rate and regular rhythm.     Heart sounds: Normal heart sounds. No murmur heard. Pulmonary:     Effort: Pulmonary effort is normal. No respiratory distress.     Breath sounds: Normal breath sounds. No wheezing.  Musculoskeletal:     Cervical back: Neck supple.     Right lower leg: No edema.     Left lower leg: No edema.  Lymphadenopathy:     Cervical: No cervical adenopathy.  Skin:    General: Skin is warm and dry.     Findings: No rash.  Neurological:     Mental Status: She is alert. Mental status is at  baseline.  Psychiatric:        Mood and Affect: Mood normal.        Behavior: Behavior normal.        Lab Results  Component Value Date   WBC 6.4 06/18/2021   HGB 12.3 06/18/2021   HCT 36.9 06/18/2021   PLT 247.0 06/18/2021   GLUCOSE 88 06/18/2021   CHOL 230 (H) 06/18/2021   TRIG 147.0 06/18/2021   HDL 53.00 06/18/2021   LDLDIRECT 171.3 10/20/2013   LDLCALC 148 (H) 06/18/2021   ALT 15 06/18/2021   AST 22 06/18/2021   NA 143 06/18/2021   K 4.3 06/18/2021   CL 106 06/18/2021   CREATININE 1.92 (H) 06/18/2021   BUN 36 (H) 06/18/2021   CO2 26 06/18/2021   TSH 1.39 06/18/2021   HGBA1C 6.0 06/18/2021   MICROALBUR 61.0 (H) 03/31/2019     Assessment & Plan:    See Problem List for Assessment and Plan of chronic medical problems.

## 2022-05-29 ENCOUNTER — Encounter: Payer: Self-pay | Admitting: Internal Medicine

## 2022-05-29 ENCOUNTER — Other Ambulatory Visit: Payer: Self-pay

## 2022-05-29 ENCOUNTER — Ambulatory Visit (INDEPENDENT_AMBULATORY_CARE_PROVIDER_SITE_OTHER): Payer: Medicare Other | Admitting: Internal Medicine

## 2022-05-29 VITALS — BP 98/68 | HR 74 | Temp 98.2°F | Ht 63.5 in | Wt 135.0 lb

## 2022-05-29 DIAGNOSIS — E7849 Other hyperlipidemia: Secondary | ICD-10-CM

## 2022-05-29 DIAGNOSIS — I10 Essential (primary) hypertension: Secondary | ICD-10-CM

## 2022-05-29 DIAGNOSIS — H6992 Unspecified Eustachian tube disorder, left ear: Secondary | ICD-10-CM

## 2022-05-29 DIAGNOSIS — N390 Urinary tract infection, site not specified: Secondary | ICD-10-CM | POA: Diagnosis not present

## 2022-05-29 DIAGNOSIS — N078 Hereditary nephropathy, not elsewhere classified with other morphologic lesions: Secondary | ICD-10-CM

## 2022-05-29 DIAGNOSIS — R7303 Prediabetes: Secondary | ICD-10-CM | POA: Diagnosis not present

## 2022-05-29 DIAGNOSIS — H6982 Other specified disorders of Eustachian tube, left ear: Secondary | ICD-10-CM

## 2022-05-29 HISTORY — DX: Unspecified Eustachian tube disorder, left ear: H69.92

## 2022-05-29 MED ORDER — ESTRADIOL 0.1 MG/GM VA CREA: 1.0000 | TOPICAL_CREAM | VAGINAL | 11 refills | Status: DC

## 2022-05-29 MED ORDER — OLMESARTAN MEDOXOMIL 20 MG PO TABS: 20.0000 mg | ORAL_TABLET | Freq: Every day | ORAL | 3 refills | Status: DC

## 2022-05-29 MED ORDER — ONDANSETRON HCL 4 MG PO TABS: 4.0000 mg | ORAL_TABLET | Freq: Three times a day (TID) | ORAL | 0 refills | Status: DC | PRN

## 2022-05-29 MED ORDER — OLMESARTAN MEDOXOMIL 20 MG PO TABS
20.0000 mg | ORAL_TABLET | Freq: Every day | ORAL | 3 refills | Status: DC
Start: 2022-05-29 — End: 2022-05-29

## 2022-05-29 NOTE — Assessment & Plan Note (Signed)
Chronic Blood pressure well controlled CMP Continue Benicar 20 mg daily

## 2022-05-29 NOTE — Assessment & Plan Note (Signed)
Chronic Check lipid panel  Continue diet control Regular exercise and healthy diet encouraged

## 2022-05-29 NOTE — Assessment & Plan Note (Signed)
Chronic Has seen urology in the past No recent UTI symptoms Continue Estrace vaginal cream twice weekly and cranberry pills

## 2022-05-29 NOTE — Assessment & Plan Note (Addendum)
Chronic Following with nephrology CMP, cbc

## 2022-05-29 NOTE — Assessment & Plan Note (Signed)
Acute Related to recent otitis media Exam normal  Start flonase, sudafed - take daily Discussed more steroids - will hold off for now To see ENT next week

## 2022-05-29 NOTE — Assessment & Plan Note (Signed)
Chronic Check a1c Low sugar / carb diet Stressed regular exercise  

## 2022-06-02 DIAGNOSIS — H6982 Other specified disorders of Eustachian tube, left ear: Secondary | ICD-10-CM | POA: Diagnosis not present

## 2022-06-02 DIAGNOSIS — H903 Sensorineural hearing loss, bilateral: Secondary | ICD-10-CM | POA: Insufficient documentation

## 2022-06-02 HISTORY — DX: Sensorineural hearing loss, bilateral: H90.3

## 2022-06-04 DIAGNOSIS — M5440 Lumbago with sciatica, unspecified side: Secondary | ICD-10-CM | POA: Diagnosis not present

## 2022-07-12 ENCOUNTER — Other Ambulatory Visit: Payer: Self-pay

## 2022-07-12 ENCOUNTER — Ambulatory Visit
Admission: RE | Admit: 2022-07-12 | Discharge: 2022-07-12 | Disposition: A | Discharge status: Home / Self Care | Payer: Medicare Other | Source: Ambulatory Visit | Attending: Internal Medicine | Admitting: Internal Medicine

## 2022-07-12 VITALS — BP 111/70 | HR 73 | Temp 99.0°F | Resp 20 | Ht 63.5 in | Wt 134.0 lb

## 2022-07-12 DIAGNOSIS — N3001 Acute cystitis with hematuria: Secondary | ICD-10-CM | POA: Diagnosis not present

## 2022-07-12 LAB — POCT URINALYSIS DIP (MANUAL ENTRY)
Bilirubin, UA: NEGATIVE
Glucose, UA: NEGATIVE mg/dL
Ketones, POC UA: NEGATIVE mg/dL
Nitrite, UA: NEGATIVE
Protein Ur, POC: >=300 mg/dL — AB
Spec Grav, UA: 1.015 (ref 1.010–1.025)
Urobilinogen, UA: 0.2 E.U./dL
pH, UA: 6 (ref 5.0–8.0)

## 2022-07-12 MED ORDER — DOXYCYCLINE HYCLATE 100 MG PO CAPS: 100.0000 mg | ORAL_CAPSULE | Freq: Two times a day (BID) | ORAL | 0 refills | Status: AC

## 2022-07-12 NOTE — ED Triage Notes (Signed)
Pt presents to Urgent Care with c/o urinary frequency, odor, and "cloudiness" x 2- 3 days.

## 2022-07-12 NOTE — Discharge Instructions (Addendum)
Advised patient to take medication as directed with food to completion.  Encouraged patient to increase daily water intake while taking this medication.  Advised patient we will follow-up with urine culture results once received.  Advised patient if symptoms worsen and/or unresolved please follow-up with PCP or here for further evaluation.

## 2022-07-12 NOTE — ED Provider Notes (Signed)
[facility]    CSN: [id number] Arrival date & time: 07/12/22  0809      History   Chief Complaint Chief Complaint  Patient presents with   Urinary Frequency    HPI Sarah Valentine is a 71 y.o. female.   HPI Very pleasant 71 year old female presents with urinary frequency, urine odor and cloudiness of urine for 2 to 3 days.  PMH significant of BCC, HTN, and frequent UTI.  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   excised from back [facility])    Patient Active Problem List   Diagnosis Date Noted   ETD (Eustachian tube dysfunction), left 05/29/2022   Right elbow pain 03/26/2022   Elbow stiffness, right 03/26/2022   Acute sinusitis 12/11/2021   RUQ discomfort 10/06/2021   Rib pain on right side 10/06/2021   Lumbar radiculopathy 05/09/2019   Frequent UTI 08/29/2018   Prediabetes 10/07/2016   Lung nodule, solitary - benign, no f/u 10/07/2016   Family history of breast cancer 11/25/2015   Nephritis, hereditary 11/14/2014   Incomplete RBBB    Hypertension    Hyperlipidemia    History of melanoma    Migraines    BARRETTS ESOPHAGUS 09/05/2010   PERSONAL HISTORY OF COLONIC POLYPS 09/05/2010    Past Surgical History:  Procedure Laterality Date   ABDOMINAL HYSTERECTOMY  11/03/1991   BACK SURGERY  2020   and in 2021 due to herniated disc   BASAL CELL CARCINOMA EXCISION  08/02/2013   back   CHOLECYSTECTOMY  11/02/2008   COLONOSCOPY  2019   DJ-MAC-suprep(good)-tics/polyps x  5 (TA/SSP)   COLONOSCOPY W/ BIOPSIES AND POLYPECTOMY  12/03/2008   polyp removed   MELANOMA EXCISION Left 11/02/2009   buttock   Perinephtric abscess with peritonitis  11/02/1978   POLYPECTOMY  2019   TA and SSP   SQUAMOUS CELL CARCINOMA EXCISION  01/31/2014   back    OB History   No obstetric history on file.      Home Medications    Prior to Admission medications   Medication Sig Start Date End Date Taking? Authorizing Provider  doxycycline (VIBRAMYCIN) 100 MG capsule Take 1 capsule (100 mg total) by mouth 2 (two) times daily for 7 days. 07/12/22 07/19/22 Yes Eliezer Lofts, FNP  Cholecalciferol (VITAMIN D3) 50 MCG (2000 UT) capsule Take 2,000 Units by mouth daily.    [provider]  Cranberry 1000 MG CAPS Take 500 mg by mouth daily.     [provider]  estradiol (ESTRACE) 0.1 MG/GM vaginal cream Place 1 Applicatorful vaginally 2 (two) times a week. 06/01/22   Binnie Rail, MD  olmesartan (BENICAR) 20 MG tablet Take 1 tablet (20 mg total) by mouth daily. 05/29/22   Binnie Rail, MD  ondansetron (ZOFRAN) 4 MG tablet Take 1 tablet (4 mg total) by mouth every 8 (eight) hours as needed for nausea or vomiting. 05/29/22  Binnie Rail, MD  sodium bicarbonate 650 MG tablet Take 650 mg by mouth 2 (two) times daily. 12/30/20   [provider]    Family History Family History  Problem Relation Age of Onset   Breast cancer Mother 29   Arthritis Mother    Hyperlipidemia Mother    Heart disease Mother    Hypertension Mother    Kidney disease Mother    Diabetes Mother    Arthritis Father    Esophageal cancer Father 58   Congestive Heart Failure Sister    Breast cancer Maternal Aunt        15s   Breast cancer Maternal Aunt        70s   Esophageal cancer Paternal Uncle 55   Colon cancer Maternal Grandmother 78   Colon polyps Maternal Grandmother 78   Breast cancer Maternal Grandmother 72   Arthritis Maternal Grandmother    Hypertension Maternal  Grandmother    Kidney disease Maternal Grandmother    Diabetes Maternal Grandmother    Breast cancer Cousin 59   Breast cancer Cousin        33s   Arthritis Other    Kidney disease Other        [place] nephritis -pt states mostly maternal side of family as the disease   Rectal cancer Neg Hx    Stomach cancer Neg Hx    Pancreatic cancer Neg Hx     Social History Social History   Tobacco Use   Smoking status: Never   Smokeless tobacco: Never  Vaping Use   Vaping Use: Never used  Substance Use Topics   Alcohol use: No    Alcohol/week: 0.0 standard drinks of alcohol   Drug use: No     Allergies   Sulfonamide derivatives   Review of Systems Review of Systems  Genitourinary:  Positive for urgency.  All other systems reviewed and are negative.    Physical Exam Triage Vital Signs ED Triage Vitals  Enc Vitals Group     BP 07/12/22 0825 111/70     Pulse Rate 07/12/22 0825 73     Resp 07/12/22 0825 20     Temp 07/12/22 0825 99 F (37.2 C)     Temp Source 07/12/22 0825 Oral     SpO2 07/12/22 0825 97 %     Weight 07/12/22 0822 134 lb (60.8 kg)     Height 07/12/22 0822 5' 3.5" (1.613 m)     Head Circumference --      Peak Flow --      Pain Score 07/12/22 0821 2     Pain Loc --      Pain Edu? --      Excl. in [place]? --    No data found.  Updated Vital Signs BP 111/70   Pulse 73   Temp 99 F (37.2 C) (Oral)   Resp 20   Ht 5' 3.5" (1.613 m)   Wt 134 lb (60.8 kg)   SpO2 97%   BMI 23.36 kg/m     Physical Exam Vitals and nursing note reviewed.  Constitutional:      Appearance: Normal appearance. She is normal weight.  HENT:     Head: Normocephalic and atraumatic.     Mouth/Throat:     Mouth: Mucous membranes are moist.     Pharynx: Oropharynx is clear.  Eyes:     Extraocular Movements: Extraocular movements intact.     Conjunctiva/sclera: Conjunctivae normal.     Pupils:  Pupils are equal, round, and reactive to light.  Cardiovascular:     Rate and  Rhythm: Normal rate and regular rhythm.     Pulses: Normal pulses.     Heart sounds: Normal heart sounds.  Pulmonary:     Effort: Pulmonary effort is normal.     Breath sounds: Normal breath sounds. No wheezing, rhonchi or rales.  Abdominal:     Tenderness: There is no right CVA tenderness or left CVA tenderness.  Musculoskeletal:        General: Normal range of motion.     Cervical back: Normal range of motion and neck supple.  Skin:    General: Skin is warm and dry.  Neurological:     General: No focal deficit present.     Mental Status: She is alert and oriented to person, place, and time.      UC Treatments / Results  Labs (all labs ordered are listed, but only abnormal results are displayed) Labs Reviewed  POCT URINALYSIS DIP (MANUAL ENTRY) - Abnormal; Notable for the following components:      Result Value   Color, UA light yellow (*)    Clarity, UA cloudy (*)    Blood, UA moderate (*)    Protein Ur, POC >=300 (*)    Leukocytes, UA Large (3+) (*)    All other components within normal limits  URINE CULTURE    EKG   Radiology No results found.  Procedures Procedures (including critical care time)  Medications Ordered in UC Medications - No data to display  Initial Impression / Assessment and Plan / UC Course  I have reviewed the triage vital signs and the nursing notes.  Pertinent labs & imaging results that were available during my care of the patient were reviewed by me and considered in my medical decision making (see chart for details).     MDM: 1.  Acute cystitis with hematuria-Rx'd Doxycycline. Advised patient to take medication as directed with food to completion.  Encouraged patient to increase daily water intake while taking this medication.  Advised patient we will follow-up with urine culture results once received.  Advised patient if symptoms worsen and/or unresolved please follow-up with PCP or here for further evaluation.  Patient discharged  home, hemodynamically stable. Final Clinical Impressions(s) / UC Diagnoses   Final diagnoses:  Acute cystitis with hematuria     Discharge Instructions      Advised patient to take medication as directed with food to completion.  Encouraged patient to increase daily water intake while taking this medication.  Advised patient we will follow-up with urine culture results once received.  Advised patient if symptoms worsen and/or unresolved please follow-up with PCP or here for further evaluation.     ED Prescriptions     Medication Sig Dispense Auth. Provider   doxycycline (VIBRAMYCIN) 100 MG capsule Take 1 capsule (100 mg total) by mouth 2 (two) times daily for 7 days. 14 capsule Eliezer Lofts, FNP      PDMP not reviewed this encounter.   Eliezer Lofts, Rafael Hernandez 07/12/22 [phone]

## 2022-07-13 ENCOUNTER — Ambulatory Visit (INDEPENDENT_AMBULATORY_CARE_PROVIDER_SITE_OTHER): Payer: Medicare Other

## 2022-07-13 DIAGNOSIS — Z0000 Encounter for general adult medical examination without abnormal findings: Secondary | ICD-10-CM | POA: Diagnosis not present

## 2022-07-13 NOTE — Patient Instructions (Signed)
It was great speaking with you today!  Please schedule your next Medicare Wellness Visit with your Nurse Health Advisor in 1 year by calling [phone]. 

## 2022-07-13 NOTE — Progress Notes (Signed)
Subjective:   Sarah Valentine is a 71 y.o. female who presents for Medicare Annual (Subsequent) preventive examination.  I connected with Patty today by telephone and verified that I am speaking with the correct person using two identifiers. I discussed the limitations, risks, security and privacy concerns of performing an evaluation and management service by telephone and the availability of in person appointments. I also discussed with the patient that there may be a patient responsible charge related to this service. The patient expressed understanding and agreed to proceed. Location patient: home Location provider: [place] Persons participating in the visit: Patty and Jillene Bucks, [place].  Time Spent with patient on telephone encounter: 8 minutes  Review of Systems    No ROS. Medicare Wellness Telephone Visit. Additional risk factors are reflected in social history.       Objective:    There were no vitals filed for this visit. There is no height or weight on file to calculate BMI.     07/13/2022    9:11 AM 06/27/2021   10:07 AM 05/10/2019    1:59 PM 07/30/2014    1:39 PM  Advanced Directives  Does Patient Have a Medical Advance Directive? Yes Yes Yes Yes  Type of Advance Directive Living will Living will;Healthcare Power of Attorney  Living will;Healthcare Power of Attorney  Does patient want to make changes to medical advance directive? No - Patient declined No - Patient declined No - Patient declined   Copy of [place] in Chart?  No - copy requested      Current Medications (verified) Outpatient Encounter Medications as of 07/13/2022  Medication Sig   Cholecalciferol (VITAMIN D3) 50 MCG (2000 UT) capsule Take 2,000 Units by mouth daily.   Cranberry 1000 MG CAPS Take 500 mg by mouth daily.    doxycycline (VIBRAMYCIN) 100 MG capsule Take 1 capsule (100 mg total) by mouth 2 (two) times daily for 7 days.   estradiol (ESTRACE) 0.1 MG/GM  vaginal cream Place 1 Applicatorful vaginally 2 (two) times a week.   olmesartan (BENICAR) 20 MG tablet Take 1 tablet (20 mg total) by mouth daily.   ondansetron (ZOFRAN) 4 MG tablet Take 1 tablet (4 mg total) by mouth every 8 (eight) hours as needed for nausea or vomiting.   sodium bicarbonate 650 MG tablet Take 650 mg by mouth 2 (two) times daily.   No facility-administered encounter medications on file as of 07/13/2022.    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   excised from back Delman Cheadle)   Past Surgical History:  Procedure Laterality Date   ABDOMINAL HYSTERECTOMY  11/03/1991   BACK SURGERY  2020   and in 2021 due to herniated disc   BASAL CELL CARCINOMA EXCISION  08/02/2013   back   CHOLECYSTECTOMY  11/02/2008   COLONOSCOPY  2019  DJ-MAC-suprep(good)-tics/polyps x 5 (TA/SSP)   COLONOSCOPY W/ BIOPSIES AND POLYPECTOMY  12/03/2008   polyp removed   MELANOMA EXCISION Left 11/02/2009   buttock   Perinephtric abscess with peritonitis  11/02/1978   POLYPECTOMY  2019   TA and SSP   SQUAMOUS CELL CARCINOMA EXCISION  01/31/2014   back   Family History  Problem Relation Age of Onset   Breast cancer Mother 75   Arthritis Mother    Hyperlipidemia Mother    Heart disease Mother    Hypertension Mother    Kidney disease Mother    Diabetes Mother    Arthritis Father    Esophageal cancer Father 31    Congestive Heart Failure Sister    Breast cancer Maternal Aunt        79s   Breast cancer Maternal Aunt        70s   Esophageal cancer Paternal Uncle 82   Colon cancer Maternal Grandmother 78   Colon polyps Maternal Grandmother 78   Breast cancer Maternal Grandmother 72   Arthritis Maternal Grandmother    Hypertension Maternal Grandmother    Kidney disease Maternal Grandmother    Diabetes Maternal Grandmother    Breast cancer Cousin 100   Breast cancer Cousin        20s   Arthritis Other    Kidney disease Other        [place] nephritis -pt states mostly maternal side of family as the disease   Rectal cancer Neg Hx    Stomach cancer Neg Hx    Pancreatic cancer Neg Hx    Social History   Socioeconomic History   Marital status: Married    Spouse name: Not on file   Number of children: Not on file   Years of education: Not on file   Highest education level: Not on file  Occupational History   Occupation: nurse    Employer: [employer]  Tobacco Use   Smoking status: Never   Smokeless tobacco: Never  Vaping Use   Vaping Use: Never used  Substance and Sexual Activity   Alcohol use: No    Alcohol/week: 0.0 standard drinks of alcohol   Drug use: No   Sexual activity: Not on file  Other Topics Concern   Not on file  Social History Narrative   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   Married, lives with spouse - 3 daughters all in [place] area   [place] to [place] from [place] in 2010   Social Determinants of Health   Financial Resource Strain: [place]  (07/13/2022)   Overall Financial Resource Strain (CARDIA)    Difficulty of Paying Living Expenses: Not hard at all  Food Insecurity: No Food Insecurity (07/13/2022)   Hunger Vital Sign    Worried About Running Out of Food in the Last Year: Never true    Ran Out of Food in the Last Year: Never true  Transportation Needs: No  Transportation Needs (07/13/2022)   PRAPARE - Hydrologist (Medical): No    Lack of Transportation (Non-Medical): No  Physical Activity: Sufficiently Active (07/13/2022)   Exercise Vital Sign    Days of Exercise per Week: 7 days    Minutes of Exercise per Session: 70 min  Stress: No Stress Concern Present (07/13/2022)   [place]    Feeling of  Stress : Not at all  Social Connections: Moderately Isolated (07/13/2022)   Social Connection and Isolation Panel [NHANES]    Frequency of Communication with Friends and Family: More than three times a week    Frequency of Social Gatherings with Friends and Family: More than three times a week    Attends Religious Services: Never    Marine scientist or Organizations: No    Attends Music therapist: Never    Marital Status: Married    Tobacco Counseling Counseling given: Not Answered   Clinical Intake:  Pre-visit preparation completed: Yes  Pain : No/denies pain   Nutritional Risks: None Diabetes: No  How often do you need to have someone help you when you read instructions, pamphlets, or other written materials from your doctor or pharmacy?: 1 - Never What is the last grade level you completed in school?: Associates Degree  Diabetic?no  Interpreter Needed?: No  Information entered by :: Jillene Bucks, CMA   Activities of Daily Living     No data to display          Patient Care Team: Binnie Rail, MD as PCP - General (Internal Medicine) Milus Banister, MD (Gastroenterology) Jerrell Belfast, MD (Otolaryngology) Crissie Reese, MD (Plastic Surgery) Jari Pigg, MD (Dermatology) Neldon Mc, MD (Inactive) (General Surgery) Rosita Fire, MD as Consulting Physician (Nephrology)  Indicate any recent Medical Services you may have received from other than [facility] providers in the past year (date may  be approximate).     Assessment:   This is a routine wellness examination for Sarah Valentine.  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  Patient wears glasses.  Dietary issues and exercise activities discussed:     Goals Addressed             This Visit's Progress    Patient Stated       Patient would just like to continue to stay healthy.        Depression Screen    07/13/2022    9:16 AM 12/15/2021    1:31 PM 06/27/2021   10:05 AM 04/04/2020   10:02 AM 03/31/2019    8:29 AM 09/28/2017   11:29 AM 10/20/2013    8:51 AM  PHQ 2/9 Scores  PHQ - 2 Score 0 0 0 0 0 0 0    Fall Risk    07/13/2022    9:17 AM 12/15/2021    1:30 PM 06/18/2021    9:41 AM 04/04/2020   10:02 AM 03/31/2019    8:29 AM  Fall Risk   Falls in the past year? 0 1 0 0 0  Number falls in past yr: 0 1 0 0 0  Injury with Fall? 0 0 0 0   Risk for fall due to : No Fall Risks No Fall Risks No Fall Risks    Follow up Falls evaluation completed Falls evaluation completed Falls evaluation completed Falls evaluation completed     [place]:  Any stairs in or around the home? Yes  If so, are there any without handrails? No  Home free of loose throw rugs in walkways, pet beds, electrical cords, etc? Yes  Adequate lighting in your home to reduce risk of falls? Yes   ASSISTIVE DEVICES UTILIZED TO PREVENT FALLS:  Life alert? No  Use of a cane, walker or w/c? No  Grab bars in the bathroom? No  Shower chair or bench in shower? Yes  Elevated toilet seat or a handicapped toilet? No   TIMED UP AND GO:  Was the test performed? No .  Length of time to ambulate 10 feet: n/a sec.   Patient stated that she has no issues with gait or balance; does not use any assistive devices.  Cognitive Function:    03/31/2019    8:54 AM  MMSE - Mini Mental State Exam  Orientation to time 5  Orientation to Place 5  Registration 3  Attention/  Calculation 5  Recall 3  Language- name 2 objects 2  Language- repeat 1  Language- follow 3 step command 3  Language- read & follow direction 1  Write a sentence 1  Copy design 1  Total score 30        07/13/2022    9:18 AM  6CIT Screen  What Year? 0 points  What month? 0 points  What time? 0 points  Count back from 20 0 points  Months in reverse 0 points  Repeat phrase 0 points  Total Score 0 points    Immunizations Immunization History  Administered Date(s) Administered   Influenza Split 09/28/2011   Influenza, High Dose Seasonal PF 08/11/2017, 09/09/2018   Influenza, Seasonal, Injecte, Preservative Fre 10/21/2012   Influenza,inj,Quad PF,6+ Mos 08/31/2013, 08/24/2014   Influenza-Unspecified 08/03/2015, 09/08/2016, 08/11/2017   PFIZER(Purple Top)SARS-COV-2 Vaccination 11/28/2019, 12/19/2019   Pneumococcal Conjugate-13 11/14/2014   Pneumococcal Polysaccharide-23 03/31/2019   Td 11/02/2005   Tdap 12/05/2015   Zoster Recombinat (Shingrix) 06/01/2018, 08/05/2018   Zoster, Live 10/21/2012    TDAP status: Up to date  Flu Vaccine status: Due, Education has been provided regarding the importance of this vaccine. Advised may receive this vaccine at local pharmacy or Health Dept. Aware to provide a copy of the vaccination record if obtained from local pharmacy or Health Dept. Verbalized acceptance and understanding.  Pneumococcal vaccine status: Up to date  Covid-19 vaccine status: Completed vaccines  Qualifies for Shingles Vaccine? Yes   Zostavax completed No   Shingrix Completed?: Yes  Screening Tests Health Maintenance  Topic Date Due   COVID-19 Vaccine (3 - Pfizer risk series) 07/29/2022 (Originally 01/16/2020)   INFLUENZA VACCINE  01/31/2023 (Originally 06/02/2022)   MAMMOGRAM  08/05/2023   DEXA SCAN  06/22/2024   TETANUS/TDAP  12/04/2025   COLONOSCOPY (Pts 45-69yr Insurance coverage will need to be confirmed)  10/03/2026   Pneumonia Vaccine 70 Years old   Completed   Hepatitis C Screening  Completed   Zoster Vaccines- Shingrix  Completed   HPV VACCINES  Aged Out    Health Maintenance  There are no preventive care reminders to display for this patient.   Colorectal cancer screening: Type of screening: Colonoscopy. Completed 10/03/2021. Repeat every 5 years  Mammogram status: Ordered 08/10/22. Pt provided with contact info and advised to call to schedule appt.   Bone Density status: Completed 06/23/2019. Results reflect: Bone density results: NORMAL. Repeat every 5 years.  Lung Cancer Screening: (Low Dose CT Chest recommended if Age 70-80years, 30 pack-year currently smoking OR have quit w/in 15years.) does not qualify.   Lung Cancer Screening Referral: n/a  Additional Screening:  Hepatitis C Screening: does qualify; Completed 11/25/2015  Vision Screening: Recommended annual ophthalmology exams for early detection of glaucoma and other disorders of the eye. Is the patient up to date with their annual eye exam?  Yes  Who is the provider or what is the name of the office in which  the patient attends annual eye exams? [facility] If pt is not established with a provider, would they like to be referred to a provider to establish care? No .   Dental Screening: Recommended annual dental exams for proper oral hygiene  Community Resource Referral / Chronic Care Management: CRR required this visit?  No   CCM required this visit?  No      Plan:     I have personally reviewed and noted the following in the patient's chart:   Medical and social history Use of alcohol, tobacco or illicit drugs  Current medications and supplements including opioid prescriptions. Patient is not currently taking opioid prescriptions. Functional ability and status Nutritional status Physical activity Advanced directives List of other physicians Hospitalizations, surgeries, and ER visits in previous 12 months Vitals Screenings to include cognitive,  depression, and falls Referrals and appointments  In addition, I have reviewed and discussed with patient certain preventive protocols, quality metrics, and best practice recommendations. A written personalized care plan for preventive services as well as general preventive health recommendations were provided to patient.     Rossie Muskrat, [place]   07/13/2022   Nurse Notes:  Patient voiced that she has plans to get her flu vaccine done at [facility].

## 2022-07-14 LAB — URINE CULTURE: Culture: >=100000 — AB

## 2022-07-15 ENCOUNTER — Telehealth (HOSPITAL_COMMUNITY): Payer: Self-pay | Admitting: Emergency Medicine

## 2022-07-15 MED ORDER — NITROFURANTOIN MONOHYD MACRO 100 MG PO CAPS: 100.0000 mg | ORAL_CAPSULE | Freq: Two times a day (BID) | ORAL | 0 refills | Status: DC

## 2022-07-16 ENCOUNTER — Telehealth: Payer: Self-pay | Admitting: Family Medicine

## 2022-07-16 MED ORDER — CIPROFLOXACIN HCL 250 MG PO TABS: 250.0000 mg | ORAL_TABLET | Freq: Two times a day (BID) | ORAL | 0 refills | Status: AC

## 2022-07-16 NOTE — Telephone Encounter (Signed)
Patient presented to our front desk this morning with request for new antibiotic.  Patient was initially prescribed doxycycline on Sunday for acute cystitis with hematuria, urine culture revealed E. coli, thus patient antibiotic switched to [place]; however,  patient cannot take this given most recent GFR.  Advised patient to discontinue Macrobid and start Cipro which has now been sent to requested pharmacy.  Patient agreed and verbalized understanding of these instructions and this plan of care today.

## 2022-07-17 DIAGNOSIS — N184 Chronic kidney disease, stage 4 (severe): Secondary | ICD-10-CM | POA: Diagnosis not present

## 2022-07-21 DIAGNOSIS — N2581 Secondary hyperparathyroidism of renal origin: Secondary | ICD-10-CM | POA: Diagnosis not present

## 2022-07-21 DIAGNOSIS — D631 Anemia in chronic kidney disease: Secondary | ICD-10-CM | POA: Diagnosis not present

## 2022-07-21 DIAGNOSIS — R809 Proteinuria, unspecified: Secondary | ICD-10-CM | POA: Diagnosis not present

## 2022-07-21 DIAGNOSIS — N184 Chronic kidney disease, stage 4 (severe): Secondary | ICD-10-CM | POA: Diagnosis not present

## 2022-07-21 DIAGNOSIS — E559 Vitamin D deficiency, unspecified: Secondary | ICD-10-CM | POA: Diagnosis not present

## 2022-07-21 DIAGNOSIS — I129 Hypertensive chronic kidney disease with stage 1 through stage 4 chronic kidney disease, or unspecified chronic kidney disease: Secondary | ICD-10-CM | POA: Diagnosis not present

## 2022-07-21 DIAGNOSIS — E872 Acidosis, unspecified: Secondary | ICD-10-CM | POA: Diagnosis not present

## 2022-08-10 ENCOUNTER — Ambulatory Visit
Admission: RE | Admit: 2022-08-10 | Discharge: 2022-08-10 | Disposition: A | Discharge status: Home / Self Care | Payer: Medicare Other | Source: Ambulatory Visit

## 2022-08-10 DIAGNOSIS — Z1231 Encounter for screening mammogram for malignant neoplasm of breast: Secondary | ICD-10-CM

## 2022-09-02 DIAGNOSIS — N952 Postmenopausal atrophic vaginitis: Secondary | ICD-10-CM | POA: Diagnosis not present

## 2022-09-02 DIAGNOSIS — N3020 Other chronic cystitis without hematuria: Secondary | ICD-10-CM | POA: Diagnosis not present

## 2022-09-03 DIAGNOSIS — R202 Paresthesia of skin: Secondary | ICD-10-CM | POA: Diagnosis not present

## 2022-09-03 DIAGNOSIS — R200 Anesthesia of skin: Secondary | ICD-10-CM | POA: Diagnosis not present

## 2022-11-15 ENCOUNTER — Ambulatory Visit
Admission: RE | Admit: 2022-11-15 | Discharge: 2022-11-15 | Disposition: A | Discharge status: Home / Self Care | Payer: Medicare Other | Source: Ambulatory Visit | Attending: Family Medicine | Admitting: Family Medicine

## 2022-11-15 ENCOUNTER — Other Ambulatory Visit: Payer: Self-pay

## 2022-11-15 VITALS — BP 104/80 | HR 89 | Temp 99.2°F | Resp 14 | Ht 63.5 in | Wt 135.0 lb

## 2022-11-15 DIAGNOSIS — J0100 Acute maxillary sinusitis, unspecified: Secondary | ICD-10-CM

## 2022-11-15 DIAGNOSIS — J309 Allergic rhinitis, unspecified: Secondary | ICD-10-CM

## 2022-11-15 DIAGNOSIS — H6692 Otitis media, unspecified, left ear: Secondary | ICD-10-CM

## 2022-11-15 DIAGNOSIS — J3489 Other specified disorders of nose and nasal sinuses: Secondary | ICD-10-CM | POA: Diagnosis not present

## 2022-11-15 MED ORDER — AMOXICILLIN-POT CLAVULANATE 875-125 MG PO TABS: 1.0000 | ORAL_TABLET | Freq: Two times a day (BID) | ORAL | 0 refills | Status: DC

## 2022-11-15 MED ORDER — FEXOFENADINE HCL 180 MG PO TABS: 180.0000 mg | ORAL_TABLET | Freq: Every day | ORAL | 0 refills | Status: DC

## 2022-11-15 MED ORDER — PREDNISONE 20 MG PO TABS: ORAL_TABLET | ORAL | 0 refills | Status: DC

## 2022-11-15 NOTE — Discharge Instructions (Addendum)
Instructed patient to take medication as directed with food to completion.  Advised patient to take Allegra and prednisone with first dose of Augmentin for the next 5 of 10 days.  Advised may use Allegra as needed afterwards for concurrent runny nose/postnasal drip/drainage.  Encourage patient to increase daily water intake to 64 ounces per day while taking these medications.  Advised if symptoms worsen and/or unresolved please follow-up PCP or here for further evaluation.

## 2022-11-15 NOTE — ED Triage Notes (Signed)
Sinus pressure and left ear pain started w/ dental pressure started yesterday  Tylenol 1 gm every 6 hours x 24 hours  - min relief  Sudafed helped yesterday Started with cold symptoms 10 days ago  Negative Home COVID test

## 2022-11-15 NOTE — ED Provider Notes (Signed)
[name]    CSN: [id number] Arrival date & time: 11/15/22  7989      History   Chief Complaint Chief Complaint  Patient presents with   Ear Fullness    Ear pain and congestion - Entered by patient    HPI Terianne Thaker is a 72 y.o. female.   HPI Very pleasant 72 year old female presents with sinus pressure and left ear pain for the past several days.  Reports negative home COVID test 19 test.  PMH significant for Barrett's esophagus, HTN, and incomplete right bundle branch block.  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   excised from back [facility])    Patient Active Problem List   Diagnosis Date Noted   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   ETD (Eustachian tube dysfunction), left 05/29/2022   Right elbow pain 03/26/2022   Elbow stiffness, right 03/26/2022   Acute sinusitis 12/11/2021   RUQ discomfort 10/06/2021   Rib pain on right side 10/06/2021   Lumbar radiculopathy 05/09/2019   Frequent UTI 08/29/2018   Prediabetes 10/07/2016   Lung nodule, solitary - benign, no f/u 10/07/2016   Family history of breast cancer 11/25/2015   Nephritis, hereditary 11/14/2014   Incomplete RBBB    Hypertension    Hyperlipidemia    History of melanoma    Migraines    BARRETTS ESOPHAGUS 09/05/2010   PERSONAL HISTORY OF COLONIC POLYPS 09/05/2010    Past Surgical History:  Procedure Laterality Date   ABDOMINAL HYSTERECTOMY  11/03/1991   BACK SURGERY  2020    and in 2021 due to herniated disc   BASAL CELL CARCINOMA EXCISION  08/02/2013   back   CHOLECYSTECTOMY  11/02/2008   COLONOSCOPY  2019   DJ-MAC-suprep(good)-tics/polyps x 5 (TA/SSP)   COLONOSCOPY W/ BIOPSIES AND POLYPECTOMY  12/03/2008   polyp removed   MELANOMA EXCISION Left 11/02/2009   buttock   Perinephtric abscess with peritonitis  11/02/1978   POLYPECTOMY  2019   TA and SSP   SQUAMOUS CELL CARCINOMA EXCISION  01/31/2014   back    OB History   No obstetric history on file.      Home Medications    Prior to Admission medications   Medication Sig Start Date End Date Taking? Authorizing Provider  amoxicillin-clavulanate (AUGMENTIN) 875-125 MG tablet Take 1 tablet by mouth 2 (two) times daily for 10 days. 11/15/22 11/25/22 Yes Eliezer Lofts, FNP  fexofenadine [facility] ALLERGY) 180 MG tablet Take 1 tablet (180 mg total) by mouth daily for 15 days. 11/15/22 11/30/22 Yes Eliezer Lofts, FNP  predniSONE (DELTASONE) 20 MG tablet Take 3 tabs PO daily x 5 days. 11/15/22  Yes Eliezer Lofts, FNP  Cholecalciferol (VITAMIN D3) 50 MCG (2000 UT) capsule Take 2,000 Units by mouth daily.    [provider]  Cranberry 1000 MG CAPS Take 500 mg by mouth daily.     [provider]  estradiol (ESTRACE) 0.1 MG/GM vaginal cream Place 1 Applicatorful vaginally 2 (two) times a week. 06/01/22   Binnie Rail, MD  olmesartan (BENICAR) 20 MG tablet Take 1 tablet (20 mg total) by mouth daily. 05/29/22   Binnie Rail, MD  ondansetron (ZOFRAN) 4 MG tablet Take 1 tablet (4 mg total) by mouth every 8 (eight) hours as needed for nausea or vomiting. Patient not taking: Reported on 11/15/2022 05/29/22   Binnie Rail, MD  sodium bicarbonate 650 MG tablet Take 650 mg by mouth 2 (two) times daily. 12/30/20   [provider]    Family History Family History  Problem Relation Age of Onset   Breast cancer Mother 77   Arthritis Mother    Hyperlipidemia Mother    Heart disease Mother     Hypertension Mother    Kidney disease Mother    Diabetes Mother    Arthritis Father    Esophageal cancer Father 54   Congestive Heart Failure Sister    Breast cancer Maternal Aunt        50s   Breast cancer Maternal Aunt        70s   Esophageal cancer Paternal Uncle 49   Colon cancer Maternal Grandmother 78   Colon polyps Maternal Grandmother 78   Breast cancer Maternal Grandmother 72   Arthritis Maternal Grandmother    Hypertension Maternal Grandmother    Kidney disease Maternal Grandmother    Diabetes Maternal Grandmother    Breast cancer Cousin 31   Breast cancer Cousin        84s   Arthritis Other    Kidney disease Other        [place] nephritis -pt states mostly maternal side of family as the disease   Rectal cancer Neg Hx    Stomach cancer Neg Hx    Pancreatic cancer Neg Hx     Social History Social History   Tobacco Use   Smoking status: Never   Smokeless tobacco: Never  Vaping Use   Vaping Use: Never used  Substance Use Topics   Alcohol use: No    Alcohol/week: 0.0 standard drinks of alcohol   Drug use: No     Allergies   Sulfamethoxazole-trimethoprim and Sulfonamide derivatives   Review of Systems Review of Systems  HENT:  Positive for ear pain.   All other systems reviewed and are negative.    Physical Exam Triage Vital Signs ED Triage Vitals  Enc Vitals Group     BP      Pulse      Resp      Temp      Temp src      SpO2      Weight      Height      Head Circumference      Peak Flow      Pain Score      Pain Loc      Pain Edu?      Excl. in [place]?    No data found.  Updated Vital Signs BP 104/80 (BP Location: Right Arm) Comment: unable to obtain with dinamapp  Pulse 89   Temp 99.2 F (37.3 C) (Oral)   Resp 14   Ht 5' 3.5" (1.613 m)   Wt 135 lb (61.2 kg)   SpO2 95%   BMI 23.54 kg/m   Visual Acuity Right Eye Distance:   Left Eye Distance:   Bilateral Distance:    Right Eye Near:  Left Eye Near:    Bilateral Near:      Physical Exam Vitals and nursing note reviewed.  Constitutional:      Appearance: Normal appearance. She is normal weight.  HENT:     Head: Normocephalic and atraumatic.     Ears:     Comments: Left TM: Erythematous, bulging; moderate eustachian tube dysfunction noted bilaterally    Nose:     Comments: Turbinates are erythematous/edematous with moderate amount of clear rhinorrhea noted    Mouth/Throat:     Mouth: Mucous membranes are moist.     Pharynx: Oropharynx is clear.  Eyes:     Extraocular Movements: Extraocular movements intact.     Conjunctiva/sclera: Conjunctivae normal.     Pupils: Pupils are equal, round, and reactive to light.  Cardiovascular:     Rate and Rhythm: Normal rate and regular rhythm.     Pulses: Normal pulses.     Heart sounds: Normal heart sounds.  Pulmonary:     Effort: Pulmonary effort is normal.     Breath sounds: Normal breath sounds. No wheezing, rhonchi or rales.  Musculoskeletal:        General: Normal range of motion.     Cervical back: Normal range of motion and neck supple.  Skin:    General: Skin is warm and dry.  Neurological:     General: No focal deficit present.     Mental Status: She is alert and oriented to person, place, and time. Mental status is at baseline.      UC Treatments / Results  Labs (all labs ordered are listed, but only abnormal results are displayed) Labs Reviewed - No data to display  EKG   Radiology No results found.  Procedures Procedures (including critical care time)  Medications Ordered in UC Medications - No data to display  Initial Impression / Assessment and Plan / UC Course  I have reviewed the triage vital signs and the nursing notes.  Pertinent labs & imaging results that were available during my care of the patient were reviewed by me and considered in my medical decision making (see chart for details).     MDM: 1.  Acute left otitis media-Rx Augmentin; 2.  Acute maxillary sinusitis,  recurrence not specified-same as 1 Rx'd Augmentin; 3.  Sinus pressure-Rx'd prednisone; 4.  Allergic rhinitis-Rx'd Allegra. Instructed patient to take medication as directed with food to completion.  Advised patient to take Allegra and prednisone with first dose of Augmentin for the next 5 of 10 days.  Advised may use Allegra as needed afterwards for concurrent runny nose/postnasal drip/drainage.  Encouraged patient to increase daily water intake to 64 ounces per day while taking these medications.  Advised if symptoms worsen and/or unresolved please follow-up PCP or here for further evaluation.  Patient discharged home, hemodynamically stable. Final Clinical Impressions(s) / UC Diagnoses   Final diagnoses:  Acute left otitis media  Acute maxillary sinusitis, recurrence not specified  Allergic rhinitis, unspecified seasonality, unspecified trigger  Sinus pressure     Discharge Instructions      Instructed patient to take medication as directed with food to completion.  Advised patient to take Allegra and prednisone with first dose of Augmentin for the next 5 of 10 days.  Advised may use Allegra as needed afterwards for concurrent runny nose/postnasal drip/drainage.  Encourage patient to increase daily water intake to 64 ounces per day while taking these medications.  Advised if symptoms worsen and/or unresolved please follow-up PCP or here  for further evaluation.     ED Prescriptions     Medication Sig Dispense Auth. Provider   amoxicillin-clavulanate (AUGMENTIN) 875-125 MG tablet Take 1 tablet by mouth 2 (two) times daily for 10 days. 20 tablet Eliezer Lofts, FNP   predniSONE (DELTASONE) 20 MG tablet Take 3 tabs PO daily x 5 days. 15 tablet Eliezer Lofts, FNP   fexofenadine [facility] ALLERGY) 180 MG tablet Take 1 tablet (180 mg total) by mouth daily for 15 days. 15 tablet Eliezer Lofts, FNP      PDMP not reviewed this encounter.   Eliezer Lofts, [place] 11/15/22 1021

## 2022-11-16 ENCOUNTER — Telehealth: Payer: Self-pay | Admitting: Emergency Medicine

## 2022-11-16 NOTE — Telephone Encounter (Signed)
Spoke with patient, states that she is still feeling sick but hoping to turn the corner soon.  Will follow up as needed.

## 2022-11-22 ENCOUNTER — Other Ambulatory Visit: Payer: Self-pay

## 2022-11-22 ENCOUNTER — Emergency Department (HOSPITAL_BASED_OUTPATIENT_CLINIC_OR_DEPARTMENT_OTHER)
Admission: EM | Admit: 2022-11-22 | Discharge: 2022-11-22 | Disposition: A | Discharge status: Home / Self Care | Payer: Medicare Other | Attending: Emergency Medicine | Admitting: Emergency Medicine

## 2022-11-22 ENCOUNTER — Encounter (HOSPITAL_BASED_OUTPATIENT_CLINIC_OR_DEPARTMENT_OTHER): Payer: Self-pay | Admitting: Emergency Medicine

## 2022-11-22 ENCOUNTER — Emergency Department (HOSPITAL_BASED_OUTPATIENT_CLINIC_OR_DEPARTMENT_OTHER): Payer: Medicare Other

## 2022-11-22 DIAGNOSIS — I491 Atrial premature depolarization: Secondary | ICD-10-CM

## 2022-11-22 DIAGNOSIS — Z8616 Personal history of COVID-19: Secondary | ICD-10-CM | POA: Diagnosis not present

## 2022-11-22 DIAGNOSIS — I10 Essential (primary) hypertension: Secondary | ICD-10-CM | POA: Insufficient documentation

## 2022-11-22 DIAGNOSIS — R1084 Generalized abdominal pain: Secondary | ICD-10-CM | POA: Diagnosis not present

## 2022-11-22 DIAGNOSIS — R109 Unspecified abdominal pain: Secondary | ICD-10-CM | POA: Diagnosis not present

## 2022-11-22 DIAGNOSIS — Z79899 Other long term (current) drug therapy: Secondary | ICD-10-CM | POA: Insufficient documentation

## 2022-11-22 DIAGNOSIS — N281 Cyst of kidney, acquired: Secondary | ICD-10-CM | POA: Diagnosis not present

## 2022-11-22 LAB — TROPONIN I (HIGH SENSITIVITY): Troponin I (High Sensitivity): 5 ng/L (ref <18)

## 2022-11-22 LAB — COMPREHENSIVE METABOLIC PANEL
ALT: 53 U/L — ABNORMAL HIGH (ref 0–44)
AST: 42 U/L — ABNORMAL HIGH (ref 15–41)
Albumin: 3.2 g/dL — ABNORMAL LOW (ref 3.5–5.0)
Alkaline Phosphatase: 113 U/L (ref 38–126)
Anion gap: 8 (ref 5–15)
BUN: 47 mg/dL — ABNORMAL HIGH (ref 8–23)
CO2: 26 mmol/L (ref 22–32)
Calcium: 8.8 mg/dL — ABNORMAL LOW (ref 8.9–10.3)
Chloride: 107 mmol/L (ref 98–111)
Creatinine, Ser: 2.03 mg/dL — ABNORMAL HIGH (ref 0.44–1.00)
GFR, Estimated: 26 mL/min — ABNORMAL LOW (ref >60)
Glucose, Bld: 104 mg/dL — ABNORMAL HIGH (ref 70–99)
Potassium: 4.7 mmol/L (ref 3.5–5.1)
Sodium: 141 mmol/L (ref 135–145)
Total Bilirubin: 0.6 mg/dL (ref 0.3–1.2)
Total Protein: 6.7 g/dL (ref 6.5–8.1)

## 2022-11-22 LAB — CBC WITH DIFFERENTIAL/PLATELET
Abs Immature Granulocytes: 0.27 10*3/uL — ABNORMAL HIGH (ref 0.00–0.07)
Basophils Absolute: 0 10*3/uL (ref 0.0–0.1)
Basophils Relative: 0 %
Eosinophils Absolute: 0 10*3/uL (ref 0.0–0.5)
Eosinophils Relative: 0 %
HCT: 39.4 % (ref 36.0–46.0)
Hemoglobin: 12.6 g/dL (ref 12.0–15.0)
Immature Granulocytes: 3 %
Lymphocytes Relative: 8 %
Lymphs Abs: 0.9 10*3/uL (ref 0.7–4.0)
MCH: 28 pg (ref 26.0–34.0)
MCHC: 32 g/dL (ref 30.0–36.0)
MCV: 87.6 fL (ref 80.0–100.0)
Monocytes Absolute: 0.7 10*3/uL (ref 0.1–1.0)
Monocytes Relative: 6 %
Neutro Abs: 9 10*3/uL — ABNORMAL HIGH (ref 1.7–7.7)
Neutrophils Relative %: 83 %
Platelets: 354 10*3/uL (ref 150–400)
RBC: 4.5 MIL/uL (ref 3.87–5.11)
RDW: 12.6 % (ref 11.5–15.5)
WBC: 10.9 10*3/uL — ABNORMAL HIGH (ref 4.0–10.5)
nRBC: 0 % (ref 0.0–0.2)

## 2022-11-22 LAB — LIPASE, BLOOD: Lipase: 65 U/L — ABNORMAL HIGH (ref 11–51)

## 2022-11-22 MED ORDER — PANTOPRAZOLE SODIUM 40 MG IV SOLR
40.0000 mg | Freq: Once | INTRAVENOUS | Status: AC
  Administered 2022-11-22: 40 mg via INTRAVENOUS
  Filled 2022-11-22: qty 10

## 2022-11-22 MED ORDER — DICYCLOMINE HCL 10 MG PO CAPS
10.0000 mg | ORAL_CAPSULE | Freq: Once | ORAL | Status: AC
  Administered 2022-11-22: 10 mg via ORAL
  Filled 2022-11-22: qty 1

## 2022-11-22 MED ORDER — DICYCLOMINE HCL 10 MG PO CAPS: 10.0000 mg | ORAL_CAPSULE | Freq: Four times a day (QID) | ORAL | 0 refills | Status: DC | PRN

## 2022-11-22 MED ORDER — SODIUM CHLORIDE 0.9 % IV BOLUS
1000.0000 mL | Freq: Once | INTRAVENOUS | Status: AC
  Administered 2022-11-22: 1000 mL via INTRAVENOUS

## 2022-11-22 MED ORDER — FENTANYL CITRATE PF 50 MCG/ML IJ SOSY
50.0000 ug | PREFILLED_SYRINGE | Freq: Once | INTRAMUSCULAR | Status: AC
  Administered 2022-11-22: 50 ug via INTRAVENOUS
  Filled 2022-11-22: qty 1

## 2022-11-22 NOTE — ED Provider Notes (Signed)
[facility] [place] DEPT [facility] Provider Note: Georgena Spurling, MD, FACEP  CSN: [id number] MRN: [id number] ARRIVAL: 11/22/22 at [place]: [place]  Abdominal Pain   HISTORY OF PRESENT ILLNESS  11/22/22 4:06 AM Sarah Valentine is a 72 y.o. female who was placed on prednisone and Augmentin for ear and sinus infections 1 week ago.  She started having some palpitations (a sensation that her heart was skipping beats) 3 days ago so discontinued the Augmentin.  The prednisone was only given for 5 days and she has stopped that as well.  Yesterday she developed abdominal pain and bloating.  The abdominal pain is diffuse and she rates it as a 6 out of 10.  It is somewhat worse with movement or palpation.  She had 2 episodes of vomiting earlier today.  She has had no diarrhea or constipation.   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   excised from back [facility])    Past Surgical History:  Procedure Laterality Date   ABDOMINAL HYSTERECTOMY  11/03/1991   BACK SURGERY  2020   and in 2021 due to herniated disc   BASAL CELL CARCINOMA EXCISION  08/02/2013   back   CHOLECYSTECTOMY  11/02/2008   COLONOSCOPY  2019   DJ-MAC-suprep(good)-tics/polyps x 5 (TA/SSP)   COLONOSCOPY W/ BIOPSIES AND POLYPECTOMY  12/03/2008   polyp removed   MELANOMA EXCISION Left 11/02/2009   buttock   Perinephtric abscess with peritonitis  11/02/1978   POLYPECTOMY  2019   TA and SSP   SQUAMOUS CELL CARCINOMA EXCISION   01/31/2014   back    Family History  Problem Relation Age of Onset   Breast cancer Mother 37   Arthritis Mother    Hyperlipidemia Mother    Heart disease Mother    Hypertension Mother    Kidney disease Mother    Diabetes Mother    Arthritis Father    Esophageal cancer Father 28   Congestive Heart Failure Sister    Breast cancer Maternal Aunt        44s   Breast cancer Maternal Aunt        70s   Esophageal cancer Paternal Uncle 91   Colon cancer Maternal Grandmother 78   Colon polyps Maternal Grandmother 78   Breast cancer Maternal Grandmother 72   Arthritis Maternal Grandmother    Hypertension Maternal Grandmother    Kidney disease Maternal Grandmother    Diabetes Maternal Grandmother    Breast cancer Cousin 46   Breast cancer Cousin        65s   Arthritis Other    Kidney disease Other        [place] nephritis -pt states mostly maternal side of family as the disease   Rectal cancer Neg Hx    Stomach cancer Neg Hx    Pancreatic cancer Neg Hx     Social History   Tobacco Use  Smoking status: Never   Smokeless tobacco: Never  Vaping Use   Vaping Use: Never used  Substance Use Topics   Alcohol use: No    Alcohol/week: 0.0 standard drinks of alcohol   Drug use: No    Prior to Admission medications   Medication Sig Start Date End Date Taking? Authorizing Provider  dicyclomine (BENTYL) 10 MG capsule Take 1 capsule (10 mg total) by mouth every 6 (six) hours as needed (For abdominal cramping). 11/22/22  Yes Keniya Schlotterbeck, MD  Cholecalciferol (VITAMIN D3) 50 MCG (2000 UT) capsule Take 2,000 Units by mouth daily.    [provider]  Cranberry 1000 MG CAPS Take 500 mg by mouth daily.     [provider]  estradiol (ESTRACE) 0.1 MG/GM vaginal cream Place 1 Applicatorful vaginally 2 (two) times a week. 06/01/22   Binnie Rail, MD  fexofenadine (ALLEGRA ALLERGY) 180 MG tablet Take 1 tablet (180 mg total) by mouth daily for 15 days. 11/15/22 11/30/22  Eliezer Lofts, FNP  olmesartan (BENICAR) 20 MG tablet Take 1 tablet (20 mg total) by mouth daily. 05/29/22   Binnie Rail, MD  sodium bicarbonate 650 MG tablet Take 650 mg by mouth 2 (two) times daily. 12/30/20   [provider]    Allergies Sulfamethoxazole-trimethoprim and Sulfonamide derivatives   REVIEW OF SYSTEMS  Negative except as noted here or in the History of Present Illness.   PHYSICAL EXAMINATION  Initial Vital Signs Blood pressure 130/82, pulse 80, temperature 98.2 F (36.8 C), temperature source Oral, resp. rate 19, height 5' 3.5" (1.613 m), weight 61.2 kg, SpO2 95 %.  Examination General: Well-developed, well-nourished female in no acute distress; appearance consistent with age of record HENT: normocephalic; atraumatic; TMs normal Eyes: pupils equal, round and reactive to light; extraocular muscles intact Neck: supple Heart: regular rate and rhythm; frequent PACs Lungs: clear to auscultation bilaterally Abdomen: soft; distended; diffusely tender; bowel sounds present Extremities: No deformity; full range of motion; pulses normal Neurologic: Awake, alert and oriented; motor function intact in all extremities and symmetric; no facial droop Skin: Warm and dry Psychiatric: Normal mood and affect   RESULTS  Summary of this visit's results, reviewed and interpreted by myself:   EKG Interpretation  Date/Time:  Sunday November 22 2022 03:40:13 EST Ventricular Rate:  82 PR Interval:  132 QRS Duration: 96 QT Interval:  379 QTC Calculation: 443 R Axis:   -54 Text Interpretation: Sinus rhythm LAD, consider left anterior fascicular block Consider right ventricular hypertrophy No previous ECGs available Confirmed by Shanon Rosser [phone]) on 11/22/2022 4:07:39 AM       Laboratory Studies: Results for orders placed or performed during the hospital encounter of 11/22/22 (from the past 24 hour(s))  CBC with Differential     Status: Abnormal   Collection Time: 11/22/22   4:58 AM  Result Value Ref Range   WBC 10.9 (H) 4.0 - 10.5 K/uL   RBC 4.50 3.87 - 5.11 MIL/uL   Hemoglobin 12.6 12.0 - 15.0 g/dL   HCT 39.4 36.0 - 46.0 %   MCV 87.6 80.0 - 100.0 fL   MCH 28.0 26.0 - 34.0 pg   MCHC 32.0 30.0 - 36.0 g/dL   RDW 12.6 11.5 - 15.5 %   Platelets 354 150 - 400 K/uL   nRBC 0.0 0.0 - 0.2 %   Neutrophils Relative % 83 %   Neutro Abs 9.0 (H) 1.7 - 7.7 K/uL   Lymphocytes Relative 8 %   Lymphs  Abs 0.9 0.7 - 4.0 K/uL   Monocytes Relative 6 %   Monocytes Absolute 0.7 0.1 - 1.0 K/uL   Eosinophils Relative 0 %   Eosinophils Absolute 0.0 0.0 - 0.5 K/uL   Basophils Relative 0 %   Basophils Absolute 0.0 0.0 - 0.1 K/uL   Immature Granulocytes 3 %   Abs Immature Granulocytes 0.27 (H) 0.00 - 0.07 K/uL  Comprehensive metabolic panel     Status: Abnormal   Collection Time: 11/22/22  4:58 AM  Result Value Ref Range   Sodium 141 135 - 145 mmol/L   Potassium 4.7 3.5 - 5.1 mmol/L   Chloride 107 98 - 111 mmol/L   CO2 26 22 - 32 mmol/L   Glucose, Bld 104 (H) 70 - 99 mg/dL   BUN 47 (H) 8 - 23 mg/dL   Creatinine, Ser 2.03 (H) 0.44 - 1.00 mg/dL   Calcium 8.8 (L) 8.9 - 10.3 mg/dL   Total Protein 6.7 6.5 - 8.1 g/dL   Albumin 3.2 (L) 3.5 - 5.0 g/dL   AST 42 (H) 15 - 41 U/L   ALT 53 (H) 0 - 44 U/L   Alkaline Phosphatase 113 38 - 126 U/L   Total Bilirubin 0.6 0.3 - 1.2 mg/dL   GFR, Estimated 26 (L) >60 mL/min   Anion gap 8 5 - 15  Lipase, blood     Status: Abnormal   Collection Time: 11/22/22  4:58 AM  Result Value Ref Range   Lipase 65 (H) 11 - 51 U/L  Troponin I (High Sensitivity)     Status: None   Collection Time: 11/22/22  4:58 AM  Result Value Ref Range   Troponin I (High Sensitivity) 5 <18 ng/L   Imaging Studies: CT ABDOMEN PELVIS WO CONTRAST  Result Date: 11/22/2022 CLINICAL DATA:  Acute, nonlocalized abdominal pain. Abdominal bloating EXAM: CT ABDOMEN AND PELVIS WITHOUT CONTRAST TECHNIQUE: Multidetector CT imaging of the abdomen and pelvis was performed  following the standard protocol without IV contrast. RADIATION DOSE REDUCTION: This exam was performed according to the departmental dose-optimization program which includes automated exposure control, adjustment of the mA and/or kV according to patient size and/or use of iterative reconstruction technique. COMPARISON:  12/24/2014 FINDINGS: Lower chest:  No acute finding Hepatobiliary: No focal liver abnormality.Cholecystectomy. No bile duct dilatation. Pancreas: Unremarkable. Spleen: Unremarkable. Adrenals/Urinary Tract: Negative adrenals. No hydronephrosis or stone. Bilateral renal atrophy. ~2 cm simple cystic density in the bilateral renal cortex. 9 mm high-density nodule at the upper pole left kidney with features of proteinaceous cyst by CT and lumbar MRI in 2020. No follow-up imaging is recommended. Unremarkable bladder. Stomach/Bowel: No obstruction. No appendicitis or other visible bowel inflammation. Vascular/Lymphatic: No acute vascular abnormality. Mild scattered atheromatous calcification. No mass or adenopathy. Reproductive:Hysterectomy. Other: Trace pelvic fluid which is low-density with no visible source. Musculoskeletal: No acute abnormalities. Ordinary, generalized lumbar spine degeneration. IMPRESSION: No acute finding or specific cause for symptoms. No bowel obstruction or visible inflammation. No hydronephrosis or nephrolithiasis. Electronically Signed   By: Jorje Guild M.D.   On: 11/22/2022 04:50    ED COURSE and MDM  Nursing notes, initial and subsequent vitals signs, including pulse oximetry, reviewed and interpreted by myself.  Vitals:   11/22/22 0341 11/22/22 0345 11/22/22 0359 11/22/22 0415  BP:  130/82  124/81  Pulse:  80  80  Resp:  19  (!) 24  Temp:   98.2 F (36.8 C)   TempSrc:   Oral   SpO2:  95%  97%  Weight: 61.2 kg     Height: 5' 3.5" (1.613 m)      Medications  sodium chloride 0.9 % bolus 1,000 mL (has no administration in time range)  fentaNYL (SUBLIMAZE)  injection 50 mcg (50 mcg Intravenous Given 11/22/22 0443)  pantoprazole (PROTONIX) injection 40 mg (40 mg Intravenous Given 11/22/22 0511)  dicyclomine (BENTYL) capsule 10 mg (10 mg Oral Given 11/22/22 0511)   5:33 AM The patient was advised of CT findings and laboratory findings.  We will give her some IV fluids as her BUN and creatinine suggest an element of dehydration.  I suspect her abdominal discomfort and distention are due to the effects of the Augmentin on her bowels.  It is known to cause gastrointestinal upset.  We will trial her on Bentyl, lower dose due to her age.  The only rhythm abnormality seen on her rhythm strip were premature atrial contractions.  These are benign but may be causing her symptoms of skipped beats.  The cause of the slight elevations in her transaminases and lipase is unclear.  There is no CT evidence of pancreatitis or hepatic inflammation.   PROCEDURES  Procedures   ED DIAGNOSES     ICD-10-CM   1. Generalized abdominal pain  R10.84          Shanon Rosser, MD 11/22/22 [phone]

## 2022-11-22 NOTE — ED Triage Notes (Signed)
Chest pressure and palpitations x 3-4 days. Pt recently tx with prednisone and augmentin for ear and sinus infection. Pt also mentions having kidney failure. Pt reports vomiting "a couple times earlier this evening". Reports abd bloating and abd pain. Denies diarrhea.

## 2022-11-27 DIAGNOSIS — Z808 Family history of malignant neoplasm of other organs or systems: Secondary | ICD-10-CM | POA: Diagnosis not present

## 2022-11-27 DIAGNOSIS — L821 Other seborrheic keratosis: Secondary | ICD-10-CM | POA: Diagnosis not present

## 2022-11-27 DIAGNOSIS — D2272 Melanocytic nevi of left lower limb, including hip: Secondary | ICD-10-CM | POA: Diagnosis not present

## 2022-11-27 DIAGNOSIS — Z86018 Personal history of other benign neoplasm: Secondary | ICD-10-CM | POA: Diagnosis not present

## 2022-11-27 DIAGNOSIS — L578 Other skin changes due to chronic exposure to nonionizing radiation: Secondary | ICD-10-CM | POA: Diagnosis not present

## 2022-11-27 DIAGNOSIS — D485 Neoplasm of uncertain behavior of skin: Secondary | ICD-10-CM | POA: Diagnosis not present

## 2022-11-27 DIAGNOSIS — D2271 Melanocytic nevi of right lower limb, including hip: Secondary | ICD-10-CM | POA: Diagnosis not present

## 2022-11-27 DIAGNOSIS — D225 Melanocytic nevi of trunk: Secondary | ICD-10-CM | POA: Diagnosis not present

## 2022-11-27 DIAGNOSIS — L570 Actinic keratosis: Secondary | ICD-10-CM | POA: Diagnosis not present

## 2022-11-27 DIAGNOSIS — D2262 Melanocytic nevi of left upper limb, including shoulder: Secondary | ICD-10-CM | POA: Diagnosis not present

## 2022-11-27 DIAGNOSIS — Z87898 Personal history of other specified conditions: Secondary | ICD-10-CM | POA: Diagnosis not present

## 2022-12-01 DIAGNOSIS — H903 Sensorineural hearing loss, bilateral: Secondary | ICD-10-CM | POA: Diagnosis not present

## 2022-12-16 NOTE — Progress Notes (Unsigned)
    Subjective:    Patient ID: Marcelyn Ditty, female    DOB: [birth year], 72 y.o.   MRN: [id number]      HPI Mirtie is here for No chief complaint on file.   She is here for an acute visit for cold symptoms.   Her symptoms started   She is experiencing   She has tried taking        Medications and allergies reviewed with patient and updated if appropriate.  Current Outpatient Medications on File Prior to Visit  Medication Sig Dispense Refill   Cholecalciferol (VITAMIN D3) 50 MCG (2000 UT) capsule Take 2,000 Units by mouth daily.     Cranberry 1000 MG CAPS Take 500 mg by mouth daily.      dicyclomine (BENTYL) 10 MG capsule Take 1 capsule (10 mg total) by mouth every 6 (six) hours as needed (For abdominal cramping). 20 capsule 0   estradiol (ESTRACE) 0.1 MG/GM vaginal cream Place 1 Applicatorful vaginally 2 (two) times a week. 42.5 g 11   fexofenadine (ALLEGRA ALLERGY) 180 MG tablet Take 1 tablet (180 mg total) by mouth daily for 15 days. 15 tablet 0   olmesartan (BENICAR) 20 MG tablet Take 1 tablet (20 mg total) by mouth daily. 90 tablet 3   sodium bicarbonate 650 MG tablet Take 650 mg by mouth 2 (two) times daily.     No current facility-administered medications on file prior to visit.    Review of Systems     Objective:  There were no vitals filed for this visit. BP Readings from Last 3 Encounters:  11/22/22 121/84  11/15/22 104/80  07/12/22 111/70   Wt Readings from Last 3 Encounters:  11/22/22 135 lb (61.2 kg)  11/15/22 135 lb (61.2 kg)  07/12/22 134 lb (60.8 kg)   There is no height or weight on file to calculate BMI.    Physical Exam Constitutional:      General: She is not in acute distress.    Appearance: Normal appearance. She is not ill-appearing.  HENT:     Head: Normocephalic and atraumatic.     Right Ear: Tympanic membrane, ear canal and external ear normal.     Left Ear: Tympanic membrane, ear canal and external ear normal.      Mouth/Throat:     Mouth: Mucous membranes are moist.     Pharynx: No oropharyngeal exudate or posterior oropharyngeal erythema.  Eyes:     Conjunctiva/sclera: Conjunctivae normal.  Cardiovascular:     Rate and Rhythm: Normal rate and regular rhythm.  Pulmonary:     Effort: Pulmonary effort is normal. No respiratory distress.     Breath sounds: Normal breath sounds. No wheezing or rales.  Musculoskeletal:     Cervical back: Neck supple. No tenderness.  Lymphadenopathy:     Cervical: No cervical adenopathy.  Skin:    General: Skin is warm and dry.  Neurological:     Mental Status: She is alert.            Assessment & Plan:    See Problem List for Assessment and Plan of chronic medical problems.

## 2022-12-16 NOTE — Patient Instructions (Addendum)
        Medications changes include :   tessalon perles for cough, cefdinir - antibiotic.     Return if symptoms worsen or fail to improve.

## 2022-12-17 ENCOUNTER — Encounter: Payer: Self-pay | Admitting: Internal Medicine

## 2022-12-17 ENCOUNTER — Ambulatory Visit (INDEPENDENT_AMBULATORY_CARE_PROVIDER_SITE_OTHER): Payer: Medicare Other | Admitting: Internal Medicine

## 2022-12-17 VITALS — BP 100/82 | HR 94 | Temp 98.2°F | Ht 63.5 in | Wt 136.2 lb

## 2022-12-17 DIAGNOSIS — I10 Essential (primary) hypertension: Secondary | ICD-10-CM | POA: Diagnosis not present

## 2022-12-17 DIAGNOSIS — J069 Acute upper respiratory infection, unspecified: Secondary | ICD-10-CM | POA: Diagnosis not present

## 2022-12-17 MED ORDER — CEFDINIR 300 MG PO CAPS: 300.0000 mg | ORAL_CAPSULE | Freq: Two times a day (BID) | ORAL | 0 refills | Status: AC

## 2022-12-17 MED ORDER — BENZONATATE 200 MG PO CAPS: 200.0000 mg | ORAL_CAPSULE | Freq: Three times a day (TID) | ORAL | 0 refills | Status: DC | PRN

## 2022-12-17 NOTE — Assessment & Plan Note (Signed)
Acute Symptoms likely viral in nature Strep test negative Test for COVID, RSV and flu A/B all negative Advised symptomatic treatment Referrals sent to pharmacy Can take over-the-counter cold medications as needed-Mucinex, nasal sprays I will give her an antibiotic cefdinir 300 mg twice daily x 7 days to use only if her symptoms worsen or not improving since [place].  I know she will not use this unless it is necessary Call with any questions or concerns

## 2022-12-17 NOTE — Assessment & Plan Note (Signed)
Chronic Well-controlled Continue olmesartan 20 mg daily

## 2022-12-29 ENCOUNTER — Ambulatory Visit: Payer: Medicare Other | Attending: Cardiology | Admitting: Cardiology

## 2022-12-29 ENCOUNTER — Encounter: Payer: Self-pay | Admitting: Cardiology

## 2022-12-29 VITALS — BP 122/66 | HR 66 | Ht 63.6 in | Wt 137.1 lb

## 2022-12-29 DIAGNOSIS — R079 Chest pain, unspecified: Secondary | ICD-10-CM | POA: Insufficient documentation

## 2022-12-29 DIAGNOSIS — R011 Cardiac murmur, unspecified: Secondary | ICD-10-CM | POA: Insufficient documentation

## 2022-12-29 DIAGNOSIS — I10 Essential (primary) hypertension: Secondary | ICD-10-CM | POA: Diagnosis not present

## 2022-12-29 DIAGNOSIS — N289 Disorder of kidney and ureter, unspecified: Secondary | ICD-10-CM | POA: Diagnosis not present

## 2022-12-29 DIAGNOSIS — E782 Mixed hyperlipidemia: Secondary | ICD-10-CM | POA: Diagnosis not present

## 2022-12-29 HISTORY — DX: Disorder of kidney and ureter, unspecified: N28.9

## 2022-12-29 HISTORY — DX: Cardiac murmur, unspecified: R01.1

## 2022-12-29 NOTE — Progress Notes (Signed)
Cardiology Office Note:    Date:  12/29/2022   ID:  Sarah Valentine, DOB 08-26-1951, MRN [id number]  PCP:  Binnie Rail, MD  Cardiologist:  Jenean Lindau, MD   Referring MD: Shanon Rosser, MD    ASSESSMENT:    1. Primary hypertension   2. Mixed hyperlipidemia   3. Renal insufficiency   4. Chest pain of uncertain etiology   5. Cardiac murmur    PLAN:    In order of problems listed above:  Aortic atherosclerosis: Secondary prevention stressed with the patient.  Importance of compliance with diet medication stressed and she vocalized understanding. Chest pain: Atypical etiology however in view of risk factors she will undergo exercise stress echo and she is agreeable. Cardiac murmur: Echo recommended.  To assess murmur heard on auscultation. Mixed dyslipidemia: Markedly elevated lipids.  In view of aortic atherosclerosis she will need statin therapy.  Will check lipids and initiate her on statins likely Crestor 10 mg daily.  Diet emphasized.  Benefits risks of medications explained and she vocalized understanding and questions were answered to her satisfaction. Essential hypertension: Blood pressure stable and diet was emphasized. Patient will be seen in follow-up appointment in 6 months or earlier if the patient has any concerns    Medication Adjustments/Labs and Tests Ordered: Current medicines are reviewed at length with the patient today.  Concerns regarding medicines are outlined above.  Orders Placed This Encounter  Procedures   Comprehensive metabolic panel   Lipid panel   EKG 12-Lead   ECHOCARDIOGRAM COMPLETE   ECHOCARDIOGRAM STRESS TEST   No orders of the defined types were placed in this encounter.    History of Present Illness:    Sarah Valentine is a 72 y.o. female who is being seen today for the evaluation of chest pain at the request of Molpus, John, MD. patient is a retired Marine scientist.  She has past medical history of essential hypertension,  dyslipidemia, aortic atherosclerosis and renal insufficiency.  She is an active lady.  She walks 2 miles on a daily basis.  She will occasionally get some history of chest tightness.  No orthopnea or PND.  No radiation to any part of the body.  For this reason she is here for an evaluation.  At the time of my evaluation, the patient is alert awake oriented and in no distress.  Past Medical History:  Diagnosis Date   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   Barrett's esophagus    BCC (basal cell carcinoma), trunk 08/2013   removed from back ([facility])   COVID-19 04/09/2021   Elbow stiffness, right 03/26/2022   ETD (Eustachian tube dysfunction), left 05/29/2022   Family history of breast cancer 11/25/2015   Multiple relatives  3D mammo annually  MRI of breast every other year - has been covered  Does not follow with Gyn  Will get MRI next year - 2018   Frequent UTI 08/29/2018   History of melanoma    Melanoma in situ (L) buttock, dr Delman Cheadle every 6 months   Hx of adenomatous colonic polyps 12/2008   Hyperlipidemia    on meds   Hypertension    on meds   Incomplete RBBB    Lumbar radiculopathy 05/09/2019   Lung nodule, solitary - benign, no f/u 10/07/2016   Ct 10/22/16: Benign-appearing calcified lesion in the left upper lobe. No further follow-up is necessary.   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   excised from back ([place])   URI (upper respiratory infection) 12/17/2022    Past Surgical History:  Procedure Laterality Date   ABDOMINAL HYSTERECTOMY  11/03/1991   BACK SURGERY   2020   and in 2021 due to herniated disc   BASAL CELL CARCINOMA EXCISION  08/02/2013   back   CHOLECYSTECTOMY  11/02/2008   COLONOSCOPY  2019   DJ-MAC-suprep(good)-tics/polyps x 5 (TA/SSP)   COLONOSCOPY W/ BIOPSIES AND POLYPECTOMY  12/03/2008   polyp removed   MELANOMA EXCISION Left 11/02/2009   buttock   Perinephtric abscess with peritonitis  11/02/1978   POLYPECTOMY  2019   TA and SSP   SQUAMOUS CELL CARCINOMA EXCISION  01/31/2014   back    Current Medications: Current Meds  Medication Sig   Cholecalciferol (VITAMIN D3) 50 MCG (2000 UT) capsule Take 2,000 Units by mouth daily.   Cranberry 1000 MG CAPS Take 500 mg by mouth daily.    dicyclomine (BENTYL) 10 MG capsule Take 1 capsule (10 mg total) by mouth every 6 (six) hours as needed (For abdominal cramping).   estradiol (ESTRACE) 0.1 MG/GM vaginal cream Place 1 Applicatorful vaginally 2 (two) times a week.   olmesartan (BENICAR) 20 MG tablet Take 1 tablet (20 mg total) by mouth daily.   sodium bicarbonate 650 MG tablet Take 650 mg by mouth 2 (two) times daily.     Allergies:   Augmentin [amoxicillin-pot clavulanate], Sulfamethoxazole-trimethoprim, and Sulfonamide derivatives   Social History   Socioeconomic History   Marital status: Married    Spouse name: Not on file   Number of children: Not on file   Years of education: Not on file   Highest education level: Not on file  Occupational History   Occupation: nurse    Employer: [employer]  Tobacco Use   Smoking status: Never   Smokeless tobacco: Never  Vaping Use   Vaping Use: Never used  Substance and Sexual Activity   Alcohol use: No    Alcohol/week: 0.0 standard drinks of alcohol   Drug use: No   Sexual activity: Not on file  Other Topics Concern   Not on file  Social History Narrative   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 in the Last Year: Never true  Transportation Needs: No Transportation Needs (07/13/2022)   PRAPARE - Hydrologist (Medical): No    Lack of Transportation (Non-Medical): No  Physical Activity: Sufficiently Active (  07/13/2022)   Exercise Vital Sign    Days of Exercise per Week: 7 days    Minutes of Exercise per Session: 70 min  Stress: No Stress Concern Present (07/13/2022)   [place]    Feeling of Stress : Not at all  Social Connections: Moderately Isolated (07/13/2022)   Social Connection and Isolation Panel [NHANES]    Frequency of Communication with Friends and Family: More than three times a week    Frequency of Social Gatherings with Friends and Family: More than three times a week    Attends Religious Services: Never    Marine scientist or Organizations: No    Attends Music therapist: Never    Marital Status: Married     Family History: The patient's family history includes Arthritis in her father, maternal grandmother, mother, and another family member; Breast cancer in her cousin, maternal aunt, and maternal aunt; Breast cancer (age of onset: 89) in her cousin; Breast cancer (age of onset: 90) in her mother; Breast cancer (age of onset: 48) in her maternal grandmother; Colon cancer (age of onset: 81) in her maternal grandmother; Colon polyps (age of onset: 70) in her maternal grandmother; Congestive Heart Failure in her sister; Diabetes in her maternal  grandmother and mother; Esophageal cancer (age of onset: 19) in her father; Esophageal cancer (age of onset: 55) in her paternal uncle; Heart disease in her mother; Hyperlipidemia in her mother; Hypertension in her maternal grandmother and mother; Kidney disease in her maternal grandmother, mother, and another family member. There is no history of Rectal cancer, Stomach cancer, or Pancreatic cancer.  ROS:   Please see the history of present illness.    All other systems reviewed and are negative.  EKGs/Labs/Other Studies Reviewed:    The following studies were reviewed today: EKG revealed sinus rhythm and nonspecific ST-T changes   Recent Labs: 11/22/2022: ALT 53; BUN 47; Creatinine, Ser 2.03; Hemoglobin 12.6; Platelets 354; Potassium 4.7; Sodium 141  Recent Lipid Panel    Component Value Date/Time   CHOL 230 (H) 06/18/2021 1022   TRIG 147.0 06/18/2021 1022   HDL 53.00 06/18/2021 1022   CHOLHDL 4 06/18/2021 1022   VLDL 29.4 06/18/2021 1022   LDLCALC 148 (H) 06/18/2021 1022   LDLDIRECT 171.3 10/20/2013 0852    Physical Exam:    VS:  BP 122/66   Pulse 66   Ht 5' 3.6" (1.615 m)   Wt 137 lb 1.9 oz (62.2 kg)   SpO2 97%   BMI 23.83 kg/m     Wt Readings from Last 3 Encounters:  12/29/22 137 lb 1.9 oz (62.2 kg)  12/17/22 136 lb 3.2 oz (61.8 kg)  11/22/22 135 lb (61.2 kg)     GEN: Patient is in no acute distress HEENT: Normal NECK: No JVD; No carotid bruits LYMPHATICS: No lymphadenopathy CARDIAC: S1 S2 regular, 2/6 systolic murmur at the apex. RESPIRATORY:  Clear to auscultation without rales, wheezing or rhonchi  ABDOMEN: Soft, non-tender, non-distended MUSCULOSKELETAL:  No edema; No deformity  SKIN: Warm and dry NEUROLOGIC:  Alert and oriented x 3 PSYCHIATRIC:  Normal affect    Signed, Jenean Lindau, MD  12/29/2022 11:11 AM    [place]

## 2022-12-29 NOTE — Patient Instructions (Addendum)
Medication Instructions:  Your physician recommends that you continue on your current medications as directed. Please refer to the Current Medication list given to you today.  *If you need a refill on your cardiac medications before your next appointment, please call your pharmacy*   Lab Work: Your physician recommends that you return for lab work in: the next few days for CMP and lipids. You need to have labs done when you are fasting. [facility] lab is located on the 3rd floor, [address]. Hours are Monday - Friday 8 am to 4 pm, closed 11:30 am to 1:00 pm. You do NOT need an appointment.   If you have labs (blood work) drawn today and your tests are completely normal, you will receive your results only by: [place] (if you have MyChart) OR A paper copy in the mail If you have any lab test that is abnormal or we need to change your treatment, we will call you to review the results.   Testing/Procedures:      Stress Echocardiogram Information Sheet                                                      Instructions:    1. You may take your morning medications the morning of the test  2. Light breakfast.  3. Dress prepared to exercise.  4. DO NOT use ANY caffeine or tobacco products 3 hours before appointment.  5. Please bring all current prescription medications.  Your physician has requested that you have an echocardiogram. Echocardiography is a painless test that uses sound waves to create images of your heart. It provides your doctor with information about the size and shape of your heart and how well your heart's chambers and valves are working. This procedure takes approximately one hour. There are no restrictions for this procedure. Please do NOT wear cologne, perfume, aftershave, or lotions (deodorant is allowed). Please arrive 15 minutes prior to your appointment time.   Follow-Up: At [facility], you and your health needs are our priority.  As part of our  continuing mission to provide you with exceptional heart care, we have created designated Provider Care Teams.  These Care Teams include your primary Cardiologist (physician) and Advanced Practice Providers (APPs -  Physician Assistants and Nurse Practitioners) who all work together to provide you with the care you need, when you need it.  We recommend signing up for the patient portal called "MyChart".  Sign up information is provided on this After Visit Summary.  MyChart is used to connect with patients for Virtual Visits (Telemedicine).  Patients are able to view lab/test results, encounter notes, upcoming appointments, etc.  Non-urgent messages can be sent to your provider as well.   To learn more about what you can do with MyChart, go to [email or web address].    Your next appointment:   9 month(s)  The format for your next appointment:   In Person  Provider:   Jyl Heinz, MD   Other Instructions Exercise Stress Echocardiogram An exercise stress echocardiogram is a test to check how well your heart is working. This test uses sound waves and a computer to make pictures of your heart. These pictures will be taken before and after you exercise. For this test, you will walk on a treadmill or ride a bicycle to make your  heart beat faster. While you exercise, your heart will be checked with an electrocardiogram (ECG). Your blood pressure will also be checked. You may have this test if: You have chest pain or a heart problem. You had a heart attack or heart surgery not long ago. You have heart valve problems. You have a condition that causes narrowing of the blood vessels that supply your heart. You have a high risk of heart disease and: You are starting a new exercise program. You need to have a big surgery. Tell a doctor about: Any allergies you have. All medicines you are taking. This includes vitamins, herbs, eye drops, creams, and over-the-counter medicines. Any problems you or  family members have had with medicines that make you fall asleep (anesthetic medicines). Any surgeries you have had. Any blood disorders you have. Any medical conditions you have. Whether you are pregnant or may be pregnant. What are the risks? Generally, this is a safe test. However, problems may occur, including: Chest pain. Feeling dizzy or light-headed. Shortness of breath. Increased or irregular heartbeat. Feeling like you may vomit (nausea) or vomiting. Heart attack. This is very rare. What happens before the test? Medicines Ask your doctor about changing or stopping your normal medicines. This is important if you take diabetes medicines or blood thinners. If you use an inhaler, bring it to the test. General instructions Wear comfortable clothes and walking shoes. Follow instructions from your doctor about what you cannot eat or drink before the test. Do not drink or eat anything that has caffeine in it. Stop having caffeine 24 hours before the test. Do not smoke or use products that contain nicotine or tobacco for 4 hours before the test. If you need help quitting, ask your doctor. What happens during the test?  You will take off your clothes from the waist up and put on a hospital gown. Electrodes or patches will be put on your chest. A blood pressure cuff will be put on your arm. Before you exercise, a computer will make a picture of your heart. To do this: You will lie down and a gel will be put on your chest. A wand will be moved over the gel. Sound waves from the wand will go to the computer to make the picture. Then, you will start to exercise. You may walk on a treadmill or pedal a bicycle. Your blood pressure and heart rhythm will be checked while you exercise. The exercise will get harder or faster. You will exercise until: Your heart reaches a certain level. You are too tired to go on. You cannot go on because of chest pain, weakness, or dizziness. You will lie  down right away so another picture of your heart can be taken. The procedure may vary among doctors and hospitals. What can I expect after the test? After your test, it is common to have: Mild soreness. Mild tiredness. Your heart rate and blood pressure will be checked until they return to your normal levels. You should not have any new symptoms after this test. Follow these instructions at home: If your doctor says that you can, you may: Eat what you normally eat. Do your normal activities. Take over-the-counter and prescription medicines only as told by your doctor. Keep all follow-up visits. It is up to you to get the results of your test. Ask how to get your results when they are ready. Contact a doctor if: You feel dizzy or light-headed. You have a fast or irregular heartbeat. You  feel like you may vomit or you vomit. You have a headache. You feel short of breath. Get help right away if: You develop pain or pressure: In your chest. In your jaw or neck. Between your shoulders. That goes down your left arm. You faint. You have trouble breathing. These symptoms may be an emergency. Get medical help right away. Call your local emergency services (911 in the [place] Do not wait to see if the symptoms will go away. Do not drive yourself to the hospital. Summary This is a test that checks how well your heart is working. Follow instructions about what you cannot eat or drink before the test. Ask your doctor if you should take your normal medicines before the test. Stop having caffeine 24 hours before the test. Do not smoke or use products with nicotine or tobacco in them for 4 hours before the test. During the test, your blood pressure and heart rhythm will be checked while you exercise. This information is not intended to replace advice given to you by your health care provider. Make sure you discuss any questions you have with your health care provider. Document Revised:  07/02/2021 Document Reviewed: 06/11/2020 Elsevier Patient Education  2022 [employer].

## 2022-12-31 DIAGNOSIS — N184 Chronic kidney disease, stage 4 (severe): Secondary | ICD-10-CM | POA: Diagnosis not present

## 2022-12-31 DIAGNOSIS — N189 Chronic kidney disease, unspecified: Secondary | ICD-10-CM | POA: Diagnosis not present

## 2023-01-06 DIAGNOSIS — I129 Hypertensive chronic kidney disease with stage 1 through stage 4 chronic kidney disease, or unspecified chronic kidney disease: Secondary | ICD-10-CM | POA: Diagnosis not present

## 2023-01-06 DIAGNOSIS — E559 Vitamin D deficiency, unspecified: Secondary | ICD-10-CM | POA: Diagnosis not present

## 2023-01-06 DIAGNOSIS — E872 Acidosis, unspecified: Secondary | ICD-10-CM | POA: Diagnosis not present

## 2023-01-06 DIAGNOSIS — R809 Proteinuria, unspecified: Secondary | ICD-10-CM | POA: Diagnosis not present

## 2023-01-06 DIAGNOSIS — N2581 Secondary hyperparathyroidism of renal origin: Secondary | ICD-10-CM | POA: Diagnosis not present

## 2023-01-06 DIAGNOSIS — D631 Anemia in chronic kidney disease: Secondary | ICD-10-CM | POA: Diagnosis not present

## 2023-01-06 DIAGNOSIS — N184 Chronic kidney disease, stage 4 (severe): Secondary | ICD-10-CM | POA: Diagnosis not present

## 2023-01-12 DIAGNOSIS — R079 Chest pain, unspecified: Secondary | ICD-10-CM | POA: Diagnosis not present

## 2023-01-12 DIAGNOSIS — E782 Mixed hyperlipidemia: Secondary | ICD-10-CM | POA: Diagnosis not present

## 2023-01-13 ENCOUNTER — Telehealth: Payer: Self-pay

## 2023-01-13 DIAGNOSIS — E782 Mixed hyperlipidemia: Secondary | ICD-10-CM

## 2023-01-13 LAB — COMPREHENSIVE METABOLIC PANEL
ALT: 20 IU/L (ref 0–32)
AST: 24 IU/L (ref 0–40)
Albumin/Globulin Ratio: 1.8 (ref 1.2–2.2)
Albumin: 4.2 g/dL (ref 3.9–4.9)
Alkaline Phosphatase: 134 IU/L — ABNORMAL HIGH (ref 44–121)
BUN/Creatinine Ratio: 19 (ref 12–28)
BUN: 38 mg/dL — ABNORMAL HIGH (ref 8–27)
Bilirubin Total: 0.3 mg/dL (ref 0.0–1.2)
CO2: 22 mmol/L (ref 20–29)
Calcium: 9.6 mg/dL (ref 8.7–10.3)
Chloride: 109 mmol/L — ABNORMAL HIGH (ref 96–106)
Creatinine, Ser: 1.98 mg/dL — ABNORMAL HIGH (ref 0.57–1.00)
Globulin, Total: 2.3 g/dL (ref 1.5–4.5)
Glucose: 100 mg/dL — ABNORMAL HIGH (ref 70–99)
Potassium: 4.8 mmol/L (ref 3.5–5.2)
Sodium: 145 mmol/L — ABNORMAL HIGH (ref 134–144)
Total Protein: 6.5 g/dL (ref 6.0–8.5)
eGFR: 27 mL/min/{1.73_m2} — ABNORMAL LOW (ref >59)

## 2023-01-13 LAB — LIPID PANEL
Chol/HDL Ratio: 4.8 ratio — ABNORMAL HIGH (ref 0.0–4.4)
Cholesterol, Total: 249 mg/dL — ABNORMAL HIGH (ref 100–199)
HDL: 52 mg/dL (ref >39)
LDL Chol Calc (NIH): 156 mg/dL — ABNORMAL HIGH (ref 0–99)
Triglycerides: 224 mg/dL — ABNORMAL HIGH (ref 0–149)
VLDL Cholesterol Cal: 41 mg/dL — ABNORMAL HIGH (ref 5–40)

## 2023-01-13 MED ORDER — ROSUVASTATIN CALCIUM 10 MG PO TABS: 10.0000 mg | ORAL_TABLET | Freq: Every day | ORAL | 3 refills | Status: DC

## 2023-01-13 NOTE — Telephone Encounter (Signed)
-----   Message from Jenean Lindau, MD sent at 01/13/2023 11:26 AM EDT ----- Gae Bon 10mg  daily and LL 6wks cc pcp. Diet also Jenean Lindau, MD 01/13/2023 11:26 AM

## 2023-01-18 ENCOUNTER — Telehealth (HOSPITAL_COMMUNITY): Payer: Self-pay | Admitting: *Deleted

## 2023-01-18 NOTE — Telephone Encounter (Signed)
Instructions given for upcoming stress echo.  Sarah Valentine

## 2023-01-26 ENCOUNTER — Ambulatory Visit (HOSPITAL_BASED_OUTPATIENT_CLINIC_OR_DEPARTMENT_OTHER): Payer: Medicare Other

## 2023-01-26 ENCOUNTER — Ambulatory Visit (HOSPITAL_COMMUNITY): Payer: Medicare Other

## 2023-01-26 ENCOUNTER — Ambulatory Visit (HOSPITAL_COMMUNITY): Payer: Medicare Other | Attending: Cardiology

## 2023-01-26 DIAGNOSIS — R011 Cardiac murmur, unspecified: Secondary | ICD-10-CM | POA: Diagnosis not present

## 2023-01-26 DIAGNOSIS — R079 Chest pain, unspecified: Secondary | ICD-10-CM | POA: Insufficient documentation

## 2023-01-26 MED ORDER — PERFLUTREN LIPID MICROSPHERE
1.0000 mL | INTRAVENOUS | Status: AC | PRN
  Administered 2023-01-26: 8 mL via INTRAVENOUS

## 2023-01-27 LAB — ECHOCARDIOGRAM COMPLETE
Area-P 1/2: 2.6 cm2
S' Lateral: 2.6 cm

## 2023-02-10 ENCOUNTER — Encounter: Payer: Self-pay | Admitting: Internal Medicine

## 2023-02-10 ENCOUNTER — Ambulatory Visit (INDEPENDENT_AMBULATORY_CARE_PROVIDER_SITE_OTHER): Payer: Medicare Other | Admitting: Internal Medicine

## 2023-02-10 VITALS — BP 104/70 | HR 80 | Temp 98.0°F | Ht 63.6 in | Wt 139.0 lb

## 2023-02-10 DIAGNOSIS — S46812A Strain of other muscles, fascia and tendons at shoulder and upper arm level, left arm, initial encounter: Secondary | ICD-10-CM | POA: Insufficient documentation

## 2023-02-10 DIAGNOSIS — I10 Essential (primary) hypertension: Secondary | ICD-10-CM | POA: Diagnosis not present

## 2023-02-10 DIAGNOSIS — N3000 Acute cystitis without hematuria: Secondary | ICD-10-CM | POA: Diagnosis not present

## 2023-02-10 DIAGNOSIS — R300 Dysuria: Secondary | ICD-10-CM | POA: Diagnosis not present

## 2023-02-10 LAB — POC URINALSYSI DIPSTICK (AUTOMATED)
Bilirubin, UA: NEGATIVE
Glucose, UA: NEGATIVE
Ketones, UA: NEGATIVE
Leukocytes, UA: NEGATIVE
Nitrite, UA: POSITIVE
Protein, UA: POSITIVE — AB
Spec Grav, UA: 1.015 (ref 1.010–1.025)
Urobilinogen, UA: 0.2 E.U./dL
pH, UA: 6.5 (ref 5.0–8.0)

## 2023-02-10 MED ORDER — CIPROFLOXACIN HCL 250 MG PO TABS: 250.0000 mg | ORAL_TABLET | Freq: Two times a day (BID) | ORAL | 0 refills | Status: AC

## 2023-02-10 NOTE — Assessment & Plan Note (Signed)
Chronic Well-controlled Continue olmesartan 20 mg daily 

## 2023-02-10 NOTE — Progress Notes (Signed)
Subjective:    Patient ID: Sarah Valentine, female    DOB: 27-Jul-1951, 72 y.o.   MRN: [id number]      HPI Sarah Valentine is here for  Chief Complaint  Patient presents with   Urinary Tract Infection    Frequency, order and slight discomfort after urination   Shoulder Pain    Left shoulder pain    ? UTI:  Her symptoms started 5 days ago.  She states cloudy urine and an odor at times.  Symptoms have been intermittent.  She had one day of dysuria.  She denies urinary frequency, urinary urgency, hematuria, abdominal pain, back pain, nausea, fever.  Her symptoms are typical of her UTI's.  Using estrogen cream every other night.     Left shoulder pain - has pain in left upper back x 9 months - 1 year - now in left side of neck. Massage helps a little.  It feels muscular.  Has tried heating pad, hot tub - never goes away - helps transiently.  Tylenol takes the edge. Has not tried anything topical.  No pain in arm or chest.   No N/T, weakness.       Medications and allergies reviewed with patient and updated if appropriate.  Current Outpatient Medications on File Prior to Visit  Medication Sig Dispense Refill   Cholecalciferol (VITAMIN D3) 50 MCG (2000 UT) capsule Take 2,000 Units by mouth daily.     Cranberry 1000 MG CAPS Take 500 mg by mouth daily.      estradiol (ESTRACE) 0.1 MG/GM vaginal cream Place 1 Applicatorful vaginally 2 (two) times a week. 42.5 g 11   olmesartan (BENICAR) 20 MG tablet Take 1 tablet (20 mg total) by mouth daily. 90 tablet 3   rosuvastatin (CRESTOR) 10 MG tablet Take 1 tablet (10 mg total) by mouth daily. 90 tablet 3   sodium bicarbonate 650 MG tablet Take 650 mg by mouth 2 (two) times daily.     FEROSUL 325 (65 Fe) MG tablet Take 325 mg by mouth daily. (Patient not taking: Reported on 02/10/2023)     No current facility-administered medications on file prior to visit.    Review of Systems     Objective:   Vitals:   02/10/23 0858  BP: 104/70   Pulse: 80  Temp: 98 F (36.7 C)  SpO2: 98%   BP Readings from Last 3 Encounters:  02/10/23 104/70  12/29/22 122/66  12/17/22 100/82   Wt Readings from Last 3 Encounters:  02/10/23 139 lb (63 kg)  12/29/22 137 lb 1.9 oz (62.2 kg)  12/17/22 136 lb 3.2 oz (61.8 kg)   Body mass index is 24.16 kg/m.    Physical Exam Constitutional:      General: She is not in acute distress.    Appearance: Normal appearance. She is not ill-appearing.  HENT:     Head: Normocephalic and atraumatic.  Musculoskeletal:        General: Tenderness (left trapezius muscle by scapula up to neck) present. No swelling or deformity. Normal range of motion.  Skin:    General: Skin is warm and dry.     Findings: No erythema or rash.  Neurological:     Mental Status: She is alert.     Sensory: No sensory deficit.     Motor: No weakness.            Assessment & Plan:    See Problem List for Assessment and Plan of chronic medical problems.

## 2023-02-10 NOTE — Patient Instructions (Addendum)
          Medications changes include :   cipro 250 mg bid x 5 days    Consider seeing sports medicine if your left upper back continues    Return if symptoms worsen or fail to improve.

## 2023-02-10 NOTE — Assessment & Plan Note (Signed)
Acute Urine dip and history consistent with UTI Will send urine for culture Cipro 250 mg bid x 5 days given renal function Take tylenol if needed.   Increase your water intake.  Continue estrace cream Sees urology annually Call if no improvement

## 2023-02-10 NOTE — Assessment & Plan Note (Signed)
New Started 9 month - 1 year ago Initially in left upper back and now in neck No N/T, arm pain, chest pain ? Cervical radiculopathy, vs strain Continue heat, hot tub - can try topical medication, stretching See if any activity is causing it If no change recommended seeing sports medicine for further evaluation

## 2023-02-12 LAB — CULTURE, URINE COMPREHENSIVE

## 2023-03-04 NOTE — Progress Notes (Signed)
   Rubin Payor, PhD, LAT, ATC acting as a scribe for Clementeen Graham, MD.  Sarah Valentine is a 72 y.o. female who presents to [facility] at [facility] today for neck and upper back pain. Pt was previously seen by Dr. Denyse Amass on 04/30/22 for R elbow pain.  Today, pt c/o neck and upper back pain ongoing 10 month to a year. Pt locates pain to left side of neck and periscapular region. Denies night disturbance. Hsband feels knots in her back when massaging. Soaks in hot tub, tried heating pad. Denies HA associated with neck pain.   Radiates: periscapular region UE Numbness/tingling: no UE Weakness: no Aggravates:  Treatments tried: massage, heating pad, hot tub, Tylenol  Pertinent review of systems: No fevers or chills  Relevant historical information: Hypertension   Exam:  BP 118/80   Pulse 86   Ht 5' 3.6" (1.615 m)   Wt 138 lb 12.8 oz (63 kg)   SpO2 97%   BMI 24.13 kg/m  General: Well Developed, well nourished, and in no acute distress.   MSK: C-spine: Normal appearing Nontender to palpation spinal midline. Tender palpation left trapezius and sternocleidomastoid. Cervical motion limited left rotation and left lateral flexion otherwise intact. Upper extremity strength is intact Reflexes are intact.    Lab and Radiology Results  X-ray images cervical spine obtained today personally and independently interpreted Multilevel DDD.  Loss of cervical lordosis indicate spasm.  No acute fractures are visible. Await formal radiology review     Assessment and Plan: 72 y.o. female with left-sided neck pain.  Pain ongoing now for 9 or 10 months without injury.  Pain thought to be due to muscle spasm and dysfunction.  The arthritis changes seen on x-ray today may be a contributory factor but I think the main issue is muscle dysfunction.  Plan to try physical therapy.  Also continue heating pad will add TENS unit and percussive massager. Recheck in 6 weeks. Avoid  high-dose oral NSAIDs given hypertension.  PDMP not reviewed this encounter. Orders Placed This Encounter  Procedures   DG Cervical Spine 2 or 3 views    Standing Status:   Future    Number of Occurrences:   1    Standing Expiration Date:   03/04/2024    Order Specific Question:   Reason for Exam (SYMPTOM  OR DIAGNOSIS REQUIRED)    Answer:   neck pain left    Order Specific Question:   Preferred imaging location?    Answer:   [id number]   Ambulatory referral to Physical Therapy    Referral Priority:   Routine    Referral Type:   Physical Medicine    Referral Reason:   Specialty Services Required    Requested Specialty:   Physical Therapy    Number of Visits Requested:   1   No orders of the defined types were placed in this encounter.    Discussed warning signs or symptoms. Please see discharge instructions. Patient expresses understanding.   The above documentation has been reviewed and is accurate and complete Clementeen Graham, M.D.

## 2023-03-05 ENCOUNTER — Ambulatory Visit (INDEPENDENT_AMBULATORY_CARE_PROVIDER_SITE_OTHER): Payer: Medicare Other | Admitting: Family Medicine

## 2023-03-05 ENCOUNTER — Ambulatory Visit (INDEPENDENT_AMBULATORY_CARE_PROVIDER_SITE_OTHER): Payer: Medicare Other

## 2023-03-05 ENCOUNTER — Encounter: Payer: Self-pay | Admitting: Family Medicine

## 2023-03-05 VITALS — BP 118/80 | HR 86 | Ht 63.6 in | Wt 138.8 lb

## 2023-03-05 DIAGNOSIS — M62838 Other muscle spasm: Secondary | ICD-10-CM

## 2023-03-05 DIAGNOSIS — M542 Cervicalgia: Secondary | ICD-10-CM | POA: Diagnosis not present

## 2023-03-05 DIAGNOSIS — S46812A Strain of other muscles, fascia and tendons at shoulder and upper arm level, left arm, initial encounter: Secondary | ICD-10-CM | POA: Diagnosis not present

## 2023-03-05 NOTE — Patient Instructions (Signed)
Thank you for coming in today.   Try a heating pad and TENS unit.   Try a Medical illustrator.   Plan for PT.   Recheck in 6 weeks unless better.   Let me know if you have any problem.   Please get an Xray today before you leave

## 2023-03-10 NOTE — Progress Notes (Signed)
Cervical spine x-ray does show some arthritis.  No fractures are present.

## 2023-03-16 ENCOUNTER — Encounter: Payer: Self-pay | Admitting: Physical Therapy

## 2023-03-16 ENCOUNTER — Ambulatory Visit: Payer: Medicare Other | Attending: Family Medicine | Admitting: Physical Therapy

## 2023-03-16 ENCOUNTER — Other Ambulatory Visit: Payer: Self-pay

## 2023-03-16 DIAGNOSIS — M62838 Other muscle spasm: Secondary | ICD-10-CM | POA: Insufficient documentation

## 2023-03-16 NOTE — Therapy (Signed)
OUTPATIENT PHYSICAL THERAPY CERVICAL EVALUATION   Patient Name: Sarah Valentine MRN: [id number] DOB:1951/09/06, 72 y.o., female Today's Date: 03/16/2023  END OF SESSION:  PT End of Session - 03/16/23 1140     Visit Number 1    Number of Visits 12    Date for PT Re-Evaluation 04/27/23    Authorization Type Medicare    Authorization - Visit Number 1    Progress Note Due on Visit 10    PT Start Time 1015    PT Stop Time 1057    PT Time Calculation (min) 42 min    Activity Tolerance Patient tolerated treatment well    Behavior During Therapy [facility] for tasks assessed/performed             Past Medical History:  Diagnosis Date   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   Barrett's esophagus    BCC (basal cell carcinoma), trunk 08/2013   removed from back (gould)   COVID-19 04/09/2021   Elbow stiffness, right 03/26/2022   ETD (Eustachian tube dysfunction), left 05/29/2022   Family history of breast cancer 11/25/2015   Multiple relatives  3D mammo annually  MRI of breast every other year - has been covered  Does not follow with Gyn  Will get MRI next year - 2018   Frequent UTI 08/29/2018   History of melanoma    Melanoma in situ (L) buttock, 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 No further follow-up is necessary.   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   excised from back (gould)   URI (upper respiratory infection) 12/17/2022   Past Surgical History:  Procedure Laterality Date   ABDOMINAL HYSTERECTOMY  11/03/1991   BACK SURGERY  2020   and in 2021 due to herniated disc   BASAL CELL CARCINOMA EXCISION  08/02/2013   back   CHOLECYSTECTOMY  11/02/2008   COLONOSCOPY  2019   DJ-MAC-suprep(good)-tics/polyps x 5 (TA/SSP)   COLONOSCOPY W/ BIOPSIES AND POLYPECTOMY  12/03/2008   polyp removed   MELANOMA EXCISION Left 11/02/2009   buttock   Perinephtric abscess with peritonitis  11/02/1978   POLYPECTOMY  2019   TA and SSP   SQUAMOUS CELL CARCINOMA EXCISION  01/31/2014   back   Patient Active Problem List   Diagnosis Date Noted   Trapezius muscle strain, left, initial encounter 02/10/2023   Renal insufficiency 12/29/2022   Chest pain of uncertain etiology 12/29/2022   Cardiac murmur 12/29/2022   URI (upper respiratory infection) 12/17/2022   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   ETD (Eustachian tube dysfunction), left 05/29/2022   Right elbow pain 03/26/2022   Elbow stiffness, right 03/26/2022   RUQ discomfort 10/06/2021  Rib pain on right side 10/06/2021   COVID-19 04/09/2021   Lumbar radiculopathy 05/09/2019   Frequent UTI 08/29/2018   Acute cystitis without hematuria 08/27/2017   Prediabetes 10/07/2016   Lung nodule, solitary - benign, no f/u 10/07/2016   Family history of breast cancer 11/25/2015   Nephritis, hereditary 11/14/2014   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   Incomplete RBBB    BCC (basal cell carcinoma), trunk 08/2013   Hypertension    Hyperlipidemia    History of melanoma    Migraines    BARRETTS ESOPHAGUS 09/05/2010   PERSONAL HISTORY OF COLONIC POLYPS 09/05/2010   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HCC) 2011   Hx of adenomatous colonic polyps 12/2008    PCP: Cheryll Cockayne  REFERRING PROVIDER: Clementeen Graham  REFERRING DIAG: cervical  paraspinal muscle spasm  THERAPY DIAG:  Cervical paraspinal muscle spasm  Rationale for Evaluation and Treatment: Rehabilitation  ONSET DATE: 01/2023  SUBJECTIVE:                                                                                                                                                                                                         SUBJECTIVE STATEMENT: Pt has had pain in her Lt upper trap since September. She states that in the past couple months the pain has started to travel up the Lt side of her neck. Pain increases after sleeping despite trying different pillows and sleeping positions. Pain decreases with massage and hot tub.   PERTINENT HISTORY:  None reported  PAIN:  Are you having pain? Yes: NPRS scale: 3/10 Pain location: Lt neck/upper trap Pain description: core, achey Aggravating factors: sleep Relieving factors: massage, heat  PRECAUTIONS: None  WEIGHT BEARING RESTRICTIONS: No  FALLS:  Has patient fallen in last 6 months? No   OCCUPATION: retired  PLOF: Independent  PATIENT GOALS: decrease pain  NEXT MD VISIT: PRN  OBJECTIVE:   DIAGNOSTIC FINDINGS:  Cervical xray: Degenerative changes. No acute osseous abnormalities.   PATIENT SURVEYS:  FOTO 69  POSTURE: rounded shoulders and forward head  PALPATION: Increased mm spasticity bilat cervical paraspinals, Lt upper trap and levator Hypomobile C5-7 with lateral glides   CERVICAL ROM:   Active ROM A/PROM (deg) eval  Flexion 44  Extension 34  Right lateral flexion 25  Left lateral flexion 35  Right rotation 60  Left rotation 25   (Blank rows = not tested)   UPPER EXTREMITY MMT:  MMT Right eval Left eval  Shoulder flexion 4+ 4+  Shoulder extension    Shoulder abduction 4+ 4+  Shoulder adduction    Shoulder extension    Shoulder internal rotation    Shoulder external rotation    Middle trapezius    Lower trapezius    Elbow flexion    Elbow extension     Wrist flexion    Wrist extension    Wrist ulnar deviation    Wrist radial deviation    Wrist pronation    Wrist supination    Grip strength     (Blank rows = not tested)   TODAY'S TREATMENT:                                                                                                                              [facility] Adult PT Treatment:                                                DATE: 03/16/23 Therapeutic Exercise: See HEP Manual Therapy: STM bilat paraspinals and upper traps Trigger Point Dry-Needling  Treatment instructions: Expect mild to moderate muscle soreness. S/S of pneumothorax if dry needled over a lung field, and to seek immediate medical attention should they occur. Patient verbalized understanding of these instructions and education.  Patient Consent Given: Yes Education handout provided: Yes Muscles treated: bilat paraspinals, Lt upper trap and levator Electrical stimulation performed: No Parameters: N/A Treatment response/outcome: improved muscle length    PATIENT EDUCATION:  Education details: PT POC and goals, dry needling Person educated: Patient Education method: Explanation, Demonstration, and Handouts Education comprehension: verbalized understanding and returned demonstration  HOME EXERCISE PROGRAM: Access Code: [email or web address] URL: [email or web address] Date: 03/16/2023 Prepared by: Reggy Eye  Exercises - Seated Assisted Cervical Rotation with Towel  - 1 x daily - 7 x weekly - 1 sets - 10 reps - 10 seconds hold - Seated Cervical Retraction  - 1 x daily - 7 x weekly - 2 sets - 10 reps - Seated Upper Trapezius Stretch  - 1 x daily - 7 x weekly - 1 sets - 10 reps - 10 seconds hold  ASSESSMENT:  CLINICAL IMPRESSION: Patient is a 72 y.o. female who was seen today for physical therapy evaluation and treatment for cervical paraspinal muscle spasm. Pt presents with decreased ROM, increased pain and increased muscle spasticity. Pt will  benefit from skilled PT to address deficits and improve mobility with decreased pain.   OBJECTIVE IMPAIRMENTS: decreased activity tolerance, decreased ROM, hypomobility, increased muscle spasms, and pain.   ACTIVITY LIMITATIONS: sleeping  PARTICIPATION LIMITATIONS: community activity  PERSONAL FACTORS: Time since onset of injury/illness/exacerbation are also affecting patient's functional outcome.   REHAB POTENTIAL: Good  CLINICAL DECISION MAKING: Stable/uncomplicated  EVALUATION COMPLEXITY: Low   GOALS: Goals reviewed with patient? Yes  SHORT TERM GOALS: Target date: 03/30/2023    Pt will be independent in initial HEP Baseline:  Goal status: INITIAL  LONG TERM GOALS: Target date: 04/27/2023    Pt will be independent with advanced HEP Baseline:  Goal status: INITIAL  2.  Pt will improve FOTO to >= 70 to demo improved functional mobility Baseline:  Goal status: INITIAL  3.  Pt will report neck pain with sleeping <= 1/10 Baseline:  Goal status: INITIAL  4.  Pt will improve LT rotation to >= 50 degrees to ease ability to drive Baseline:  Goal status: INITIAL    PLAN:  PT FREQUENCY: 1-2x/week  PT DURATION: 6 weeks  PLANNED INTERVENTIONS: Therapeutic exercises, Therapeutic activity, Neuromuscular re-education, Balance training, Gait training, Patient/Family education, Self Care, Joint mobilization, Aquatic Therapy, Dry Needling, Electrical stimulation, Cryotherapy, Moist heat, Taping, Traction, Ultrasound, Ionotophoresis 4mg /ml Dexamethasone, Manual therapy, and Re-evaluation  PLAN FOR NEXT SESSION: assess response to dry needling and HEP   Duel Conrad, PT 03/16/2023, 11:41 AM

## 2023-03-20 ENCOUNTER — Other Ambulatory Visit: Payer: Self-pay

## 2023-03-20 ENCOUNTER — Ambulatory Visit
Admission: RE | Admit: 2023-03-20 | Discharge: 2023-03-20 | Disposition: A | Discharge status: Home / Self Care | Payer: Medicare Other | Source: Ambulatory Visit | Attending: Family Medicine | Admitting: Family Medicine

## 2023-03-20 VITALS — BP 111/71 | HR 75 | Temp 98.2°F | Resp 16

## 2023-03-20 DIAGNOSIS — W57XXXA Bitten or stung by nonvenomous insect and other nonvenomous arthropods, initial encounter: Secondary | ICD-10-CM

## 2023-03-20 DIAGNOSIS — S80862A Insect bite (nonvenomous), left lower leg, initial encounter: Secondary | ICD-10-CM | POA: Diagnosis not present

## 2023-03-20 DIAGNOSIS — L03116 Cellulitis of left lower limb: Secondary | ICD-10-CM

## 2023-03-20 MED ORDER — DOXYCYCLINE HYCLATE 100 MG PO CAPS: 100.0000 mg | ORAL_CAPSULE | Freq: Two times a day (BID) | ORAL | 0 refills | Status: AC

## 2023-03-20 NOTE — ED Provider Notes (Signed)
[facility]    CSN: [id number] Arrival date & time: 03/20/23  1018      History   Chief Complaint Chief Complaint  Patient presents with   Insect Bite    Tick, APPT 1030AM    HPI Hydi Batch is a 72 y.o. female.   HPI pleasant 72 year old female presents with left tick bite of left calf that occurred early this morning.  Redness noted of site.  PM H significant for BCC, history of frequent UTI, and HTN.  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 No further follow-up is necessary.   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   excised from back (gould)   URI (upper respiratory infection) 12/17/2022    Patient Active Problem List    Diagnosis Date Noted   Trapezius muscle strain, left, initial encounter 02/10/2023   Renal insufficiency 12/29/2022   Chest pain of uncertain etiology 12/29/2022   Cardiac murmur 12/29/2022   URI (upper respiratory infection) 12/17/2022   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   ETD (Eustachian tube dysfunction), left 05/29/2022   Right elbow pain 03/26/2022   Elbow stiffness, right 03/26/2022   RUQ discomfort 10/06/2021   Rib pain on right side 10/06/2021   COVID-19 04/09/2021   Lumbar radiculopathy 05/09/2019   Frequent UTI 08/29/2018   Acute cystitis without hematuria 08/27/2017   Prediabetes 10/07/2016   Lung nodule, solitary - benign, no f/u 10/07/2016   Family history of breast cancer 11/25/2015   Nephritis, hereditary 11/14/2014   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   Incomplete RBBB    BCC (basal cell carcinoma), trunk 08/2013   Hypertension    Hyperlipidemia    History of melanoma    Migraines    BARRETTS ESOPHAGUS 09/05/2010   PERSONAL HISTORY OF COLONIC POLYPS 09/05/2010   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HCC) 2011   Hx of adenomatous colonic polyps 12/2008    Past Surgical History:  Procedure Laterality Date   ABDOMINAL HYSTERECTOMY  11/03/1991   BACK SURGERY  2020   and in  2021 due to herniated disc   BASAL CELL CARCINOMA EXCISION  08/02/2013   back   CHOLECYSTECTOMY  11/02/2008   COLONOSCOPY  2019   DJ-MAC-suprep(good)-tics/polyps x 5 (TA/SSP)   COLONOSCOPY W/ BIOPSIES AND POLYPECTOMY  12/03/2008   polyp removed   MELANOMA EXCISION Left 11/02/2009   buttock   Perinephtric abscess with peritonitis  11/02/1978   POLYPECTOMY  2019   TA and SSP   SQUAMOUS CELL CARCINOMA EXCISION  01/31/2014   back    OB History   No obstetric history on file.      Home Medications    Prior to Admission medications   Medication Sig Start Date End Date Taking? Authorizing Provider  doxycycline (VIBRAMYCIN) 100 MG capsule Take 1 capsule (100 mg  total) by mouth 2 (two) times daily for 10 days. 03/20/23 03/30/23 Yes Trevor Iha, FNP  Cholecalciferol (VITAMIN D3) 50 MCG (2000 UT) capsule Take 2,000 Units by mouth daily.    [provider]  Cranberry 1000 MG CAPS Take 500 mg by mouth daily.     [provider]  estradiol (ESTRACE) 0.1 MG/GM vaginal cream Place 1 Applicatorful vaginally 2 (two) times a week. 06/01/22   Pincus Sanes, MD  FEROSUL 325 (65 Fe) MG tablet Take 325 mg by mouth daily. 01/07/23   [provider]  olmesartan (BENICAR) 20 MG tablet Take 1 tablet (20 mg total) by mouth daily. 05/29/22   Pincus Sanes, MD  rosuvastatin (CRESTOR) 10 MG tablet Take 1 tablet (10 mg total) by mouth daily. 01/13/23 01/08/24  Revankar, Aundra Dubin, MD  sodium bicarbonate 650 MG tablet Take 650 mg by mouth 2 (two) times daily. 12/30/20   [provider]    Family History Family History  Problem Relation Age of Onset   Breast cancer Mother 60   Arthritis Mother    Hyperlipidemia Mother    Heart disease Mother    Hypertension Mother    Kidney disease Mother    Diabetes Mother    Arthritis Father    Esophageal cancer Father 28   Congestive Heart Failure Sister    Breast cancer Maternal Aunt        68s   Breast cancer Maternal Aunt        20s   Esophageal cancer Paternal Uncle 60   Colon cancer Maternal Grandmother 78   Colon polyps Maternal Grandmother 78   Breast cancer Maternal Grandmother 72   Arthritis Maternal Grandmother    Hypertension Maternal Grandmother    Kidney disease Maternal Grandmother    Diabetes Maternal Grandmother    Breast cancer Cousin 28   Breast cancer Cousin        63s   Arthritis Other    Kidney disease Other        [place] nephritis -pt states mostly maternal side of family as the disease   Rectal cancer Neg Hx    Stomach cancer Neg Hx    Pancreatic cancer Neg Hx     Social History Social History   Tobacco Use   Smoking status: Never   Smokeless tobacco: Never   Vaping Use   Vaping Use: Never used  Substance Use Topics   Alcohol use: No    Alcohol/week: 0.0 standard drinks of alcohol   Drug use: No     Allergies   Augmentin [amoxicillin-pot clavulanate], Sulfamethoxazole-trimethoprim, and Sulfonamide derivatives   Review of Systems Review of Systems  Skin:  Positive for rash.  All other systems reviewed  and are negative.    Physical Exam Triage Vital Signs ED Triage Vitals [03/20/23 1033]  Enc Vitals Group     BP 111/71     Pulse Rate 75     Resp 16     Temp 98.2 F (36.8 C)     Temp src      SpO2 98 %     Weight      Height      Head Circumference      Peak Flow      Pain Score      Pain Loc      Pain Edu?      Excl. in GC?    No data found.  Updated Vital Signs BP 111/71   Pulse 75   Temp 98.2 F (36.8 C)   Resp 16   SpO2 98%      Physical Exam Vitals and nursing note reviewed.  Constitutional:      Appearance: Normal appearance. She is normal weight.  HENT:     Head: Normocephalic and atraumatic.     Mouth/Throat:     Mouth: Mucous membranes are moist.     Pharynx: Oropharynx is clear.  Eyes:     Extraocular Movements: Extraocular movements intact.     Conjunctiva/sclera: Conjunctivae normal.     Pupils: Pupils are equal, round, and reactive to light.  Cardiovascular:     Rate and Rhythm: Normal rate and regular rhythm.     Pulses: Normal pulses.     Heart sounds: Normal heart sounds.  Pulmonary:     Effort: Pulmonary effort is normal.     Breath sounds: Normal breath sounds. No wheezing, rhonchi or rales.  Musculoskeletal:        General: Normal range of motion.     Cervical back: Normal range of motion and neck supple.  Skin:    General: Skin is warm and dry.     Comments: Left posterior leg (inferior medial aspect): Tiny (<0.5) erythematous pinpoint erythematous papule-please see image below  Neurological:     General: No focal deficit present.     Mental Status: She is alert and  oriented to person, place, and time. Mental status is at baseline.      UC Treatments / Results  Labs (all labs ordered are listed, but only abnormal results are displayed) Labs Reviewed - No data to display  EKG   Radiology No results found.  Procedures Procedures (including critical care time)  Medications Ordered in UC Medications - No data to display  Initial Impression / Assessment and Plan / UC Course  I have reviewed the triage vital signs and the nursing notes.  Pertinent labs & imaging results that were available during my care of the patient were reviewed by me and considered in my medical decision making (see chart for details).     MDM: 1.  Cellulitis of lower leg, left-Rx'd Doxycycline 100 mg twice daily x 10 days; 2.  Tick bite of left leg, initial encounter-Rx'd Doxycycline 100 mg twice daily x 10 days. Advised patient to take medication as directed with food to completion.  Encouraged patient to increase daily water intake to 64 ounces per day while taking this medication.  Advised if symptoms worsen and/or unresolved please follow-up with PCP or here for further evaluation.  Patient discharged home, hemodynamically stable. Final Clinical Impressions(s) / UC Diagnoses   Final diagnoses:  Cellulitis of leg, left  Tick bite of left lower leg, initial encounter  Discharge Instructions      Advised patient to take medication as directed with food to completion.  Encouraged patient to increase daily water intake to 64 ounces per day while taking this medication.  Advised if symptoms worsen and/or unresolved please follow-up with PCP or here for further evaluation.     ED Prescriptions     Medication Sig Dispense Auth. Provider   doxycycline (VIBRAMYCIN) 100 MG capsule Take 1 capsule (100 mg total) by mouth 2 (two) times daily for 10 days. 20 capsule Trevor Iha, FNP      PDMP not reviewed this encounter.   Trevor Iha, FNP 03/20/23 1118

## 2023-03-20 NOTE — Discharge Instructions (Addendum)
Advised patient to take medication as directed with food to completion.  Encouraged patient to increase daily water intake to 64 ounces per day while taking this medication.  Advised if symptoms worsen and/or unresolved please follow-up with PCP or here for further evaluation. 

## 2023-03-20 NOTE — ED Triage Notes (Addendum)
Pt presents to uc with co of tick bite she found this morning was able to remove the tick at home. Site is on her left calf. Pt reports she thinks it may have been on for a few days. Redness noted on the site.

## 2023-03-23 DIAGNOSIS — E782 Mixed hyperlipidemia: Secondary | ICD-10-CM | POA: Diagnosis not present

## 2023-03-24 ENCOUNTER — Ambulatory Visit: Payer: Medicare Other | Admitting: Physical Therapy

## 2023-03-24 ENCOUNTER — Encounter: Payer: Self-pay | Admitting: Physical Therapy

## 2023-03-24 DIAGNOSIS — M62838 Other muscle spasm: Secondary | ICD-10-CM

## 2023-03-24 LAB — HEPATIC FUNCTION PANEL
ALT: 13 IU/L (ref 0–32)
AST: 23 IU/L (ref 0–40)
Albumin: 4.2 g/dL (ref 3.9–4.9)
Alkaline Phosphatase: 121 IU/L (ref 44–121)
Bilirubin Total: <0.2 mg/dL (ref 0.0–1.2)
Bilirubin, Direct: <0.1 mg/dL (ref 0.00–0.40)
Total Protein: 6.3 g/dL (ref 6.0–8.5)

## 2023-03-24 LAB — LIPID PANEL
Chol/HDL Ratio: 2.9 ratio (ref 0.0–4.4)
Cholesterol, Total: 156 mg/dL (ref 100–199)
HDL: 54 mg/dL (ref >39)
LDL Chol Calc (NIH): 76 mg/dL (ref 0–99)
Triglycerides: 149 mg/dL (ref 0–149)
VLDL Cholesterol Cal: 26 mg/dL (ref 5–40)

## 2023-03-24 NOTE — Therapy (Signed)
OUTPATIENT PHYSICAL THERAPY CERVICAL EVALUATION   Patient Name: Sarah Valentine MRN: [id number] DOB:[birth year], 72 y.o., female Today's Date: 03/24/2023  END OF SESSION:  PT End of Session - 03/24/23 1018     Visit Number 2    Number of Visits 12    Date for PT Re-Evaluation 04/27/23    Authorization Type Medicare    Progress Note Due on Visit 10    PT Start Time 1019    PT Stop Time 1100    PT Time Calculation (min) 41 min    Activity Tolerance Patient tolerated treatment well    Behavior During Therapy [facility] for tasks assessed/performed             Past Medical History:  Diagnosis Date   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   Barrett's esophagus    BCC (basal cell carcinoma), trunk 08/2013   removed from back (gould)   COVID-19 04/09/2021   Elbow stiffness, right 03/26/2022   ETD (Eustachian tube dysfunction), left 05/29/2022   Family history of breast cancer 11/25/2015   Multiple relatives  3D mammo annually  MRI of breast every other year - has been covered  Does not follow with Gyn  Will get MRI next year - 2018   Frequent UTI 08/29/2018   History of melanoma    Melanoma in situ (L) buttock, 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 No further follow-up is necessary.   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   excised from back (gould)   URI (upper respiratory infection) 12/17/2022   Past Surgical History:  Procedure Laterality Date   ABDOMINAL HYSTERECTOMY  11/03/1991   BACK SURGERY  2020   and in 2021 due to herniated disc   BASAL CELL CARCINOMA EXCISION  08/02/2013   back   CHOLECYSTECTOMY  11/02/2008   COLONOSCOPY  2019   DJ-MAC-suprep(good)-tics/polyps x 5 (TA/SSP)   COLONOSCOPY W/ BIOPSIES AND POLYPECTOMY  12/03/2008   polyp removed   MELANOMA EXCISION Left 11/02/2009   buttock   Perinephtric abscess with peritonitis  11/02/1978   POLYPECTOMY  2019   TA and SSP   SQUAMOUS CELL CARCINOMA EXCISION  01/31/2014   back   Patient Active Problem List   Diagnosis Date Noted   Trapezius muscle strain, left, initial encounter 02/10/2023   Renal insufficiency 12/29/2022   Chest pain of uncertain etiology 12/29/2022   Cardiac murmur 12/29/2022   URI (upper respiratory infection) 12/17/2022   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   ETD (Eustachian tube dysfunction), left 05/29/2022   Right elbow pain 03/26/2022   Elbow stiffness, right 03/26/2022   RUQ discomfort 10/06/2021   Rib pain on right side 10/06/2021  COVID-19 04/09/2021   Lumbar radiculopathy 05/09/2019   Frequent UTI 08/29/2018   Acute cystitis without hematuria 08/27/2017   Prediabetes 10/07/2016   Lung nodule, solitary - benign, no f/u 10/07/2016   Family history of breast cancer 11/25/2015   Nephritis, hereditary 11/14/2014   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   Incomplete RBBB    BCC (basal cell carcinoma), trunk 08/2013   Hypertension    Hyperlipidemia    History of melanoma    Migraines    BARRETTS ESOPHAGUS 09/05/2010   PERSONAL HISTORY OF COLONIC POLYPS 09/05/2010   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HCC) 2011   Hx of adenomatous colonic polyps 12/2008    PCP: Cheryll Cockayne  REFERRING PROVIDER: Clementeen Graham  REFERRING DIAG: cervical paraspinal muscle spasm  THERAPY  DIAG:  Cervical paraspinal muscle spasm  Rationale for Evaluation and Treatment: Rehabilitation  ONSET DATE: 01/2023  SUBJECTIVE:                                                                                                                                                                                                         SUBJECTIVE STATEMENT: Pt reports TPDN didn't make a difference. Pt states she hasn't been able to be consistent with her exercises.   PERTINENT HISTORY:  None reported  PAIN:  Are you having pain? Yes: NPRS scale: 4/10 Pain location: Lt neck/upper trap Pain description: core, achey Aggravating factors: sleep Relieving factors: massage, heat  PRECAUTIONS: None  WEIGHT BEARING RESTRICTIONS: No  FALLS:  Has patient fallen in last 6 months? No  OCCUPATION: retired  PLOF: Independent  PATIENT GOALS: decrease pain  NEXT MD VISIT: PRN  OBJECTIVE:   DIAGNOSTIC FINDINGS:  Cervical xray: Degenerative changes. No acute osseous abnormalities.   PATIENT SURVEYS:  FOTO 69  POSTURE: rounded shoulders and forward head  PALPATION: Increased mm spasticity bilat cervical paraspinals, Lt upper trap and levator Hypomobile C5-7 with lateral glides   CERVICAL ROM:   Active ROM A/PROM (deg) eval  Flexion 44  Extension 34  Right lateral flexion 25  Left lateral flexion 35  Right rotation 60  Left rotation 25   (Blank rows = not tested)   UPPER EXTREMITY MMT:  MMT Right eval Left eval  Shoulder flexion 4+ 4+  Shoulder extension    Shoulder abduction 4+ 4+  Shoulder adduction    Shoulder extension    Shoulder internal rotation    Shoulder external rotation    Middle trapezius    Lower trapezius    Elbow flexion    Elbow extension  Wrist flexion    Wrist extension    Wrist ulnar deviation    Wrist radial deviation    Wrist pronation    Wrist supination    Grip strength     (Blank rows = not tested)   TODAY'S TREATMENT:              [facility] Adult PT Treatment:                                                DATE: 03/24/23 Therapeutic Exercise: UBE L2, 2 min fwd, 2 min bwd Supine cervical rotation iso MWM 5x5 sec S/L open book 10x5 sec  Sitting first rib mobilization with belt x10 Sitting anterior scalene stretch 2x30 sec Seated cervical rotation with towel 5x5 sec Seated thoracic extension against chair x5 Seated thoracic extension + rotation x5 Prone "I", "W" x10 each Manual Therapy: STM& TPR L UT and anterior scalene First rib mobilization grade II CPAs/UPAs thoracic paraspinals grade II to III Side glide cervical paraspinals grade II to III -- could feel pull into thoracic spine with rotation Skilled assessment and palpation for TPDN Trigger Point Dry-Needling  Treatment instructions: Expect mild to moderate muscle soreness. S/S of pneumothorax if dry needled over a lung field, and to seek immediate medical attention should they occur. Patient verbalized understanding of these instructions and education.  Patient Consent Given: Yes Education handout provided: Previously provided Muscles treated: L UT Electrical stimulation performed: No Parameters: N/A Treatment response/outcome: Twitch response, improve muscle extensibility                                                                                                                    [facility] Adult PT Treatment:                                                DATE: 03/16/23 Therapeutic Exercise: See HEP Manual Therapy: STM bilat paraspinals and upper traps Trigger Point Dry-Needling  Treatment instructions: Expect mild to moderate muscle soreness. S/S of pneumothorax if dry needled over a lung field, and to seek immediate medical attention should they occur. Patient verbalized understanding of these instructions and education.  Patient Consent Given: Yes Education handout provided: Yes Muscles treated: bilat paraspinals, Lt upper trap and levator Electrical  stimulation performed: No Parameters: N/A Treatment response/outcome: improved muscle length    PATIENT EDUCATION:  Education details: Discussed HEP modification and keeping UT from re-tightening (I.e. TENS, heat, massage, stretches) Person educated: Patient Education method: Explanation, Demonstration, and Handouts Education comprehension: verbalized understanding and returned demonstration  HOME EXERCISE PROGRAM: Access Code: [email or web address] URL: [email or web address] Date: 03/16/2023 Prepared by: Reggy Eye  Exercises - Seated Assisted Cervical Rotation with Towel  - 1 x daily - 7  x weekly - 1 sets - 10 reps - 10 seconds hold - Seated Cervical Retraction  - 1 x daily - 7 x weekly - 2 sets - 10 reps - Seated Upper Trapezius Stretch  - 1 x daily - 7 x weekly - 1 sets - 10 reps - 10 seconds hold  ASSESSMENT:  CLINICAL IMPRESSION: Treatment focused on improving cervical ROM and decreasing muscle spasm. Increased rotation noted after treatment. By end of session, pt's cervical pain had decreased and she could feel it in her original source of pain in her posterior shoulder/midback. Initiated thoracic mobilization exercises and scapular strengthening.   OBJECTIVE IMPAIRMENTS: decreased activity tolerance, decreased ROM, hypomobility, increased muscle spasms, and pain.     GOALS: Goals reviewed with patient? Yes  SHORT TERM GOALS: Target date: 03/30/2023    Pt will be independent in initial HEP Baseline:  Goal status: INITIAL   LONG TERM GOALS: Target date: 04/27/2023    Pt will be independent with advanced HEP Baseline:  Goal status: INITIAL  2.  Pt will improve FOTO to >= 70 to demo improved functional mobility Baseline:  Goal status: INITIAL  3.  Pt will report neck pain with sleeping <= 1/10 Baseline:  Goal status: INITIAL  4.  Pt will improve LT rotation to >= 50 degrees to ease ability to drive Baseline:  Goal status:  INITIAL    PLAN:  PT FREQUENCY: 1-2x/week  PT DURATION: 6 weeks  PLANNED INTERVENTIONS: Therapeutic exercises, Therapeutic activity, Neuromuscular re-education, Balance training, Gait training, Patient/Family education, Self Care, Joint mobilization, Aquatic Therapy, Dry Needling, Electrical stimulation, Cryotherapy, Moist heat, Taping, Traction, Ultrasound, Ionotophoresis 4mg /ml Dexamethasone, Manual therapy, and Re-evaluation  PLAN FOR NEXT SESSION: assess response to dry needling and HEP. Thoracic mobilization, scapular strengthening. Continue cervical ROM.    Rosalita Carey April Ma L Kayden Hutmacher, PT 03/24/2023, 10:19 AM

## 2023-04-01 ENCOUNTER — Encounter: Payer: Self-pay | Admitting: Physical Therapy

## 2023-04-01 ENCOUNTER — Ambulatory Visit: Payer: Medicare Other | Admitting: Physical Therapy

## 2023-04-01 DIAGNOSIS — M62838 Other muscle spasm: Secondary | ICD-10-CM

## 2023-04-01 NOTE — Therapy (Addendum)
OUTPATIENT PHYSICAL THERAPY CERVICAL TREATMENT AND DISCHARGE   Patient Name: Sarah Valentine MRN: [id number] DOB:06/08/1951, 72 y.o., female Today's Date: 04/01/2023  END OF SESSION:  PT End of Session - 04/01/23 1407     Visit Number 3    Number of Visits 12    Date for PT Re-Evaluation 04/27/23    Authorization Type Medicare    Progress Note Due on Visit 10    PT Start Time 1406    PT Stop Time 1445    PT Time Calculation (min) 39 min    Activity Tolerance Patient tolerated treatment well    Behavior During Therapy [facility] for tasks assessed/performed             Past Medical History:  Diagnosis Date   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   Barrett's esophagus    BCC (basal cell carcinoma), trunk 08/2013   removed from back (gould)   COVID-19 04/09/2021   Elbow stiffness, right 03/26/2022   ETD (Eustachian tube dysfunction), left 05/29/2022   Family history of breast cancer 11/25/2015   Multiple relatives  3D mammo annually  MRI of breast every other year - has been covered  Does not follow with Gyn  Will get MRI next year - 2018   Frequent UTI 08/29/2018   History of melanoma    Melanoma in situ (L) buttock, 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 No further follow-up is necessary.   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   PERSONAL HISTORY OF COLONIC POLYPS 09/05/2010   Qualifier: Diagnosis of   By: Melvyn Neth CMA (AAMA), Patty       Prediabetes 10/07/2016   Rib pain on right side 10/06/2021   Right elbow pain 03/26/2022   RUQ discomfort 10/06/2021   Squamous cell  carcinoma in situ of skin 01/2014   excised from back (gould)   URI (upper respiratory infection) 12/17/2022   Past Surgical History:  Procedure Laterality Date   ABDOMINAL HYSTERECTOMY  11/03/1991   BACK SURGERY  2020   and in 2021 due to herniated disc   BASAL CELL CARCINOMA EXCISION  08/02/2013   back   CHOLECYSTECTOMY  11/02/2008   COLONOSCOPY  2019   DJ-MAC-suprep(good)-tics/polyps x 5 (TA/SSP)   COLONOSCOPY W/ BIOPSIES AND POLYPECTOMY  12/03/2008   polyp removed   MELANOMA EXCISION Left 11/02/2009   buttock   Perinephtric abscess with peritonitis  11/02/1978   POLYPECTOMY  2019   TA and SSP   SQUAMOUS CELL CARCINOMA EXCISION  01/31/2014   back   Patient Active Problem List   Diagnosis Date Noted   Trapezius muscle strain, left, initial encounter 02/10/2023   Renal insufficiency 12/29/2022   Chest pain of uncertain etiology 12/29/2022   Cardiac murmur 12/29/2022   URI (upper respiratory infection) 12/17/2022   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   ETD (Eustachian tube dysfunction), left 05/29/2022   Right elbow pain 03/26/2022   Elbow stiffness, right 03/26/2022   RUQ discomfort 10/06/2021   Rib pain on right side 10/06/2021  COVID-19 04/09/2021   Lumbar radiculopathy 05/09/2019   Frequent UTI 08/29/2018   Acute cystitis without hematuria 08/27/2017   Prediabetes 10/07/2016   Lung nodule, solitary - benign, no f/u 10/07/2016   Family history of breast cancer 11/25/2015   Nephritis, hereditary 11/14/2014   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   Incomplete RBBB    BCC (basal cell carcinoma), trunk 08/2013   Hypertension    Hyperlipidemia    History of melanoma    Migraines    BARRETTS ESOPHAGUS 09/05/2010   PERSONAL HISTORY OF COLONIC POLYPS 09/05/2010   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HCC) 2011   Hx of adenomatous colonic polyps 12/2008    PCP: Cheryll Cockayne  REFERRING PROVIDER: Clementeen Graham  REFERRING DIAG: cervical paraspinal  muscle spasm  THERAPY DIAG:  Cervical paraspinal muscle spasm  Rationale for Evaluation and Treatment: Rehabilitation  ONSET DATE: 01/2023  SUBJECTIVE:                                                                                                                                                                                                         SUBJECTIVE STATEMENT: Pt states she feels she was able to maintain some of her neck ROM. Notes that the midback pain has improved some. The neck pain is now more along her spine. Requests to leave PT early due to another appointment. States she will be out of state for the next 2-3 weeks for her husband's cancer treatment.   PERTINENT HISTORY:  None reported  PAIN:  Are you having pain? Yes: NPRS scale: 4/10 Pain location: Lt neck/upper trap Pain description: core, achey Aggravating factors: sleep Relieving factors: massage, heat  PRECAUTIONS: None  WEIGHT BEARING RESTRICTIONS: No  FALLS:  Has patient fallen in last 6 months? No  OCCUPATION: retired  PLOF: Independent  PATIENT GOALS: decrease pain  NEXT MD VISIT: PRN  OBJECTIVE:   DIAGNOSTIC FINDINGS:  Cervical xray: Degenerative changes. No acute osseous abnormalities.   PATIENT SURVEYS:  FOTO 69  POSTURE: rounded shoulders and forward head  PALPATION: Increased mm spasticity bilat cervical paraspinals, Lt upper trap and levator Hypomobile C5-7 with lateral glides   CERVICAL ROM:   Active ROM A/PROM (deg) eval 04/01/23  Flexion 44   Extension 34   Right lateral flexion 25   Left lateral flexion 35   Right rotation 60 70  Left rotation 25 60   (Blank rows = not tested)   UPPER EXTREMITY MMT:  MMT Right eval Left eval  Shoulder flexion 4+ 4+  Shoulder extension  Shoulder abduction 4+ 4+  Shoulder adduction    Shoulder extension    Shoulder internal rotation    Shoulder external rotation    Middle trapezius    Lower trapezius    Elbow flexion     Elbow extension    Wrist flexion    Wrist extension    Wrist ulnar deviation    Wrist radial deviation    Wrist pronation    Wrist supination    Grip strength     (Blank rows = not tested)   TODAY'S TREATMENT:             [facility] Adult PT Treatment:                                                DATE: 04/01/23 Therapeutic Exercise: Seated cervical rotation with towel 5x5 sec R&L Seated cervical rotation x10 R&L (could feel pull into midback at end range) Supine cervical rotation iso MWM 5x5 sec R&L Thoracic rotation with breaths 5x5 sec R&L Manual Therapy: CPAs/UPAs thoracic paraspinals grade II to III Thoracic rotation/ext in sitting Side glide cervical paraspinals grade II to III Skilled assessment and palpation for TPDN Trigger Point Dry-Needling  Treatment instructions: Expect mild to moderate muscle soreness. S/S of pneumothorax if dry needled over a lung field, and to seek immediate medical attention should they occur. Patient verbalized understanding of these instructions and education.  Patient Consent Given: Yes Education handout provided: Previously provided Muscles treated: L thoracic paraspinals Electrical stimulation performed: No Parameters: N/A Treatment response/outcome: Twitch response, improve muscle extensibility   [facility] Adult PT Treatment:                                                DATE: 03/24/23 Therapeutic Exercise: UBE L2, 2 min fwd, 2 min bwd Supine cervical rotation iso MWM 5x5 sec S/L open book 10x5 sec  Sitting first rib mobilization with belt x10 Sitting anterior scalene stretch 2x30 sec Seated cervical rotation with towel 5x5 sec Seated thoracic extension against chair x5 Seated thoracic extension + rotation x5 Prone "I", "W" x10 each Manual Therapy: STM& TPR L UT and anterior scalene First rib mobilization grade II CPAs/UPAs thoracic paraspinals grade II to III Side glide cervical paraspinals grade II to III -- could feel pull into  thoracic spine with rotation Skilled assessment and palpation for TPDN Trigger Point Dry-Needling  Treatment instructions: Expect mild to moderate muscle soreness. S/S of pneumothorax if dry needled over a lung field, and to seek immediate medical attention should they occur. Patient verbalized understanding of these instructions and education.  Patient Consent Given: Yes Education handout provided: Previously provided Muscles treated: L UT Electrical stimulation performed: No Parameters: N/A Treatment response/outcome: Twitch response, improve muscle extensibility  [facility] Adult PT Treatment:                                                DATE: 03/16/23 Therapeutic Exercise: See HEP Manual Therapy: STM bilat paraspinals and upper traps Trigger Point Dry-Needling  Treatment instructions: Expect mild to moderate muscle soreness. S/S of pneumothorax if dry needled over a lung field, and to seek immediate medical attention should they occur. Patient verbalized understanding of these instructions and education.  Patient Consent Given: Yes Education handout provided: Yes Muscles treated: bilat paraspinals, Lt upper trap and levator Electrical stimulation performed: No Parameters: N/A Treatment response/outcome: improved muscle length    PATIENT EDUCATION:  Education details: Discussed HEP modification and keeping UT from re-tightening (I.e. TENS, heat, massage, stretches) Person educated: Patient Education method: Explanation, Demonstration, and Handouts Education comprehension: verbalized understanding and returned demonstration  HOME EXERCISE PROGRAM: Access Code: [email or web address] URL: [email or web address] Date: 03/16/2023 Prepared by: Reggy Eye  Exercises - Seated Assisted Cervical Rotation with Towel  - 1 x daily - 7 x weekly - 1 sets - 10 reps - 10  seconds hold - Seated Cervical Retraction  - 1 x daily - 7 x weekly - 2 sets - 10 reps - Seated Upper Trapezius Stretch  - 1 x daily - 7 x weekly - 1 sets - 10 reps - 10 seconds hold  ASSESSMENT:  CLINICAL IMPRESSION: Continued to work on improving ROM and pain. Responded well to TPDN -- could feel less pull into midback with cervical rotation after treating thoracic paraspinals. Pt is demonstrating increase in cervical ROM.   OBJECTIVE IMPAIRMENTS: decreased activity tolerance, decreased ROM, hypomobility, increased muscle spasms, and pain.     GOALS: Goals reviewed with patient? Yes  SHORT TERM GOALS: Target date: 03/30/2023    Pt will be independent in initial HEP Baseline:  Goal status: MET   LONG TERM GOALS: Target date: 04/27/2023    Pt will be independent with advanced HEP Baseline:  Goal status: INITIAL  2.  Pt will improve FOTO to >= 70 to demo improved functional mobility Baseline:  Goal status: INITIAL  3.  Pt will report neck pain with sleeping <= 1/10 Baseline:  Goal status: INITIAL  4.  Pt will improve LT rotation to >= 50 degrees to ease ability to drive Baseline:  Goal status: MET    PLAN:  PT FREQUENCY: 1-2x/week  PT DURATION: 6 weeks  PLANNED INTERVENTIONS: Therapeutic exercises, Therapeutic activity, Neuromuscular re-education, Balance training, Gait training, Patient/Family education, Self Care, Joint mobilization, Aquatic Therapy, Dry Needling, Electrical stimulation, Cryotherapy, Moist heat, Taping, Traction, Ultrasound, Ionotophoresis 4mg /ml Dexamethasone, Manual therapy, and Re-evaluation  PLAN FOR NEXT SESSION: assess response to dry needling and HEP. Thoracic mobilization, scapular strengthening. Continue cervical ROM.   PHYSICAL THERAPY DISCHARGE SUMMARY  Visits from Start of Care: 3  Current functional level related to goals / functional outcomes: Improving ROM, decreased pain   Remaining deficits: See above   Education /  Equipment: HEP   Patient agrees to discharge. Patient goals were partially met. Patient is being discharged due to  husband going through cancer treatment out of state.  Reggy Eye, PT,DPT07/05/249:54 AM   Vernon Prey April Ma L [place], PT 04/01/2023, 2:08 PM

## 2023-04-23 DIAGNOSIS — N3020 Other chronic cystitis without hematuria: Secondary | ICD-10-CM | POA: Diagnosis not present

## 2023-04-23 DIAGNOSIS — R8271 Bacteriuria: Secondary | ICD-10-CM | POA: Diagnosis not present

## 2023-04-29 ENCOUNTER — Ambulatory Visit: Payer: Medicare Other | Admitting: Physical Therapy

## 2023-05-21 DIAGNOSIS — N3020 Other chronic cystitis without hematuria: Secondary | ICD-10-CM | POA: Diagnosis not present

## 2023-06-01 ENCOUNTER — Other Ambulatory Visit: Payer: Self-pay | Admitting: Internal Medicine

## 2023-06-02 ENCOUNTER — Encounter (INDEPENDENT_AMBULATORY_CARE_PROVIDER_SITE_OTHER): Payer: Self-pay

## 2023-06-14 NOTE — Progress Notes (Unsigned)
Subjective:    Patient ID: Sarah Valentine, female    DOB: [birth year], 72 y.o.   MRN: [id number]     HPI Margine is here for follow up of her chronic medical problems.  Walking 2-4 miles a day  Overall doing well-husband's cancer has recurred and is metastatic.  Increased stress related to coordinating care at [facility]  Medications and allergies reviewed with patient and updated if appropriate.  Current Outpatient Medications on File Prior to Visit  Medication Sig Dispense Refill   Cholecalciferol (VITAMIN D3) 50 MCG (2000 UT) capsule Take 2,000 Units by mouth daily.     Cranberry 1000 MG CAPS Take 500 mg by mouth daily.      estradiol (ESTRACE) 0.1 MG/GM vaginal cream Place 1 Applicatorful vaginally 2 (two) times a week. 42.5 g 11   FEROSUL 325 (65 Fe) MG tablet Take 325 mg by mouth daily.     rosuvastatin (CRESTOR) 10 MG tablet Take 1 tablet (10 mg total) by mouth daily. 90 tablet 3   sodium bicarbonate 650 MG tablet Take 650 mg by mouth 2 (two) times daily.     No current facility-administered medications on file prior to visit.     Review of Systems  Constitutional:  Negative for fever.  Respiratory:  Negative for cough, shortness of breath and wheezing.   Cardiovascular:  Negative for chest pain, palpitations and leg swelling.  Neurological:  Negative for light-headedness and headaches.       Objective:   Vitals:   06/15/23 1016  BP: 110/70  Pulse: 72  Temp: 98.2 F (36.8 C)  SpO2: 97%   BP Readings from Last 3 Encounters:  06/15/23 110/70  03/20/23 111/71  03/05/23 118/80   Wt Readings from Last 3 Encounters:  06/15/23 137 lb (62.1 kg)  03/05/23 138 lb 12.8 oz (63 kg)  02/10/23 139 lb (63 kg)   Body mass index is 23.81 kg/m.    Physical Exam Constitutional:      General: She is not in acute distress.    Appearance: Normal appearance.  HENT:     Head: Normocephalic and atraumatic.  Eyes:     Conjunctiva/sclera: Conjunctivae  normal.  Cardiovascular:     Rate and Rhythm: Normal rate and regular rhythm.     Heart sounds: Normal heart sounds.  Pulmonary:     Effort: Pulmonary effort is normal. No respiratory distress.     Breath sounds: Normal breath sounds. No wheezing.  Musculoskeletal:     Cervical back: Neck supple.     Right lower leg: No edema.     Left lower leg: No edema.  Lymphadenopathy:     Cervical: No cervical adenopathy.  Skin:    General: Skin is warm and dry.     Findings: No rash.  Neurological:     Mental Status: She is alert. Mental status is at baseline.  Psychiatric:        Mood and Affect: Mood normal.        Behavior: Behavior normal.        Lab Results  Component Value Date   WBC 10.9 (H) 11/22/2022   HGB 12.6 11/22/2022   HCT 39.4 11/22/2022   PLT 354 11/22/2022   GLUCOSE 100 (H) 01/12/2023   CHOL 156 03/23/2023   TRIG 149 03/23/2023   HDL 54 03/23/2023   LDLDIRECT 171.3 10/20/2013   LDLCALC 76 03/23/2023   ALT 13 03/23/2023   AST 23 03/23/2023  NA 145 (H) 01/12/2023   K 4.8 01/12/2023   CL 109 (H) 01/12/2023   CREATININE 1.98 (H) 01/12/2023   BUN 38 (H) 01/12/2023   CO2 22 01/12/2023   TSH 1.39 06/18/2021   HGBA1C 6.0 06/18/2021   MICROALBUR 61.0 (H) 03/31/2019     Assessment & Plan:    See Problem List for Assessment and Plan of chronic medical problems.

## 2023-06-14 NOTE — Patient Instructions (Addendum)
      Blood work was ordered.   The lab is on the first floor.    Medications changes include :       A referral was ordered and someone will call you to schedule an appointment.     Return in about 6 months (around 12/16/2023) for follow up.

## 2023-06-15 ENCOUNTER — Encounter: Payer: Self-pay | Admitting: Internal Medicine

## 2023-06-15 ENCOUNTER — Ambulatory Visit (INDEPENDENT_AMBULATORY_CARE_PROVIDER_SITE_OTHER): Payer: Medicare Other | Admitting: Internal Medicine

## 2023-06-15 VITALS — BP 110/70 | HR 72 | Temp 98.2°F | Ht 63.6 in | Wt 137.0 lb

## 2023-06-15 DIAGNOSIS — R7303 Prediabetes: Secondary | ICD-10-CM | POA: Diagnosis not present

## 2023-06-15 DIAGNOSIS — N390 Urinary tract infection, site not specified: Secondary | ICD-10-CM | POA: Diagnosis not present

## 2023-06-15 DIAGNOSIS — I10 Essential (primary) hypertension: Secondary | ICD-10-CM

## 2023-06-15 DIAGNOSIS — N289 Disorder of kidney and ureter, unspecified: Secondary | ICD-10-CM

## 2023-06-15 DIAGNOSIS — N078 Hereditary nephropathy, not elsewhere classified with other morphologic lesions: Secondary | ICD-10-CM | POA: Diagnosis not present

## 2023-06-15 MED ORDER — ONDANSETRON HCL 4 MG PO TABS: 4.0000 mg | ORAL_TABLET | Freq: Three times a day (TID) | ORAL | 0 refills | Status: DC | PRN

## 2023-06-15 MED ORDER — OLMESARTAN MEDOXOMIL 20 MG PO TABS: 20.0000 mg | ORAL_TABLET | Freq: Every day | ORAL | 3 refills | Status: DC

## 2023-06-15 NOTE — Assessment & Plan Note (Addendum)
Chronic Blood pressure well-controlled Blood work will be done soon with nephrology Continue olmesartan 20 mg daily

## 2023-06-15 NOTE — Assessment & Plan Note (Addendum)
Chronic Following with nephrology To have blood work done soon with nephrology so we will hold off on checking blood work today

## 2023-06-15 NOTE — Assessment & Plan Note (Addendum)
Chronic Related to nephritis Following with nephrology

## 2023-06-15 NOTE — Assessment & Plan Note (Addendum)
Chronic Low sugar / carb diet Continue regular exercise

## 2023-06-15 NOTE — Assessment & Plan Note (Addendum)
Chronic Has seen urology in the past No recent UTI symptoms Continue Estrace vaginal cream twice weekly and cranberry pills-she will let me know when she needs a refill of this

## 2023-06-16 ENCOUNTER — Ambulatory Visit: Payer: Medicare Other | Admitting: Internal Medicine

## 2023-07-14 ENCOUNTER — Other Ambulatory Visit: Payer: Self-pay | Admitting: Internal Medicine

## 2023-07-14 DIAGNOSIS — Z1231 Encounter for screening mammogram for malignant neoplasm of breast: Secondary | ICD-10-CM

## 2023-07-16 DIAGNOSIS — N184 Chronic kidney disease, stage 4 (severe): Secondary | ICD-10-CM | POA: Diagnosis not present

## 2023-07-23 DIAGNOSIS — I129 Hypertensive chronic kidney disease with stage 1 through stage 4 chronic kidney disease, or unspecified chronic kidney disease: Secondary | ICD-10-CM | POA: Diagnosis not present

## 2023-07-23 DIAGNOSIS — D631 Anemia in chronic kidney disease: Secondary | ICD-10-CM | POA: Diagnosis not present

## 2023-07-23 DIAGNOSIS — N2581 Secondary hyperparathyroidism of renal origin: Secondary | ICD-10-CM | POA: Diagnosis not present

## 2023-07-23 DIAGNOSIS — E872 Acidosis, unspecified: Secondary | ICD-10-CM | POA: Diagnosis not present

## 2023-07-23 DIAGNOSIS — N184 Chronic kidney disease, stage 4 (severe): Secondary | ICD-10-CM | POA: Diagnosis not present

## 2023-08-12 DIAGNOSIS — Z1231 Encounter for screening mammogram for malignant neoplasm of breast: Secondary | ICD-10-CM

## 2023-08-14 ENCOUNTER — Ambulatory Visit
Admission: RE | Admit: 2023-08-14 | Discharge: 2023-08-14 | Disposition: A | Discharge status: Home / Self Care | Payer: Medicare Other | Source: Ambulatory Visit | Attending: Internal Medicine

## 2023-08-14 DIAGNOSIS — Z1231 Encounter for screening mammogram for malignant neoplasm of breast: Secondary | ICD-10-CM

## 2023-08-18 ENCOUNTER — Other Ambulatory Visit: Payer: Self-pay | Admitting: Internal Medicine

## 2023-08-18 DIAGNOSIS — R928 Other abnormal and inconclusive findings on diagnostic imaging of breast: Secondary | ICD-10-CM

## 2023-08-23 ENCOUNTER — Ambulatory Visit (INDEPENDENT_AMBULATORY_CARE_PROVIDER_SITE_OTHER): Payer: Medicare Other

## 2023-08-23 VITALS — Ht 63.6 in | Wt 137.0 lb

## 2023-08-23 DIAGNOSIS — Z0000 Encounter for general adult medical examination without abnormal findings: Secondary | ICD-10-CM | POA: Diagnosis not present

## 2023-08-23 NOTE — Patient Instructions (Signed)
Ms. Kriese , Thank you for taking time to come for your Medicare Wellness Visit. I appreciate your ongoing commitment to your health goals. Please review the following plan we discussed and let me know if I can assist you in the future.   Referrals/Orders/Follow-Ups/Clinician Recommendations: It was nice to speak with you today.  Keep up the good work.  This is a list of the screening recommended for you and due dates:  Health Maintenance  Topic Date Due   COVID-19 Vaccine (3 - Pfizer risk series) 01/16/2020   DEXA scan (bone density measurement)  06/22/2024   Medicare Annual Wellness Visit  08/22/2024   Mammogram  08/13/2025   DTaP/Tdap/Td vaccine (3 - Td or Tdap) 12/04/2025   Colon Cancer Screening  10/03/2026   Pneumonia Vaccine  Completed   Flu Shot  Completed   Hepatitis C Screening  Completed   Zoster (Shingles) Vaccine  Completed   HPV Vaccine  Aged Out    Advanced directives: (Copy Requested) Please bring a copy of your health care power of attorney and living will to the office to be added to your chart at your convenience.  Next Medicare Annual Wellness Visit scheduled for next year: Yes

## 2023-08-23 NOTE — Progress Notes (Signed)
Subjective:   Sarah Valentine is a 72 y.o. female who presents for Medicare Annual (Subsequent) preventive examination.  Visit Complete: Virtual I connected with  Sarah Valentine on 08/23/23 by a audio enabled telemedicine application and verified that I am speaking with the correct person using two identifiers.  Patient Location: Home  Provider Location: Office/Clinic  I discussed the limitations of evaluation and management by telemedicine. The patient expressed understanding and agreed to proceed.  Vital Signs: Because this visit was a virtual/telehealth visit, some criteria may be missing or patient reported. Any vitals not documented were not able to be obtained and vitals that have been documented are patient reported.   Cardiac Risk Factors include: advanced age (>53men, >27 women);dyslipidemia;hypertension     Objective:    Today's Vitals   08/23/23 0855  Weight: 137 lb (62.1 kg)  Height: 5' 3.6" (1.615 m)   Body mass index is 23.81 kg/m.     08/23/2023    9:03 AM 11/22/2022    3:43 AM 07/13/2022    9:11 AM 06/27/2021   10:07 AM 05/10/2019    1:59 PM 07/30/2014    1:39 PM  Advanced Directives  Does Patient Have a Medical Advance Directive? Yes Yes Yes Yes Yes Yes  Type of Estate agent of [place];Living will Living will;Healthcare Power of Attorney Living will Living will;Healthcare Power of Attorney  Living will;Healthcare Power of Attorney  Does patient want to make changes to medical advance directive?   No - Patient declined No - Patient declined No - Patient declined   Copy of Healthcare Power of Attorney in Chart? No - copy requested   No - copy requested      Current Medications (verified) Outpatient Encounter Medications as of 08/23/2023  Medication Sig   Cholecalciferol (VITAMIN D3) 50 MCG (2000 UT) capsule Take 2,000 Units by mouth daily.   Cranberry 1000 MG CAPS Take 500 mg by mouth daily.    estradiol (ESTRACE) 0.1 MG/GM  vaginal cream Place 1 Applicatorful vaginally 2 (two) times a week.   olmesartan (BENICAR) 20 MG tablet Take 1 tablet (20 mg total) by mouth daily.   ondansetron (ZOFRAN) 4 MG tablet Take 1 tablet (4 mg total) by mouth every 8 (eight) hours as needed for nausea or vomiting.   rosuvastatin (CRESTOR) 10 MG tablet Take 1 tablet (10 mg total) by mouth daily.   sodium bicarbonate 650 MG tablet Take 650 mg by mouth 2 (two) times daily.   FEROSUL 325 (65 Fe) MG tablet Take 325 mg by mouth daily. (Patient not taking: Reported on 08/23/2023)   No facility-administered encounter medications on file as of 08/23/2023.    Allergies (verified) Augmentin [amoxicillin-pot clavulanate], Sulfamethoxazole-trimethoprim, and Sulfonamide derivatives   History: Past Medical History:  Diagnosis Date   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   Barrett's esophagus    BCC (basal cell carcinoma), trunk 08/2013   removed from back (gould)   COVID-19 04/09/2021   Elbow stiffness, right 03/26/2022   ETD (Eustachian tube dysfunction), left 05/29/2022   Family history of breast cancer 11/25/2015   Multiple relatives  3D mammo annually  MRI of breast every other year - has been covered  Does not follow with Gyn  Will get MRI next year - 2018   Frequent UTI 08/29/2018   History of melanoma    Melanoma in situ (L) buttock, 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 No further follow-up is necessary.   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   excised from back (gould)   URI (upper respiratory infection) 12/17/2022   Past Surgical History:  Procedure Laterality Date   ABDOMINAL HYSTERECTOMY  11/03/1991   BACK SURGERY  2020   and in 2021 due to herniated disc   BASAL CELL CARCINOMA EXCISION  08/02/2013   back   CHOLECYSTECTOMY  11/02/2008   COLONOSCOPY  2019   DJ-MAC-suprep(good)-tics/polyps x 5 (TA/SSP)   COLONOSCOPY W/ BIOPSIES AND POLYPECTOMY  12/03/2008   polyp removed   MELANOMA EXCISION Left 11/02/2009   buttock   Perinephtric abscess with peritonitis  11/02/1978   POLYPECTOMY  2019   TA and SSP   SQUAMOUS CELL CARCINOMA EXCISION  01/31/2014   back   Family History  Problem Relation Age of Onset   Breast cancer Mother 54   Arthritis Mother    Hyperlipidemia Mother    Heart disease Mother    Hypertension Mother    Kidney disease Mother    Diabetes Mother    Arthritis Father    Esophageal cancer Father 72   Congestive Heart Failure Sister    Breast cancer Maternal Aunt        29s   Breast cancer Maternal Aunt        70s   Esophageal cancer Paternal Uncle 75   Colon cancer Maternal Grandmother 78   Colon polyps Maternal Grandmother 78   Breast cancer Maternal Grandmother 72   Arthritis Maternal Grandmother    Hypertension Maternal Grandmother    Kidney disease Maternal Grandmother    Diabetes Maternal Grandmother    Breast cancer Cousin 13   Breast cancer Cousin        22s   Arthritis Other    Kidney disease Other        [place] nephritis -pt states mostly maternal side of family as the disease   Rectal cancer Neg Hx    Stomach cancer Neg Hx    Pancreatic cancer Neg Hx    Social History   Socioeconomic History   Marital status: Married    Spouse name: Sarah Valentine   Number of children: 3   Years of education:  Not on file   Highest education level: Not on file  Occupational History   Occupation: Academic librarian: [employer]   Occupation: Retired  Tobacco Use   Smoking status: Never   Smokeless tobacco: Never  Vaping Use   Vaping status: Never Used  Substance and Sexual Activity   Alcohol use: No    Alcohol/week: 0.0 standard drinks of alcohol   Drug use: No   Sexual activity: Not on file  Other Topics Concern   Not on file  Social  History Narrative   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   Married, lives with spouse - 3 daughters all in [place] area   [place] to [place] from [place] in [place]   Social Determinants of Health   Financial Resource Strain: Low Risk  (08/23/2023)   Overall Financial Resource Strain (CARDIA)    Difficulty of Paying Living Expenses: Not hard at all  Food Insecurity: No Food Insecurity (08/23/2023)   Hunger Vital Sign    Worried About Running Out of Food in the Last Year: Never true    Ran Out of Food in the Last Year: Never true  Transportation Needs: No Transportation Needs (08/23/2023)   PRAPARE - Administrator, Civil Service (Medical): No    Lack of Transportation (Non-Medical): No  Physical Activity: Sufficiently Active (08/23/2023)   Exercise Vital Sign    Days of Exercise per Week: 7 days    Minutes of Exercise per Session: 40 min  Stress: No Stress Concern Present (08/23/2023)   [employer] of Occupational Health - Occupational Stress Questionnaire    Feeling of Stress : Not at all  Social Connections: Moderately Integrated (08/23/2023)   Social Connection and Isolation Panel [NHANES]    Frequency of Communication with Friends and Family: More than three times a week    Frequency of Social Gatherings with Friends and Family: Three times a week    Attends Religious Services: 1 to 4 times per year    Active Member of Clubs or  Organizations: No    Attends Banker Meetings: Never    Marital Status: Married    Tobacco Counseling Counseling given: Not Answered   Clinical Intake:  Pre-visit preparation completed: Yes  Pain : No/denies pain     BMI - recorded: 23.81 Nutritional Status: BMI of 19-24  Normal Nutritional Risks: None Diabetes: No  How often do you need to have someone help you when you read instructions, pamphlets, or other written materials from your doctor or pharmacy?: 1 - Never  Interpreter Needed?: No  Information entered by :: Taeler Winning, RMA   Activities of Daily Living    08/23/2023    8:59 AM  In your present state of health, do you have any difficulty performing the following activities:  Hearing? 0  Vision? 0  Difficulty concentrating or making decisions? 0  Walking or climbing stairs? 0  Dressing or bathing? 0  Doing errands, shopping? 0  Preparing Food and eating ? N  Using the Toilet? N  In the past six months, have you accidently leaked urine? N  Do you have problems with loss of bowel control? N  Managing your Medications? N  Managing your Finances? N  Housekeeping or managing your Housekeeping? N    Patient Care Team: Pincus Sanes, MD as PCP - General (Internal Medicine) Rachael Fee, MD (Gastroenterology) Osborn Coho, MD (Inactive) (Otolaryngology) Etter Sjogren, MD (Plastic Surgery) Elmon Else, MD (Dermatology) Cyndia Bent, MD (Inactive) (General Surgery) Maxie Barb, MD as Consulting Physician (Nephrology)  Indicate any recent Medical Services you may have received from other than [facility] providers in the past year (date may be approximate).     Assessment:   This is a routine wellness examination for Lucyle.  Hearing/Vision screen Hearing Screening - Comments::  Asymmetric SNHL (sensorineural hearing loss) Vision Screening - Comments:: Wears eyeglasses   Goals Addressed  This  Visit's Progress    Patient Stated   On track    Patient would just like to continue to stay healthy.       Depression Screen    08/23/2023    9:07 AM 02/10/2023    9:09 AM 07/13/2022    9:16 AM 12/15/2021    1:31 PM 06/27/2021   10:05 AM 04/04/2020   10:02 AM 03/31/2019    8:29 AM  PHQ 2/9 Scores  PHQ - 2 Score 0 0 0 0 0 0 0  PHQ- 9 Score 0 0         Fall Risk    08/23/2023    9:03 AM 02/10/2023    9:08 AM 07/13/2022    9:17 AM 12/15/2021    1:30 PM 06/18/2021    9:41 AM  Fall Risk   Falls in the past year? 0 0 0 1 0  Number falls in past yr: 0 0 0 1 0  Injury with Fall? 0 0 0 0 0  Risk for fall due to : No Fall Risks No Fall Risks No Fall Risks No Fall Risks No Fall Risks  Follow up Falls prevention discussed;Falls evaluation completed Falls evaluation completed Falls evaluation completed Falls evaluation completed Falls evaluation completed    MEDICARE RISK AT HOME: Medicare Risk at Home Any stairs in or around the home?: Yes If so, are there any without handrails?: Yes Home free of loose throw rugs in walkways, pet beds, electrical cords, etc?: Yes Adequate lighting in your home to reduce risk of falls?: Yes Life alert?: No Use of a cane, walker or w/c?: No Grab bars in the bathroom?: No Shower chair or bench in shower?: Yes Elevated toilet seat or a handicapped toilet?: Yes  TIMED UP AND GO:  Was the test performed?  No    Cognitive Function:    03/31/2019    8:54 AM  MMSE - Mini Mental State Exam  Orientation to time 5  Orientation to Place 5  Registration 3  Attention/ Calculation 5  Recall 3  Language- name 2 objects 2  Language- repeat 1  Language- follow 3 step command 3  Language- read & follow direction 1  Write a sentence 1  Copy design 1  Total score 30        08/23/2023    9:04 AM 07/13/2022    9:18 AM  6CIT Screen  What Year? 0 points 0 points  What month? 0 points 0 points  What time? 0 points 0 points  Count back from 20 0 points 0  points  Months in reverse 0 points 0 points  Repeat phrase 0 points 0 points  Total Score 0 points 0 points    Immunizations Immunization History  Administered Date(s) Administered   Fluad Quad(high Dose 65+) 08/19/2023   Influenza Split 09/28/2011   Influenza, High Dose Seasonal PF 08/11/2017, 09/09/2018   Influenza, Seasonal, Injecte, Preservative Fre 10/21/2012   Influenza,inj,Quad PF,6+ Mos 08/31/2013, 08/24/2014   Influenza-Unspecified 08/03/2015, 09/08/2016, 08/11/2017   PFIZER(Purple Top)SARS-COV-2 Vaccination 11/28/2019, 12/19/2019   Pneumococcal Conjugate-13 11/14/2014   Pneumococcal Polysaccharide-23 03/31/2019   Td 11/02/2005   Tdap 12/05/2015   Zoster Recombinant(Shingrix) 06/01/2018, 08/05/2018   Zoster, Live 10/21/2012    TDAP status: Up to date  Flu Vaccine status: Up to date  Pneumococcal vaccine status: Up to date  Covid-19 vaccine status: Completed vaccines  Qualifies for Shingles Vaccine? Yes   Zostavax completed Yes   Shingrix Completed?: Yes  Screening Tests Health Maintenance  Topic Date Due   COVID-19 Vaccine (3 - Pfizer risk series) 01/16/2020   DEXA SCAN  06/22/2024   Medicare Annual Wellness (AWV)  08/22/2024   MAMMOGRAM  08/13/2025   DTaP/Tdap/Td (3 - Td or Tdap) 12/04/2025   Colonoscopy  10/03/2026   Pneumonia Vaccine 13+ Years old  Completed   INFLUENZA VACCINE  Completed   Hepatitis C Screening  Completed   Zoster Vaccines- Shingrix  Completed   HPV VACCINES  Aged Out    Health Maintenance  Health Maintenance Due  Topic Date Due   COVID-19 Vaccine (3 - Pfizer risk series) 01/16/2020    Colorectal cancer screening: Type of screening: Colonoscopy. Completed 10/03/2021. Repeat every 5 years  Mammogram status: Completed 08/14/2023. Repeat every year  Bone Density status: Completed 06/23/2019. Results reflect: Bone density results: NORMAL. Repeat every 2 years.  Lung Cancer Screening: (Low Dose CT Chest recommended if Age 73-80  years, 20 pack-year currently smoking OR have quit w/in 15years.) does not qualify.   Lung Cancer Screening Referral: N/A  Additional Screening:  Hepatitis C Screening: does qualify; Completed 11/25/2015  Vision Screening: Recommended annual ophthalmology exams for early detection of glaucoma and other disorders of the eye. Is the patient up to date with their annual eye exam?  Yes  Who is the provider or what is the name of the office in which the patient attends annual eye exams? [facility] If pt is not established with a provider, would they like to be referred to a provider to establish care? No .   Dental Screening: Recommended annual dental exams for proper oral hygiene   Community Resource Referral / Chronic Care Management: CRR required this visit?  No   CCM required this visit?  No     Plan:     I have personally reviewed and noted the following in the patient's chart:   Medical and social history Use of alcohol, tobacco or illicit drugs  Current medications and supplements including opioid prescriptions. Patient is not currently taking opioid prescriptions. Functional ability and status Nutritional status Physical activity Advanced directives List of other physicians Hospitalizations, surgeries, and ER visits in previous 12 months Vitals Screenings to include cognitive, depression, and falls Referrals and appointments  In addition, I have reviewed and discussed with patient certain preventive protocols, quality metrics, and best practice recommendations. A written personalized care plan for preventive services as well as general preventive health recommendations were provided to patient.     Masyn Fullam L Manasa Spease, CMA   08/23/2023   After Visit Summary: (MyChart) Due to this being a telephonic visit, the after visit summary with patients personalized plan was offered to patient via MyChart   Nurse Notes: Patient stated that she had a recent Covid vaccine,  however, it is not recorded in [facility] as of yet.  Patient had no concerns to address today.

## 2023-08-27 ENCOUNTER — Ambulatory Visit
Admission: RE | Admit: 2023-08-27 | Discharge: 2023-08-27 | Disposition: A | Discharge status: Home / Self Care | Payer: Medicare Other | Source: Ambulatory Visit | Attending: Internal Medicine | Admitting: Internal Medicine

## 2023-08-27 ENCOUNTER — Ambulatory Visit
Admission: RE | Admit: 2023-08-27 | Discharge: 2023-08-27 | Disposition: A | Discharge status: Home / Self Care | Payer: Medicare Other | Source: Ambulatory Visit | Attending: Internal Medicine

## 2023-08-27 ENCOUNTER — Other Ambulatory Visit: Payer: Self-pay | Admitting: Internal Medicine

## 2023-08-27 DIAGNOSIS — N6312 Unspecified lump in the right breast, upper inner quadrant: Secondary | ICD-10-CM | POA: Diagnosis not present

## 2023-08-27 DIAGNOSIS — R928 Other abnormal and inconclusive findings on diagnostic imaging of breast: Secondary | ICD-10-CM

## 2023-08-27 DIAGNOSIS — N6310 Unspecified lump in the right breast, unspecified quadrant: Secondary | ICD-10-CM

## 2023-09-20 ENCOUNTER — Telehealth: Payer: Self-pay | Admitting: Cardiology

## 2023-09-20 DIAGNOSIS — N289 Disorder of kidney and ureter, unspecified: Secondary | ICD-10-CM

## 2023-09-20 DIAGNOSIS — R7303 Prediabetes: Secondary | ICD-10-CM

## 2023-09-20 DIAGNOSIS — I10 Essential (primary) hypertension: Secondary | ICD-10-CM

## 2023-09-20 DIAGNOSIS — E782 Mixed hyperlipidemia: Secondary | ICD-10-CM

## 2023-09-20 NOTE — Telephone Encounter (Signed)
Patient wants to know if she will need to do lab work prior to her visit on 12/4.

## 2023-09-22 NOTE — Telephone Encounter (Signed)
Pt aware that fasting labs have been ordered for upcoming appointment.

## 2023-09-29 ENCOUNTER — Telehealth: Payer: Self-pay

## 2023-09-29 ENCOUNTER — Ambulatory Visit: Payer: Medicare Other | Admitting: Cardiology

## 2023-09-29 ENCOUNTER — Telehealth: Payer: Self-pay | Admitting: Cardiology

## 2023-09-29 DIAGNOSIS — N289 Disorder of kidney and ureter, unspecified: Secondary | ICD-10-CM

## 2023-09-29 LAB — COMPREHENSIVE METABOLIC PANEL
ALT: 12 [IU]/L (ref 0–32)
AST: 21 [IU]/L (ref 0–40)
Albumin: 4.3 g/dL (ref 3.8–4.8)
Alkaline Phosphatase: 130 [IU]/L — ABNORMAL HIGH (ref 44–121)
BUN/Creatinine Ratio: 16 (ref 12–28)
BUN: 36 mg/dL — ABNORMAL HIGH (ref 8–27)
Bilirubin Total: 0.3 mg/dL (ref 0.0–1.2)
CO2: 21 mmol/L (ref 20–29)
Calcium: 9.6 mg/dL (ref 8.7–10.3)
Chloride: 111 mmol/L — ABNORMAL HIGH (ref 96–106)
Creatinine, Ser: 2.3 mg/dL — ABNORMAL HIGH (ref 0.57–1.00)
Globulin, Total: 1.8 g/dL (ref 1.5–4.5)
Glucose: 98 mg/dL (ref 70–99)
Potassium: 5.4 mmol/L — ABNORMAL HIGH (ref 3.5–5.2)
Sodium: 147 mmol/L — ABNORMAL HIGH (ref 134–144)
Total Protein: 6.1 g/dL (ref 6.0–8.5)
eGFR: 22 mL/min/{1.73_m2} — ABNORMAL LOW (ref >59)

## 2023-09-29 LAB — CBC
Hematocrit: 36.7 % (ref 34.0–46.6)
Hemoglobin: 11.6 g/dL (ref 11.1–15.9)
MCH: 28.5 pg (ref 26.6–33.0)
MCHC: 31.6 g/dL (ref 31.5–35.7)
MCV: 90 fL (ref 79–97)
Platelets: 244 10*3/uL (ref 150–450)
RBC: 4.07 x10E6/uL (ref 3.77–5.28)
RDW: 12.4 % (ref 11.7–15.4)
WBC: 6.2 10*3/uL (ref 3.4–10.8)

## 2023-09-29 LAB — HEMOGLOBIN A1C
Est. average glucose Bld gHb Est-mCnc: 126 mg/dL
Hgb A1c MFr Bld: 6 % — ABNORMAL HIGH (ref 4.8–5.6)

## 2023-09-29 LAB — LIPID PANEL
Chol/HDL Ratio: 2.8 {ratio} (ref 0.0–4.4)
Cholesterol, Total: 152 mg/dL (ref 100–199)
HDL: 54 mg/dL (ref >39)
LDL Chol Calc (NIH): 73 mg/dL (ref 0–99)
Triglycerides: 143 mg/dL (ref 0–149)
VLDL Cholesterol Cal: 25 mg/dL (ref 5–40)

## 2023-09-29 LAB — HEPATIC FUNCTION PANEL: Bilirubin, Direct: 0.13 mg/dL (ref 0.00–0.40)

## 2023-09-29 LAB — TSH: TSH: 1.15 u[IU]/mL (ref 0.450–4.500)

## 2023-09-29 MED ORDER — SODIUM POLYSTYRENE SULFONATE PO POWD: Freq: Once | ORAL | 0 refills | Status: AC

## 2023-09-29 NOTE — Telephone Encounter (Signed)
Results reviewed with pt as per Dr. Revankar's note.  Pt verbalized understanding and had no additional questions. Routed to PCP.  

## 2023-09-29 NOTE — Telephone Encounter (Signed)
-----   Message from Aundra Dubin Revankar sent at 09/29/2023 11:00 AM EST ----- Lipids are fine.  Worsening kidney function.  Potassium is elevated.  Kayexalate 30 g one-time dose only and recheck in a week.  Copy primary care. Garwin Brothers, MD 09/29/2023 11:00 AM

## 2023-09-29 NOTE — Telephone Encounter (Signed)
Pt returning nurses phone call regarding results. Please advise.

## 2023-10-05 DIAGNOSIS — N289 Disorder of kidney and ureter, unspecified: Secondary | ICD-10-CM | POA: Diagnosis not present

## 2023-10-06 ENCOUNTER — Encounter: Payer: Self-pay | Admitting: Cardiology

## 2023-10-06 ENCOUNTER — Ambulatory Visit: Payer: Medicare Other | Attending: Cardiology | Admitting: Cardiology

## 2023-10-06 VITALS — BP 122/68 | HR 66 | Ht 63.6 in | Wt 140.1 lb

## 2023-10-06 DIAGNOSIS — I700 Atherosclerosis of aorta: Secondary | ICD-10-CM

## 2023-10-06 DIAGNOSIS — N289 Disorder of kidney and ureter, unspecified: Secondary | ICD-10-CM | POA: Insufficient documentation

## 2023-10-06 DIAGNOSIS — I10 Essential (primary) hypertension: Secondary | ICD-10-CM | POA: Insufficient documentation

## 2023-10-06 DIAGNOSIS — E782 Mixed hyperlipidemia: Secondary | ICD-10-CM | POA: Diagnosis not present

## 2023-10-06 HISTORY — DX: Atherosclerosis of aorta: I70.0

## 2023-10-06 LAB — BASIC METABOLIC PANEL
BUN/Creatinine Ratio: 17 (ref 12–28)
BUN: 31 mg/dL — ABNORMAL HIGH (ref 8–27)
CO2: 22 mmol/L (ref 20–29)
Calcium: 9.5 mg/dL (ref 8.7–10.3)
Chloride: 107 mmol/L — ABNORMAL HIGH (ref 96–106)
Creatinine, Ser: 1.81 mg/dL — ABNORMAL HIGH (ref 0.57–1.00)
Glucose: 93 mg/dL (ref 70–99)
Potassium: 4.4 mmol/L (ref 3.5–5.2)
Sodium: 144 mmol/L (ref 134–144)
eGFR: 30 mL/min/{1.73_m2} — ABNORMAL LOW (ref >59)

## 2023-10-06 NOTE — Progress Notes (Signed)
Cardiology Office Note:    Date:  10/06/2023   ID:  Sarah Valentine, DOB 1951/10/17, MRN [id number]  PCP:  Pincus Sanes, MD  Cardiologist:  Garwin Brothers, MD   Referring MD: Pincus Sanes, MD    ASSESSMENT:    1. Primary hypertension   2. Renal insufficiency   3. Mixed hyperlipidemia   4. Aortic atherosclerosis (HCC)    PLAN:    In order of problems listed above:  Aortic atherosclerosis: Secondary prevention stressed with the patient.  Importance of compliance with diet medication stressed any vocalized understanding.  She was advised to walk at least half an hour a day 5 days a week and she promises to do so. Essential hypertension: Blood pressure is stable and diet was emphasized. Mixed dyslipidemia: Could not tolerate rosuvastatin.  She will call us in a month.  Will initiate pitavastatin 1 or 2 mg daily and follow-up with blood work.  She is agreeable.  If this does not work that she may be a candidate for other medications such as Nexlizet. Patient will be seen in follow-up appointment in 12 months or earlier if the patient has any concerns.    Medication Adjustments/Labs and Tests Ordered: Current medicines are reviewed at length with the patient today.  Concerns regarding medicines are outlined above.  No orders of the defined types were placed in this encounter.  No orders of the defined types were placed in this encounter.    No chief complaint on file.    History of Present Illness:    Sarah Valentine is a 72 y.o. female.  Patient has past medical history of aortic atherosclerosis, mixed dyslipidemia.  She has essential hypertension.  She takes care of activities of daily living.  She mentions to me that Crestor was working well for her but then she started having bodyaches.  She has stopped her Crestor a few weeks ago.  She is feeling much better now.  She denies any chest pain orthopnea or PND.  At the time of my evaluation, the patient is alert awake  oriented and in no distress.  Past Medical History:  Diagnosis Date   Acute cystitis without hematuria 08/27/2017   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   Barrett's esophagus    BCC (basal cell carcinoma), trunk 08/2013   removed from back (gould)   Cardiac murmur 12/29/2022   COVID-19 04/09/2021   Family history of breast cancer 11/25/2015   Multiple relatives  3D mammo annually  MRI of breast every other year - has been covered  Does not follow with Gyn  Will get MRI next year - 2018   Frequent UTI 08/29/2018   History of colonic polyps 09/05/2010   Qualifier: Diagnosis of   By: Melvyn Neth CMA Duncan Dull), Patty      IMO SNOMED Dx Update Oct 2024     History of melanoma    Melanoma in situ (L) buttock, 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 No further follow-up is necessary.   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   excised from back [facility])    Past Surgical History:  Procedure Laterality Date   ABDOMINAL HYSTERECTOMY  11/03/1991   BACK SURGERY  2020   and in 2021 due to herniated disc   BASAL CELL CARCINOMA EXCISION  08/02/2013   back   CHOLECYSTECTOMY  11/02/2008   COLONOSCOPY  2019   DJ-MAC-suprep(good)-tics/polyps x 5 (TA/SSP)   COLONOSCOPY W/ BIOPSIES AND POLYPECTOMY  12/03/2008   polyp removed   MELANOMA EXCISION Left 11/02/2009   buttock   Perinephtric abscess with peritonitis  11/02/1978   POLYPECTOMY  2019    TA and SSP   SQUAMOUS CELL CARCINOMA EXCISION  01/31/2014   back    Current Medications: Current Meds  Medication Sig   Cholecalciferol (VITAMIN D3) 50 MCG (2000 UT) capsule Take 2,000 Units by mouth daily.   Cranberry 1000 MG CAPS Take 500 mg by mouth daily.    estradiol (ESTRACE) 0.1 MG/GM vaginal cream Place 1 Applicatorful vaginally 2 (two) times a week.   olmesartan (BENICAR) 20 MG tablet Take 1 tablet (20 mg total) by mouth daily.   ondansetron (ZOFRAN) 4 MG tablet Take 1 tablet (4 mg total) by mouth every 8 (eight) hours as needed for nausea or vomiting.   rosuvastatin (CRESTOR) 10 MG tablet Take 1 tablet (10 mg total) by mouth daily.   sodium bicarbonate 650 MG tablet Take 650 mg by mouth 2 (two) times daily.     Allergies:   Augmentin [amoxicillin-pot clavulanate], Sulfamethoxazole-trimethoprim, and Sulfonamide derivatives   Social History   Socioeconomic History   Marital status: Married    Spouse name: Onalee Hua   Number of children: 3   Years of education: Not on file   Highest education level: Not on file  Occupational History   Occupation: Academic librarian: [employer]   Occupation: Retired  Tobacco Use   Smoking status: Never   Smokeless tobacco: Never  Vaping Use   Vaping status: Never Used  Substance and Sexual Activity   Alcohol use: No    Alcohol/week: 0.0 standard drinks of alcohol   Drug use: No   Sexual activity: Not on file  Other Topics Concern   Not on file  Social History Narrative   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   Married, lives with spouse - 3 daughters all in [place] area   [place] to [place] from [place] in 2010   Social Determinants of Health   Financial Resource Strain: Low Risk  (08/23/2023)   Overall Financial Resource Strain (CARDIA)    Difficulty of Paying Living Expenses: Not hard at all  Food Insecurity: No Food Insecurity  (08/23/2023)   Hunger Vital Sign    Worried About Running Out of Food in the Last Year: Never true    Ran Out of Food in the Last Year: Never true  Transportation Needs: No Transportation Needs (08/23/2023)   PRAPARE - Administrator, Civil Service (Medical): No    Lack of Transportation (Non-Medical): No  Physical Activity: Sufficiently Active (08/23/2023)   Exercise Vital Sign    Days of Exercise per Week: 7 days    Minutes of Exercise per Session: 40 min  Stress:  No Stress Concern Present (08/23/2023)   [employer] of Occupational Health - Occupational Stress Questionnaire    Feeling of Stress : Not at all  Social Connections: Moderately Integrated (08/23/2023)   Social Connection and Isolation Panel [NHANES]    Frequency of Communication with Friends and Family: More than three times a week    Frequency of Social Gatherings with Friends and Family: Three times a week    Attends Religious Services: 1 to 4 times per year    Active Member of Clubs or Organizations: No    Attends Engineer, structural: Never    Marital Status: Married     Family History: The patient's family history includes Arthritis in her father, maternal grandmother, mother, and another family member; Breast cancer in her cousin, maternal aunt, and maternal aunt; Breast cancer (age of onset: 67) in her cousin; Breast cancer (age of onset: 55) in her mother; Breast cancer (age of onset: 36) in her maternal grandmother; Colon cancer (age of onset: 28) in her maternal grandmother; Colon polyps (age of onset: 45) in her maternal grandmother; Congestive Heart Failure in her sister; Diabetes in her maternal grandmother and mother; Esophageal cancer (age of onset: 82) in her father; Esophageal cancer (age of onset: 27) in her paternal uncle; Heart disease in her mother; Hyperlipidemia in her mother; Hypertension in her maternal grandmother and mother; Kidney disease in her maternal grandmother, mother,  and another family member. There is no history of Rectal cancer, Stomach cancer, or Pancreatic cancer.  ROS:   Please see the history of present illness.    All other systems reviewed and are negative.  EKGs/Labs/Other Studies Reviewed:    The following studies were reviewed today: I discussed my findings with the patient at length.   Recent Labs: 09/28/2023: ALT 12; Hemoglobin 11.6; Platelets 244; TSH 1.150 10/05/2023: BUN 31; Creatinine, Ser 1.81; Potassium 4.4; Sodium 144  Recent Lipid Panel    Component Value Date/Time   CHOL 152 09/28/2023 0902   TRIG 143 09/28/2023 0902   HDL 54 09/28/2023 0902   CHOLHDL 2.8 09/28/2023 0902   CHOLHDL 4 06/18/2021 1022   VLDL 29.4 06/18/2021 1022   LDLCALC 73 09/28/2023 0902   LDLDIRECT 171.3 10/20/2013 0852    Physical Exam:    VS:  BP 122/68   Pulse 66   Ht 5' 3.6" (1.615 m)   Wt 140 lb 1.9 oz (63.6 kg)   SpO2 94%   BMI 24.36 kg/m     Wt Readings from Last 3 Encounters:  10/06/23 140 lb 1.9 oz (63.6 kg)  08/23/23 137 lb (62.1 kg)  06/15/23 137 lb (62.1 kg)     GEN: Patient is in no acute distress HEENT: Normal NECK: No JVD; No carotid bruits LYMPHATICS: No lymphadenopathy CARDIAC: Hear sounds regular, 2/6 systolic murmur at the apex. RESPIRATORY:  Clear to auscultation without rales, wheezing or rhonchi  ABDOMEN: Soft, non-tender, non-distended MUSCULOSKELETAL:  No edema; No deformity  SKIN: Warm and dry NEUROLOGIC:  Alert and oriented x 3 PSYCHIATRIC:  Normal affect   Signed, Garwin Brothers, MD  10/06/2023 9:00 AM    [facility]

## 2023-10-06 NOTE — Patient Instructions (Signed)

## 2023-10-14 ENCOUNTER — Ambulatory Visit
Admission: RE | Admit: 2023-10-14 | Discharge: 2023-10-14 | Disposition: A | Discharge status: Home / Self Care | Payer: Medicare Other | Source: Ambulatory Visit | Attending: Family Medicine | Admitting: Family Medicine

## 2023-10-14 ENCOUNTER — Other Ambulatory Visit: Payer: Self-pay

## 2023-10-14 VITALS — BP 120/76 | HR 65 | Temp 98.1°F | Resp 16

## 2023-10-14 DIAGNOSIS — H00021 Hordeolum internum right upper eyelid: Secondary | ICD-10-CM | POA: Diagnosis not present

## 2023-10-14 MED ORDER — CEFDINIR 300 MG PO CAPS: 300.0000 mg | ORAL_CAPSULE | Freq: Two times a day (BID) | ORAL | 0 refills | Status: DC

## 2023-10-14 NOTE — ED Triage Notes (Signed)
Right eyelid swelling, redness, pain x approx 1 week. Bought new tube of mascara and it started 1-2 days after using it.

## 2023-10-14 NOTE — Discharge Instructions (Addendum)
Take antibiotic 2 times a day Continue warm compresses for 10 to 20 minutes 4 times a day Massage eyelid after compresses May use a lubricant eyedrop if needed for comfort See your doctor if not improving by Monday

## 2023-10-14 NOTE — ED Provider Notes (Signed)
[facility]    CSN: [id number] Arrival date & time: 10/14/23  1105      History   Chief Complaint Chief Complaint  Patient presents with   Eye Problem    HPI Sarah Valentine is a 72 y.o. female.   Patient states that she had some new mascara.  She thinks this might of irritated her eye.  In any event she has redness swelling and pain in her right upper eyelid.  Is been going on for about a week.  She states is getting worse.  At first there was a small bump near the lash line, now the whole eyelid is red.  She has tried some erythromycin ointment.  She has used a warm compress 1 time.  She is concerned for worsening infection her eyelid.  Vision is not impaired    Past Medical History:  Diagnosis Date   Acute cystitis without hematuria 08/27/2017   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   Barrett's esophagus    BCC (basal cell carcinoma), trunk 08/2013   removed from back (gould)   Cardiac murmur 12/29/2022   COVID-19 04/09/2021   Family history of breast cancer 11/25/2015   Multiple relatives  3D mammo annually  MRI of breast every other year - has been covered  Does not follow with Gyn  Will get MRI next year - 2018   Frequent UTI 08/29/2018   History of colonic polyps 09/05/2010   Qualifier: Diagnosis of   By: Melvyn Neth CMA Duncan Dull), Patty      IMO SNOMED Dx Update Oct 2024     History of melanoma    Melanoma in situ (L) buttock, 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 No further follow-up is necessary.   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   excised from back [facility])    Patient Active Problem List   Diagnosis Date Noted   Aortic atherosclerosis (HCC) 10/06/2023   Renal insufficiency 12/29/2022   Cardiac murmur 12/29/2022   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   Right elbow pain 03/26/2022   RUQ discomfort 10/06/2021   COVID-19 04/09/2021   Lumbar radiculopathy 05/09/2019   Frequent UTI 08/29/2018   Acute cystitis without hematuria 08/27/2017   Prediabetes 10/07/2016   Lung nodule, solitary - benign, no f/u 10/07/2016   Family history of breast cancer 11/25/2015   Nephritis, hereditary 11/14/2014   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   Incomplete RBBB    BCC (basal cell carcinoma), trunk 08/2013   Hypertension    Hyperlipidemia    History of melanoma    Migraines    BARRETTS ESOPHAGUS 09/05/2010   History of colonic polyps 09/05/2010   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HCC) 2011   Hx of adenomatous colonic polyps 12/2008    Past Surgical History:  Procedure Laterality Date   ABDOMINAL HYSTERECTOMY  11/03/1991   BACK SURGERY  2020   and in 2021 due to herniated disc   BASAL CELL CARCINOMA EXCISION  08/02/2013   back   CHOLECYSTECTOMY  11/02/2008   COLONOSCOPY  2019   DJ-MAC-suprep(good)-tics/polyps x 5 (TA/SSP)   COLONOSCOPY W/ BIOPSIES AND POLYPECTOMY  12/03/2008   polyp removed   MELANOMA EXCISION Left 11/02/2009   buttock   Perinephtric abscess with peritonitis  11/02/1978   POLYPECTOMY  2019   TA and SSP   SQUAMOUS CELL CARCINOMA EXCISION  01/31/2014   back    OB History   No obstetric history on file.      Home Medications    Prior to Admission medications   Medication Sig Start Date End Date Taking? Authorizing Provider  cefdinir (OMNICEF) 300 MG capsule Take 1 capsule (300 mg total) by mouth 2 (two) times daily.  10/14/23  Yes Eustace Moore, MD  Cholecalciferol (VITAMIN D3) 50 MCG (2000 UT) capsule Take 2,000 Units by mouth daily.    [provider]  Cranberry 1000 MG CAPS Take 500 mg by mouth daily.     [provider]  estradiol (ESTRACE) 0.1 MG/GM vaginal cream Place 1 Applicatorful vaginally 2 (two) times a week. 06/01/22   Pincus Sanes, MD  olmesartan (BENICAR) 20 MG tablet Take 1 tablet (20 mg total) by mouth daily. 06/15/23   Pincus Sanes, MD  ondansetron (ZOFRAN) 4 MG tablet Take 1 tablet (4 mg total) by mouth every 8 (eight) hours as needed for nausea or vomiting. 06/15/23   Pincus Sanes, MD  rosuvastatin (CRESTOR) 10 MG tablet Take 1 tablet (10 mg total) by mouth daily. 01/13/23 01/08/24  Revankar, Aundra Dubin, MD  sodium bicarbonate 650 MG tablet Take 650 mg by mouth 2 (two) times daily. 12/30/20   [provider]    Family History Family History  Problem Relation Age of Onset   Breast cancer Mother 49   Arthritis Mother    Hyperlipidemia Mother    Heart disease Mother    Hypertension Mother    Kidney disease Mother    Diabetes Mother    Arthritis Father    Esophageal cancer Father 41   Congestive Heart Failure Sister    Breast cancer Maternal Aunt        80s   Breast cancer Maternal Aunt        28s   Esophageal cancer Paternal Uncle 65   Colon cancer Maternal Grandmother 78   Colon polyps Maternal Grandmother 78   Breast cancer Maternal Grandmother 72   Arthritis Maternal Grandmother    Hypertension Maternal Grandmother    Kidney disease Maternal Grandmother    Diabetes Maternal Grandmother    Breast cancer Cousin 42   Breast cancer Cousin        39s   Arthritis Other    Kidney disease Other        [place] nephritis -pt states mostly maternal side of family as the disease   Rectal cancer Neg Hx    Stomach cancer Neg Hx    Pancreatic cancer Neg Hx     Social History Social History   Tobacco Use   Smoking status: Never   Smokeless tobacco:  Never  Vaping Use   Vaping status: Never Used  Substance Use Topics   Alcohol use: No    Alcohol/week: 0.0 standard drinks of alcohol   Drug use: No     Allergies   Augmentin [amoxicillin-pot clavulanate], Rosuvastatin, Sulfamethoxazole-trimethoprim,  and Sulfonamide derivatives   Review of Systems Review of Systems  See HPI Physical Exam Triage Vital Signs ED Triage Vitals  Encounter Vitals Group     BP 10/14/23 1107 120/76     Systolic BP Percentile --      Diastolic BP Percentile --      Pulse Rate 10/14/23 1107 65     Resp 10/14/23 1107 16     Temp 10/14/23 1107 98.1 F (36.7 C)     Temp src --      SpO2 10/14/23 1107 99 %     Weight --      Height --      Head Circumference --      Peak Flow --      Pain Score 10/14/23 1110 2     Pain Loc --      Pain Education --      Exclude from Growth Chart --    No data found.  Updated Vital Signs BP 120/76   Pulse 65   Temp 98.1 F (36.7 C)   Resp 16   SpO2 99%      Physical Exam Constitutional:      General: She is not in acute distress.    Appearance: She is well-developed.  HENT:     Head: Normocephalic and atraumatic.  Eyes:     Conjunctiva/sclera: Conjunctivae normal.     Pupils: Pupils are equal, round, and reactive to light.     Comments: Right eyelid is puffy and erythematous.  Tender to touch.  Internal hordeolum noted in the center above the lash line  Cardiovascular:     Rate and Rhythm: Normal rate.  Pulmonary:     Effort: Pulmonary effort is normal. No respiratory distress.  Abdominal:     General: There is no distension.     Palpations: Abdomen is soft.  Musculoskeletal:        General: Normal range of motion.     Cervical back: Normal range of motion.  Skin:    General: Skin is warm and dry.  Neurological:     Mental Status: She is alert.      UC Treatments / Results  Labs (all labs ordered are listed, but only abnormal results are displayed) Labs Reviewed - No data to  display  EKG   Radiology No results found.  Procedures Procedures (including critical care time)  Medications Ordered in UC Medications - No data to display  Initial Impression / Assessment and Plan / UC Course  I have reviewed the triage vital signs and the nursing notes.  Pertinent labs & imaging results that were available during my care of the patient were reviewed by me and considered in my medical decision making (see chart for details).     Patient has failed topical antibiotics although she is only used them a couple of days.  Discussed that with the spreading infection and and involvement of her whole bed, antibiotics by mouth may be a better choice. Final Clinical Impressions(s) / UC Diagnoses   Final diagnoses:  Hordeolum internum of right upper eyelid     Discharge Instructions      Take antibiotic 2 times a day Continue warm compresses for 10 to 20 minutes 4 times a day Massage eyelid after compresses May use a lubricant eyedrop if needed for comfort See your doctor if not improving by Monday   ED Prescriptions     Medication Sig Dispense Auth. Provider   cefdinir (OMNICEF)  300 MG capsule Take 1 capsule (300 mg total) by mouth 2 (two) times daily. 14 capsule Eustace Moore, MD      PDMP not reviewed this encounter.   Eustace Moore, MD 10/14/23 [phone]

## 2023-11-08 DIAGNOSIS — N952 Postmenopausal atrophic vaginitis: Secondary | ICD-10-CM | POA: Diagnosis not present

## 2023-11-08 DIAGNOSIS — N3020 Other chronic cystitis without hematuria: Secondary | ICD-10-CM | POA: Diagnosis not present

## 2023-12-06 ENCOUNTER — Encounter: Payer: Self-pay | Admitting: Cardiology

## 2023-12-06 DIAGNOSIS — E782 Mixed hyperlipidemia: Secondary | ICD-10-CM

## 2023-12-06 DIAGNOSIS — I700 Atherosclerosis of aorta: Secondary | ICD-10-CM

## 2023-12-06 MED ORDER — PITAVASTATIN CALCIUM 1 MG PO TABS: 1.0000 mg | ORAL_TABLET | Freq: Every day | ORAL | 3 refills | Status: DC

## 2023-12-10 ENCOUNTER — Telehealth: Payer: Self-pay | Admitting: Pharmacy Technician

## 2023-12-10 ENCOUNTER — Telehealth: Payer: Self-pay | Admitting: Cardiology

## 2023-12-10 ENCOUNTER — Other Ambulatory Visit (HOSPITAL_COMMUNITY): Payer: Self-pay

## 2023-12-10 MED ORDER — PITAVASTATIN CALCIUM 1 MG PO TABS: 1.0000 mg | ORAL_TABLET | Freq: Every day | ORAL | 3 refills | Status: DC

## 2023-12-10 NOTE — Telephone Encounter (Signed)
 Pt c/o medication issue:  1. Name of Medication:   Pitavastatin  Calcium  1 MG TABS    2. How are you currently taking this medication (dosage and times per day)? New script  3. Are you having a reaction (difficulty breathing--STAT)? No   4. What is your medication issue? Pt received email from [employer] that they need more imformation from us . They told her they have contacted us  but we have not responded so the have cx order #[id number]

## 2023-12-10 NOTE — Telephone Encounter (Signed)
 Pharmacy Patient Advocate Encounter   Received notification from Pt Calls Messages that prior authorization for Pitavastatin  Calcium  1MG  tablets is required/requested.   Insurance verification completed.   The patient is insured through [employer] .   Per test claim: PA required; PA submitted to above mentioned insurance via CoverMyMeds Key/confirmation #/EOC [id number] Status is pending

## 2023-12-10 NOTE — Telephone Encounter (Signed)
*  STAT* If patient is at the pharmacy, call can be transferred to refill team.   1. Which medications need to be refilled? (please list name of each medication and dose if known) Pitavastatin  Calcium  1 MG TABS   2. Which pharmacy/location (including street and city if local pharmacy) is medication to be sent to? [facility] - [place], [place] - [address] [place]   3. Do they need a 30 day or 90 day supply? 90  Patient states the pharmacy didn't receive the medication.

## 2023-12-13 MED ORDER — PITAVASTATIN CALCIUM 1 MG PO TABS: 1.0000 mg | ORAL_TABLET | Freq: Every day | ORAL | 3 refills | Status: DC

## 2023-12-13 NOTE — Telephone Encounter (Signed)
 Patient is following up. She states she checked her account and noticed the PA has been approved, but they are needing a new prescription.

## 2023-12-13 NOTE — Telephone Encounter (Signed)
 Refill has been sent and pt aware.

## 2023-12-13 NOTE — Telephone Encounter (Signed)
 Pharmacy Patient Advocate Encounter  Received notification from [facility] that Prior Authorization for Pitavastatin  Calcium  1MG  tablets has been APPROVED from 12/10/23 to 11/01/24   PA #/Case ID/Reference #: [id number].

## 2023-12-13 NOTE — Addendum Note (Signed)
 Addended by: Einar Grave on: 12/13/2023 01:46 PM   Modules accepted: Orders

## 2024-01-04 DIAGNOSIS — Z87898 Personal history of other specified conditions: Secondary | ICD-10-CM | POA: Diagnosis not present

## 2024-01-04 DIAGNOSIS — Z808 Family history of malignant neoplasm of other organs or systems: Secondary | ICD-10-CM | POA: Diagnosis not present

## 2024-01-04 DIAGNOSIS — D2272 Melanocytic nevi of left lower limb, including hip: Secondary | ICD-10-CM | POA: Diagnosis not present

## 2024-01-04 DIAGNOSIS — D225 Melanocytic nevi of trunk: Secondary | ICD-10-CM | POA: Diagnosis not present

## 2024-01-04 DIAGNOSIS — L821 Other seborrheic keratosis: Secondary | ICD-10-CM | POA: Diagnosis not present

## 2024-01-04 DIAGNOSIS — D2262 Melanocytic nevi of left upper limb, including shoulder: Secondary | ICD-10-CM | POA: Diagnosis not present

## 2024-01-04 DIAGNOSIS — L578 Other skin changes due to chronic exposure to nonionizing radiation: Secondary | ICD-10-CM | POA: Diagnosis not present

## 2024-01-04 DIAGNOSIS — Z86018 Personal history of other benign neoplasm: Secondary | ICD-10-CM | POA: Diagnosis not present

## 2024-01-04 DIAGNOSIS — D2271 Melanocytic nevi of right lower limb, including hip: Secondary | ICD-10-CM | POA: Diagnosis not present

## 2024-01-31 DIAGNOSIS — N184 Chronic kidney disease, stage 4 (severe): Secondary | ICD-10-CM | POA: Diagnosis not present

## 2024-02-03 DIAGNOSIS — I129 Hypertensive chronic kidney disease with stage 1 through stage 4 chronic kidney disease, or unspecified chronic kidney disease: Secondary | ICD-10-CM | POA: Diagnosis not present

## 2024-02-03 DIAGNOSIS — N2581 Secondary hyperparathyroidism of renal origin: Secondary | ICD-10-CM | POA: Diagnosis not present

## 2024-02-03 DIAGNOSIS — E872 Acidosis, unspecified: Secondary | ICD-10-CM | POA: Diagnosis not present

## 2024-02-03 DIAGNOSIS — D631 Anemia in chronic kidney disease: Secondary | ICD-10-CM | POA: Diagnosis not present

## 2024-02-03 DIAGNOSIS — N189 Chronic kidney disease, unspecified: Secondary | ICD-10-CM | POA: Diagnosis not present

## 2024-02-03 DIAGNOSIS — N184 Chronic kidney disease, stage 4 (severe): Secondary | ICD-10-CM | POA: Diagnosis not present

## 2024-02-03 DIAGNOSIS — E559 Vitamin D deficiency, unspecified: Secondary | ICD-10-CM | POA: Diagnosis not present

## 2024-02-04 ENCOUNTER — Ambulatory Visit: Payer: Medicare Other | Attending: Internal Medicine | Admitting: Pharmacist

## 2024-02-04 DIAGNOSIS — E7849 Other hyperlipidemia: Secondary | ICD-10-CM | POA: Insufficient documentation

## 2024-02-04 MED ORDER — ATORVASTATIN CALCIUM 10 MG PO TABS: 10.0000 mg | ORAL_TABLET | Freq: Every day | ORAL | 3 refills | Status: DC

## 2024-02-04 NOTE — Progress Notes (Signed)
 Patient ID: Sarah Valentine                 DOB: [birth year]                    MRN: [id number]      HPI: Sarah Valentine is a 73 y.o. female patient referred to lipid clinic by Revankar. PMH is significant for HTN, HLD, aortic atherosclerosis, CKD.  Patient with body aches on rosuvastatin. PA for pitivastatin was approved but copay was too high. Patient referred to lipid clinic. Appears baseline LDL-C is around 156.  Patient presents today to lipid clinic.  She reports that she had bilateral arm pain with rosuvastatin 10 mg a started about a month into therapy.  Pain mostly in her elbows.  Took 2 months to go away after stopping.  Of note patient does have CKD but she was not on the above recommended dose for rosuvastatin for her kidney function.  Dr. Tomie China tried to start her on pitivastatin but was going to cost over $100 for a 90-day supply.  It does appear that the generic was approved.  Her husband has metastatic cancer and is in a clinical trial in [place], therefore a lot of their expenses is flying back and forth to [facility] for he clinical trial.  He is doing well and seems to be benefiting from treatment.   It is a little unclear whether her prescription coverage is commercial or Medicare (her husband has coverage as a retiree from [employer]), but either way they are much less expensive statin options that she could try first.  10 year ASCVD risk score 8.4% using PREVENT 10 year CVD risk 15.2%  Discussed trial of either atorvastatin or pravastatin.  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   Barrett's esophagus    BCC (basal cell carcinoma), trunk 08/2013   removed from back (gould)   Cardiac murmur 12/29/2022   COVID-19 04/09/2021   Family history of breast cancer 11/25/2015   Multiple relatives  3D mammo annually  MRI of breast every other year - has been covered  Does not follow with Gyn  Will get MRI next year - 2018   Frequent UTI 08/29/2018   History of colonic polyps 09/05/2010   Qualifier: Diagnosis of   By: Melvyn Neth CMA Duncan Dull), Patty      IMO SNOMED Dx Update Oct 2024     History of melanoma    Melanoma in situ (L) buttock, 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 No further follow-up is necessary.  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 14 capsule 0   Cholecalciferol (VITAMIN D3) 50 MCG (2000 UT) capsule Take 2,000 Units by mouth daily.     Cranberry 1000 MG CAPS Take 500 mg by mouth daily.      estradiol (ESTRACE) 0.1 MG/GM vaginal cream Place 1 Applicatorful vaginally 2 (two) times a week. 42.5 g 11   olmesartan  (BENICAR) 20 MG tablet Take 1 tablet (20 mg total) by mouth daily. 90 tablet 3   ondansetron (ZOFRAN) 4 MG tablet Take 1 tablet (4 mg total) by mouth every 8 (eight) hours as needed for nausea or vomiting. 20 tablet 0   sodium bicarbonate 650 MG tablet Take 650 mg by mouth 2 (two) times daily.     No current facility-administered medications on file prior to visit.    Allergies  Allergen Reactions   Augmentin [Amoxicillin-Pot Clavulanate] Other (See Comments)    Abdominal pain, diarrhea   Rosuvastatin    Sulfamethoxazole-Trimethoprim Other (See Comments)   Sulfonamide Derivatives Itching and Rash    Assessment/Plan:  1. Hyperlipidemia -  Hyperlipidemia Assessment: LDL-C baseline around 156 Patient's risk factors include CKD, hypertension, age Did not tolerate rosuvastatin Discussed options of atorvastatin or pravastatin Prescribed pitivastatin by Dr. Tomie China, but too expensive.  Somewhat unclear if her pharmacy benefit is Medicare or commercial.  If it is commercial I do not think a co-pay card would work but is because it seems as though the generic was approved.  She is unsure if they are at the income limit if the plan were to be Medicare.  Either way there are several other less expensive option she could try.  Plan: Start atorvastatin 10 mg daily Repeat labs in 8 to 12 weeks    Thank you,  Olene Floss, Pharm.D, BCACP, CPP [facility] of [facility] [address], [place], [place] [address]  Phone: [phone]; Fax: [phone]

## 2024-02-04 NOTE — Patient Instructions (Signed)
 Start taking atorvastatin 10mg  daily Repeat labs in 8-12 weeks

## 2024-02-04 NOTE — Assessment & Plan Note (Signed)
 Assessment: LDL-C baseline around 156 Patient's risk factors include CKD, hypertension, age Did not tolerate rosuvastatin Discussed options of atorvastatin or pravastatin Prescribed pitivastatin by Dr. Tomie China, but too expensive.  Somewhat unclear if her pharmacy benefit is Medicare or commercial.  If it is commercial I do not think a co-pay card would work but is because it seems as though the generic was approved.  She is unsure if they are at the income limit if the plan were to be Medicare.  Either way there are several other less expensive option she could try.  Plan: Start atorvastatin 10 mg daily Repeat labs in 8 to 12 weeks

## 2024-02-28 ENCOUNTER — Ambulatory Visit
Admission: RE | Admit: 2024-02-28 | Discharge: 2024-02-28 | Disposition: A | Discharge status: Home / Self Care | Payer: Medicare Other | Source: Ambulatory Visit | Attending: Internal Medicine

## 2024-02-28 ENCOUNTER — Ambulatory Visit
Admission: RE | Admit: 2024-02-28 | Discharge: 2024-02-28 | Disposition: A | Discharge status: Home / Self Care | Payer: Medicare Other | Source: Ambulatory Visit | Attending: Internal Medicine | Admitting: Internal Medicine

## 2024-02-28 DIAGNOSIS — N6310 Unspecified lump in the right breast, unspecified quadrant: Secondary | ICD-10-CM

## 2024-02-28 DIAGNOSIS — N6312 Unspecified lump in the right breast, upper inner quadrant: Secondary | ICD-10-CM | POA: Diagnosis not present

## 2024-03-03 ENCOUNTER — Encounter: Payer: Self-pay | Admitting: Internal Medicine

## 2024-03-06 ENCOUNTER — Other Ambulatory Visit: Payer: Self-pay | Admitting: Internal Medicine

## 2024-03-06 DIAGNOSIS — N6001 Solitary cyst of right breast: Secondary | ICD-10-CM

## 2024-05-26 ENCOUNTER — Encounter: Payer: Self-pay | Admitting: Pharmacist

## 2024-06-13 ENCOUNTER — Encounter: Payer: Self-pay | Admitting: Internal Medicine

## 2024-06-13 NOTE — Progress Notes (Signed)
 Subjective:    Patient ID: Sarah Valentine, female    DOB: October 12, 1951, 73 y.o.   MRN: [id number]     HPI Deloros is here for follow up of her chronic medical problems.  Walking 4-5 miles a day.  Doing well - no concerns.  No changes in history since she was here last.  Medications and allergies reviewed with patient and updated if appropriate.  Current Outpatient Medications on File Prior to Visit  Medication Sig Dispense Refill   Cholecalciferol (VITAMIN D3) 50 MCG (2000 UT) capsule Take 2,000 Units by mouth daily.     Cranberry 1000 MG CAPS Take 500 mg by mouth daily.      sodium bicarbonate 650 MG tablet Take 650 mg by mouth 2 (two) times daily.     No current facility-administered medications on file prior to visit.     Review of Systems  Constitutional:  Negative for fever.  Respiratory:  Negative for cough, shortness of breath and wheezing.   Cardiovascular:  Positive for palpitations (occ - transient). Negative for chest pain and leg swelling.  Gastrointestinal:  Negative for abdominal pain.  Neurological:  Negative for light-headedness and headaches.       Objective:   Vitals:   06/14/24 0902  BP: 116/74  Pulse: 74  Temp: 97.9 F (36.6 C)  SpO2: 95%   BP Readings from Last 3 Encounters:  06/14/24 116/74  10/14/23 120/76  10/06/23 122/68   Wt Readings from Last 3 Encounters:  06/14/24 140 lb (63.5 kg)  10/06/23 140 lb 1.9 oz (63.6 kg)  08/23/23 137 lb (62.1 kg)   Body mass index is 24.33 kg/m.    Physical Exam Constitutional:      General: She is not in acute distress.    Appearance: Normal appearance.  HENT:     Head: Normocephalic and atraumatic.  Eyes:     Conjunctiva/sclera: Conjunctivae normal.  Cardiovascular:     Rate and Rhythm: Normal rate and regular rhythm.     Heart sounds: Normal heart sounds.  Pulmonary:     Effort: Pulmonary effort is normal. No respiratory distress.     Breath sounds: Normal breath sounds. No  wheezing.  Musculoskeletal:     Cervical back: Neck supple.     Right lower leg: No edema.     Left lower leg: No edema.  Lymphadenopathy:     Cervical: No cervical adenopathy.  Skin:    General: Skin is warm and dry.     Findings: No rash.  Neurological:     Mental Status: She is alert. Mental status is at baseline.  Psychiatric:        Mood and Affect: Mood normal.        Behavior: Behavior normal.        Lab Results  Component Value Date   WBC 6.2 09/28/2023   HGB 11.6 09/28/2023   HCT 36.7 09/28/2023   PLT 244 09/28/2023   GLUCOSE 93 10/05/2023   CHOL 152 09/28/2023   TRIG 143 09/28/2023   HDL 54 09/28/2023   LDLDIRECT 171.3 10/20/2013   LDLCALC 73 09/28/2023   ALT 12 09/28/2023   AST 21 09/28/2023   NA 144 10/05/2023   K 4.4 10/05/2023   CL 107 (H) 10/05/2023   CREATININE 1.81 (H) 10/05/2023   BUN 31 (H) 10/05/2023   CO2 22 10/05/2023   TSH 1.150 09/28/2023   HGBA1C 6.0 (H) 09/28/2023     Assessment & Plan:  See Problem List for Assessment and Plan of chronic medical problems.    Follow-up annually

## 2024-06-13 NOTE — Patient Instructions (Addendum)
      Your A1c was checked today    Medications changes include :   None       Return in about 6 months (around 12/15/2024) for follow up.

## 2024-06-14 ENCOUNTER — Ambulatory Visit (INDEPENDENT_AMBULATORY_CARE_PROVIDER_SITE_OTHER): Admitting: Internal Medicine

## 2024-06-14 VITALS — BP 116/74 | HR 74 | Temp 97.9°F | Ht 63.6 in | Wt 140.0 lb

## 2024-06-14 DIAGNOSIS — E7849 Other hyperlipidemia: Secondary | ICD-10-CM | POA: Diagnosis not present

## 2024-06-14 DIAGNOSIS — N184 Chronic kidney disease, stage 4 (severe): Secondary | ICD-10-CM

## 2024-06-14 DIAGNOSIS — I10 Essential (primary) hypertension: Secondary | ICD-10-CM

## 2024-06-14 DIAGNOSIS — N078 Hereditary nephropathy, not elsewhere classified with other morphologic lesions: Secondary | ICD-10-CM

## 2024-06-14 DIAGNOSIS — R7303 Prediabetes: Secondary | ICD-10-CM | POA: Diagnosis not present

## 2024-06-14 DIAGNOSIS — N390 Urinary tract infection, site not specified: Secondary | ICD-10-CM | POA: Diagnosis not present

## 2024-06-14 HISTORY — DX: Chronic kidney disease, stage 4 (severe): N18.4

## 2024-06-14 LAB — POCT GLYCOSYLATED HEMOGLOBIN (HGB A1C)
HbA1c POC (<> result, manual entry): 5.6 % (ref 4.0–5.6)
HbA1c, POC (controlled diabetic range): 5.6 % (ref 0.0–7.0)
HbA1c, POC (prediabetic range): 5.6 % — AB (ref 5.7–6.4)
Hemoglobin A1C: 5.6 % (ref 4.0–5.6)

## 2024-06-14 MED ORDER — OLMESARTAN MEDOXOMIL 20 MG PO TABS: 20.0000 mg | ORAL_TABLET | Freq: Every day | ORAL | 3 refills | Status: AC

## 2024-06-14 MED ORDER — ESTRADIOL 0.1 MG/GM VA CREA: 1.0000 | TOPICAL_CREAM | VAGINAL | 11 refills | Status: AC

## 2024-06-14 MED ORDER — ONDANSETRON HCL 4 MG PO TABS: 4.0000 mg | ORAL_TABLET | Freq: Three times a day (TID) | ORAL | 0 refills | Status: AC | PRN

## 2024-06-14 NOTE — Assessment & Plan Note (Addendum)
 Chronic Blood pressure well-controlled Continue olmesartan  20 mg daily

## 2024-06-14 NOTE — Assessment & Plan Note (Addendum)
 Chronic Lab Results  Component Value Date   HGBA1C 6.0 (H) 09/28/2023   Check A1c today-5.6%-improved Low sugar / carb diet Continue regular exercise

## 2024-06-14 NOTE — Assessment & Plan Note (Addendum)
 Chronic Following with nephrology

## 2024-06-14 NOTE — Assessment & Plan Note (Addendum)
 Chronic Has seen urology in the past Continue Estrace  vaginal cream twice weekly and cranberry pills

## 2024-06-14 NOTE — Assessment & Plan Note (Addendum)
 Chronic Related to hereditary nephritis Following with nephrology every 6 months Will have blood work with them every 6 months

## 2024-06-14 NOTE — Assessment & Plan Note (Addendum)
 Chronic Did not tolerate crestor  or atorvastatin  due to joint pain Currently not on any medication Has a follow-up appointment with the pharmacist and will discuss other options Continue regular exercise and healthy diet

## 2024-07-07 ENCOUNTER — Ambulatory Visit: Admitting: Pharmacist

## 2024-07-17 ENCOUNTER — Ambulatory Visit: Attending: Internal Medicine | Admitting: Pharmacist

## 2024-07-17 DIAGNOSIS — E7849 Other hyperlipidemia: Secondary | ICD-10-CM | POA: Diagnosis not present

## 2024-07-17 MED ORDER — PRAVASTATIN SODIUM 10 MG PO TABS: 10.0000 mg | ORAL_TABLET | Freq: Every day | ORAL | 11 refills | Status: DC

## 2024-07-17 NOTE — Progress Notes (Signed)
 Patient ID: Sarah Valentine                 DOB: 1951-04-12                    MRN: [id number]      HPI: Sarah Valentine is a 73 y.o. female patient referred to lipid clinic by Revankar. PMH is significant for HTN, HLD, aortic atherosclerosis, CKD.  Patient with body aches on rosuvastatin . PA for pitivastatin was approved but copay was too high. Patient referred to lipid clinic. Appears baseline LDL-C is around 156. Experienced bilateral arm pain with rosuvastatin . Patient saw PharmD 02/04/24 and atorvastatin  was started. Recent complaints of joint pains while on atorvastatin . Would stop for atorvastatin  for few weeks and retry - pains would come back.  Patient presents today to lipid clinic to discuss further statin options. Patient reports that her husband is doing well (has metastatic cancer). He still has eligibility for other clinical trials for metastatic cancer treatment.  Discussed pravastatin  as an option as it is similar to rosuvastatin 's hydrophilic properties. Reviewed PCSK9i, Leqvio, and Nexletol/Nexlizet for nonstatin options. Reviewed cardiovascular reduction benefit and cost of medications.  Patient is willing to trial pravastatin  for a final statin effort. Patient has CKD so dose adjustments will be made. Interested in Keener and Nexlitol/Nexlizet in the future. She would like to try a cheaper option before trying the expensive options.  10 year ASCVD risk score 8.4% using PREVENT 10 year CVD risk 15.2%  Current Medications: None Intolerances: rosuvastatin  10mg  (body aches), atorvastatin  10mg  (joint pain) Risk Factors: CKD, HTN, age LDL-C goal: Less than 70 ApoB goal: Less than 80  Labs: Lipid Panel     Component Value Date/Time   CHOL 152 09/28/2023 0902   TRIG 143 09/28/2023 0902   HDL 54 09/28/2023 0902   CHOLHDL 2.8 09/28/2023 0902   CHOLHDL 4 06/18/2021 1022   VLDL 29.4 06/18/2021 1022   LDLCALC 73 09/28/2023 0902   LDLDIRECT 171.3 10/20/2013 0852    LABVLDL 25 09/28/2023 0902    Past Medical History:  Diagnosis Date   Acute cystitis without hematuria 08/27/2017   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   Barrett's esophagus    BCC (basal cell carcinoma), trunk 08/2013   removed from back (gould)   Cardiac murmur 12/29/2022   COVID-19 04/09/2021   Family history of breast cancer 11/25/2015   Multiple relatives  3D mammo annually  MRI of breast every other year - has been covered  Does not follow with Gyn  Will get MRI next year - 2018   Frequent UTI 08/29/2018   History of colonic polyps 09/05/2010   Qualifier: Diagnosis of   By: Ezzard CMA LEODIS), Patty      IMO SNOMED Dx Update Oct 2024     History of melanoma    Melanoma in situ (L) buttock, 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 No further follow-up is necessary.   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     Cranberry 1000 MG CAPS Take 500 mg by mouth daily.      estradiol  (ESTRACE ) 0.1 MG/GM vaginal cream Place 1 Applicatorful vaginally 2 (two) times a week. 42.5 g 11   olmesartan  (BENICAR ) 20 MG tablet Take 1 tablet (20 mg total) by mouth daily. 90 tablet 3   sodium bicarbonate 650 MG tablet Take 650 mg by mouth  2 (two) times daily.     ondansetron  (ZOFRAN ) 4 MG tablet Take 1 tablet (4 mg total) by mouth every 8 (eight) hours as needed for nausea or vomiting. 20 tablet 0   No current facility-administered medications on file prior to visit.    Allergies  Allergen Reactions   Augmentin  [Amoxicillin -Pot Clavulanate] Other (See Comments)    Abdominal pain, diarrhea   Rosuvastatin     Sulfamethoxazole-Trimethoprim Other (See Comments)   Atorvastatin  Other (See Comments)    Muscle pain, joint pain   Sulfonamide Derivatives Itching and Rash    Assessment/Plan:  1. Hyperlipidemia -  Hyperlipidemia Assessment: Last LDL-C 93 mg/dL on rosuvastatin  10 mg History of statin intolerances. Muscle aches on both rosuvastatin  10 mg and atorvastatin  10 mg Pains would resolve when stopping statin, but would come back once restarting Discussed pravastatin  option. Hydrophilic properties similar to rosuvastatin , potentially less myalgias. Reviewed PCSK9i, Leqvio, and Nexlitol/Nexlizet. Patient is interested in these at a later time given their price and cheaper options available. Patient has CKD with an estimated CrCl of 23 mL/min. Pravastatin  requires a lower starting (10 mg) with CrCl < 30 mL/min and titrate up. Plan to obtain updated lipid panel for baseline prior to starting pravastatin . Initiate pravastatin  10 mg daily  Plan: Initiate pravastatin  10 mg daily Patient plans to complete lipid labs 9/16. Contact patient when lab results Follow up in 1 month for lipid labs    Jenkins Graces, PharmD PGY1 Pharmacy Resident [phone]    Thank you,  Virlan Kempker D Zynia Wojtowicz, Pharm.D, BCACP, CPP [facility] [address] [place], [place] [address]  Phone: [phone]; Fax: [phone]

## 2024-07-17 NOTE — Patient Instructions (Signed)
 Start taking pravastatin  10mg  daily Baseline labs tomorrow and repeat labs in 1 month

## 2024-07-17 NOTE — Assessment & Plan Note (Signed)
 Assessment: Last LDL-C 93 mg/dL on rosuvastatin  10 mg History of statin intolerances. Muscle aches on both rosuvastatin  10 mg and atorvastatin  10 mg Pains would resolve when stopping statin, but would come back once restarting Discussed pravastatin  option. Hydrophilic properties similar to rosuvastatin , potentially less myalgias. Reviewed PCSK9i, Leqvio, and Nexlitol/Nexlizet. Patient is interested in these at a later time given their price and cheaper options available. Patient has CKD with an estimated CrCl of 23 mL/min. Pravastatin  requires a lower starting (10 mg) with CrCl < 30 mL/min and titrate up. Plan to obtain updated lipid panel for baseline prior to starting pravastatin . Initiate pravastatin  10 mg daily  Plan: Initiate pravastatin  10 mg daily Patient plans to complete lipid labs 9/16. Contact patient when lab results Follow up in 1 month for lipid labs

## 2024-07-20 DIAGNOSIS — N184 Chronic kidney disease, stage 4 (severe): Secondary | ICD-10-CM | POA: Diagnosis not present

## 2024-07-20 DIAGNOSIS — E7849 Other hyperlipidemia: Secondary | ICD-10-CM | POA: Diagnosis not present

## 2024-07-21 ENCOUNTER — Ambulatory Visit: Payer: Self-pay | Admitting: Pharmacist

## 2024-07-21 DIAGNOSIS — E7849 Other hyperlipidemia: Secondary | ICD-10-CM

## 2024-07-21 LAB — APOLIPOPROTEIN B: Apolipoprotein B: 106 mg/dL — ABNORMAL HIGH (ref <90)

## 2024-07-21 LAB — LIPID PANEL
Chol/HDL Ratio: 4.6 ratio — ABNORMAL HIGH (ref 0.0–4.4)
Cholesterol, Total: 219 mg/dL — ABNORMAL HIGH (ref 100–199)
HDL: 48 mg/dL (ref >39)
LDL Chol Calc (NIH): 144 mg/dL — ABNORMAL HIGH (ref 0–99)
Triglycerides: 151 mg/dL — ABNORMAL HIGH (ref 0–149)
VLDL Cholesterol Cal: 27 mg/dL (ref 5–40)

## 2024-08-07 DIAGNOSIS — N184 Chronic kidney disease, stage 4 (severe): Secondary | ICD-10-CM | POA: Diagnosis not present

## 2024-08-07 DIAGNOSIS — D631 Anemia in chronic kidney disease: Secondary | ICD-10-CM | POA: Diagnosis not present

## 2024-08-07 DIAGNOSIS — E559 Vitamin D deficiency, unspecified: Secondary | ICD-10-CM | POA: Diagnosis not present

## 2024-08-07 DIAGNOSIS — E872 Acidosis, unspecified: Secondary | ICD-10-CM | POA: Diagnosis not present

## 2024-08-07 DIAGNOSIS — N078 Hereditary nephropathy, not elsewhere classified with other morphologic lesions: Secondary | ICD-10-CM | POA: Diagnosis not present

## 2024-08-07 DIAGNOSIS — N189 Chronic kidney disease, unspecified: Secondary | ICD-10-CM | POA: Diagnosis not present

## 2024-08-07 DIAGNOSIS — N2581 Secondary hyperparathyroidism of renal origin: Secondary | ICD-10-CM | POA: Diagnosis not present

## 2024-08-07 DIAGNOSIS — I129 Hypertensive chronic kidney disease with stage 1 through stage 4 chronic kidney disease, or unspecified chronic kidney disease: Secondary | ICD-10-CM | POA: Diagnosis not present

## 2024-08-07 DIAGNOSIS — I151 Hypertension secondary to other renal disorders: Secondary | ICD-10-CM | POA: Diagnosis not present

## 2024-09-08 ENCOUNTER — Encounter: Payer: Self-pay | Admitting: Pharmacist

## 2024-09-12 ENCOUNTER — Ambulatory Visit (INDEPENDENT_AMBULATORY_CARE_PROVIDER_SITE_OTHER)

## 2024-09-12 VITALS — BP 120/80 | HR 72 | Ht 63.0 in | Wt 142.2 lb

## 2024-09-12 DIAGNOSIS — Z780 Asymptomatic menopausal state: Secondary | ICD-10-CM

## 2024-09-12 DIAGNOSIS — Z0000 Encounter for general adult medical examination without abnormal findings: Secondary | ICD-10-CM

## 2024-09-12 NOTE — Patient Instructions (Addendum)
 Ms. Marano,  Thank you for taking the time for your Medicare Wellness Visit. I appreciate your continued commitment to your health goals. Please review the care plan we discussed, and feel free to reach out if I can assist you further.  Please note that Annual Wellness Visits do not include a physical exam. Some assessments may be limited, especially if the visit was conducted virtually. If needed, we may recommend an in-person follow-up with your provider.  Ongoing Care Seeing your primary care provider every 3 to 6 months helps us  monitor your health and provide consistent, personalized care.   Referrals If a referral was made during today's visit and you haven't received any updates within two weeks, please contact the referred provider directly to check on the status.  Recommended Screenings:  Health Maintenance  Topic Date Due   COVID-19 Vaccine (3 - Pfizer risk series) 01/16/2020   Flu Shot  06/02/2024   DEXA scan (bone density measurement)  06/22/2024   Breast Cancer Screening  08/13/2025   Medicare Annual Wellness Visit  09/12/2025   DTaP/Tdap/Td vaccine (3 - Td or Tdap) 12/04/2025   Colon Cancer Screening  10/03/2026   Pneumococcal Vaccine for age over 36  Completed   Hepatitis C Screening  Completed   Zoster (Shingles) Vaccine  Completed   Meningitis B Vaccine  Aged Out       09/12/2024   10:17 AM  Advanced Directives  Does Patient Have a Medical Advance Directive? Yes  Type of Estate Agent of [place];Living will  Does patient want to make changes to medical advance directive? Yes (Inpatient - patient requests chaplain consult to change a medical advance directive)  Copy of Healthcare Power of Attorney in Chart? No - copy requested    Vision: Annual vision screenings are recommended for early detection of glaucoma, cataracts, and diabetic retinopathy. These exams can also reveal signs of chronic conditions such as diabetes and high blood  pressure.  Dental: Annual dental screenings help detect early signs of oral cancer, gum disease, and other conditions linked to overall health, including heart disease and diabetes.

## 2024-09-12 NOTE — Progress Notes (Signed)
 Subjective:   Sarah Valentine is a 73 y.o. female who presents for a Medicare Annual Wellness Visit.  Allergies (verified) Augmentin  [amoxicillin -pot clavulanate], Rosuvastatin , Sulfamethoxazole-trimethoprim, Atorvastatin , and Sulfonamide derivatives   History: Past Medical History:  Diagnosis Date   Acute cystitis without hematuria 08/27/2017   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   Barrett's esophagus    BCC (basal cell carcinoma), trunk 08/2013   removed from back (gould)   Cardiac murmur 12/29/2022   COVID-19 04/09/2021   Family history of breast cancer 11/25/2015   Multiple relatives  3D mammo annually  MRI of breast every other year - has been covered  Does not follow with Gyn  Will get MRI next year - 2018   Frequent UTI 08/29/2018   History of colonic polyps 09/05/2010   Qualifier: Diagnosis of   By: Ezzard CMA LEODIS), Patty      IMO SNOMED Dx Update Oct 2024     History of melanoma    Melanoma in situ (L) buttock, 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 No further follow-up is necessary.   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   excised from back [facility])   Past Surgical History:  Procedure Laterality Date   ABDOMINAL HYSTERECTOMY  11/03/1991   BACK SURGERY  2020   and in 2021 due to herniated disc   BASAL CELL CARCINOMA EXCISION  08/02/2013   back   CHOLECYSTECTOMY  11/02/2008   COLONOSCOPY  2019    DJ-MAC-suprep(good)-tics/polyps x 5 (TA/SSP)   COLONOSCOPY W/ BIOPSIES AND POLYPECTOMY  12/03/2008   polyp removed   MELANOMA EXCISION Left 11/02/2009   buttock   Perinephtric abscess with peritonitis  11/02/1978   POLYPECTOMY  2019   TA and SSP   SQUAMOUS CELL CARCINOMA EXCISION  01/31/2014   back   Family History  Problem Relation Age of Onset   Breast cancer Mother 26   Arthritis Mother    Hyperlipidemia Mother    Heart disease Mother    Hypertension Mother    Kidney disease Mother    Diabetes Mother    Arthritis Father    Esophageal cancer Father 77   Congestive Heart Failure Sister    Breast cancer Maternal Aunt        69s   Breast cancer Maternal Aunt        70s   Esophageal cancer Paternal Uncle 54   Colon cancer Maternal Grandmother 78   Colon polyps Maternal Grandmother 78   Breast cancer Maternal Grandmother 72   Arthritis Maternal Grandmother    Hypertension Maternal Grandmother    Kidney disease Maternal Grandmother    Diabetes Maternal Grandmother    Breast cancer Cousin 43   Breast cancer Cousin        67s   Arthritis Other    Kidney disease Other  [place] nephritis -pt states mostly maternal side of family as the disease   Rectal cancer Neg Hx    Stomach cancer Neg Hx    Pancreatic cancer Neg Hx    Social History   Occupational History   Occupation: Academic Librarian: [employer]   Occupation: Retired  Tobacco Use   Smoking status: Never   Smokeless tobacco: Never  Vaping Use   Vaping status: Never Used  Substance and Sexual Activity   Alcohol use: No    Alcohol/week: 0.0 standard drinks of alcohol   Drug use: No   Sexual activity: Yes   Tobacco Counseling Counseling given: Not Answered  SDOH Screenings   Food Insecurity: No Food Insecurity (09/12/2024)  Housing: Low Risk  (09/12/2024)  Transportation Needs: No Transportation Needs (09/12/2024)  Utilities: Not At Risk (09/12/2024)  Alcohol Screen: Low Risk   (08/23/2023)  Depression (PHQ2-9): Low Risk  (09/12/2024)  Financial Resource Strain: Low Risk  (09/11/2024)  Physical Activity: Sufficiently Active (09/12/2024)  Social Connections: Moderately Integrated (09/12/2024)  Stress: No Stress Concern Present (09/12/2024)  Tobacco Use: Low Risk  (09/12/2024)  Health Literacy: Adequate Health Literacy (09/12/2024)   Depression Screen    09/12/2024   10:19 AM 08/23/2023    9:07 AM 02/10/2023    9:09 AM 07/13/2022    9:16 AM 12/15/2021    1:31 PM 06/27/2021   10:05 AM 04/04/2020   10:02 AM  PHQ 2/9 Scores  PHQ - 2 Score 0 0 0 0 0 0 0  PHQ- 9 Score 0 0  0          Data saved with a previous flowsheet row definition     Goals Addressed               This Visit's Progress     Patient Stated (pt-stated)        Patient stated she plans to continue walking 4-55miles every morning        Visit info / Clinical Intake: Medicare Wellness Visit Type:: Subsequent Annual Wellness Visit Persons participating in visit:: patient Medicare Wellness Visit Mode:: In-person (required for WTM) Information given by:: patient Interpreter Needed?: No Pre-visit prep was completed: yes AWV questionnaire completed by patient prior to visit?: yes Date:: 09/11/24 Living arrangements:: lives with spouse/significant other Patient's Overall Health Status Rating: very good Typical amount of pain: none Does pain affect daily life?: no Are you currently prescribed opioids?: no  Dietary Habits and Nutritional Risks How many meals a day?: 3 Eats fruit and vegetables daily?: yes Most meals are obtained by: preparing own meals In the last 2 weeks, have you had any of the following?: none Diabetic:: no  Functional Status Activities of Daily Living (to include ambulation/medication): Independent Ambulation: Independent with device- listed below Home Assistive Devices/Equipment: Eyeglasses Medication Administration: Independent Home Management:  Independent Manage your own finances?: yes Primary transportation is: driving Concerns about vision?: no *vision screening is required for WTM* Concerns about hearing?: no  Fall Screening Falls in the past year?: 0 Number of falls in past year: 0 Was there an injury with Fall?: 0 Fall Risk Category Calculator: 0 Patient Fall Risk Level: Low Fall Risk  Fall Risk Patient at Risk for Falls Due to: No Fall Risks Fall risk Follow up: Falls evaluation completed; Falls prevention discussed  Home and Transportation Safety: All rugs have non-skid backing?: yes All stairs or steps have railings?: yes Grab bars in the bathtub or shower?: (!) no Have  non-skid surface in bathtub or shower?: yes Good home lighting?: yes Regular seat belt use?: yes Hospital stays in the last year:: no  Cognitive Assessment Difficulty concentrating, remembering, or making decisions? : no Will 6CIT or Mini Cog be Completed: yes What year is it?: 0 points What month is it?: 0 points Give patient an address phrase to remember (5 components): [address] [place], [place] About what time is it?: 0 points Count backwards from 20 to 1: 0 points Say the months of the year in reverse: 0 points Repeat the address phrase from earlier: 0 points 6 CIT Score: 0 points  Advance Directives (For Healthcare) Does Patient Have a Medical Advance Directive?: Yes Does patient want to make changes to medical advance directive?: Yes (Inpatient - patient requests chaplain consult to change a medical advance directive) Type of Advance Directive: Healthcare Power of [place]; Living will Copy of Healthcare Power of Attorney in Chart?: No - copy requested Copy of Living Will in Chart?: No - copy requested  Reviewed/Updated  Reviewed/Updated: Reviewed All (Medical, Surgical, Family, Medications, Allergies, Care Teams, Patient Goals)        Objective:    Today's Vitals   09/12/24 1014  BP: 120/80  Pulse: 72  SpO2: 98%   Weight: 142 lb 3.2 oz (64.5 kg)  Height: 5' 3 (1.6 m)   Body mass index is 25.19 kg/m.  Current Medications (verified) Outpatient Encounter Medications as of 09/12/2024  Medication Sig   Cholecalciferol (VITAMIN D3) 50 MCG (2000 UT) capsule Take 2,000 Units by mouth daily.   Cranberry 1000 MG CAPS Take 500 mg by mouth daily.    estradiol  (ESTRACE ) 0.1 MG/GM vaginal cream Place 1 Applicatorful vaginally 2 (two) times a week.   olmesartan  (BENICAR ) 20 MG tablet Take 1 tablet (20 mg total) by mouth daily.   ondansetron  (ZOFRAN ) 4 MG tablet Take 1 tablet (4 mg total) by mouth every 8 (eight) hours as needed for nausea or vomiting.   pravastatin  (PRAVACHOL ) 10 MG tablet Take 1 tablet (10 mg total) by mouth daily.   sodium bicarbonate 650 MG tablet Take 650 mg by mouth 2 (two) times daily.   No facility-administered encounter medications on file as of 09/12/2024.   Hearing/Vision screen Hearing Screening - Comments:: Denies hearing difficulties   Vision Screening - Comments:: Wears rx glasses - up to date with routine eye exams with  the [facility] Immunizations and Health Maintenance Health Maintenance  Topic Date Due   COVID-19 Vaccine (3 - Pfizer risk series) 01/16/2020   Influenza Vaccine  06/02/2024   DEXA SCAN  06/22/2024   Mammogram  08/13/2025   Medicare Annual Wellness (AWV)  09/12/2025   DTaP/Tdap/Td (3 - Td or Tdap) 12/04/2025   Colonoscopy  10/03/2026   Pneumococcal Vaccine: 50+ Years  Completed   Hepatitis C Screening  Completed   Zoster Vaccines- Shingrix  Completed   Meningococcal B Vaccine  Aged Out        Assessment/Plan:  This is a routine wellness examination for [place].  Patient Care Team: Geofm Glade PARAS, MD as PCP - General (Internal Medicine) Teressa Toribio SQUIBB, MD (Inactive) (Gastroenterology) Mable Lenis, MD (Inactive) (Otolaryngology) Leora Lenis, MD (Plastic Surgery) Robinson Pao, MD (Dermatology) Merrilyn Handler, MD (Inactive)  (General Surgery) Dolan Mateo Larger, MD as Consulting Physician (Nephrology)  I have personally reviewed and noted the following in the patient's chart:   Medical and social history Use of alcohol, tobacco or illicit drugs  Current medications and  supplements including opioid prescriptions. Functional ability and status Nutritional status Physical activity Advanced directives List of other physicians Hospitalizations, surgeries, and ER visits in previous 12 months Vitals Screenings to include cognitive, depression, and falls Referrals and appointments  Orders Placed This Encounter  Procedures   DG Bone Density    Standing Status:   Future    Expiration Date:   09/12/2025    Reason for Exam (SYMPTOM  OR DIAGNOSIS REQUIRED):   post menopausal estrogen deficient    Preferred imaging location?:   [address]   In addition, I have reviewed and discussed with patient certain preventive protocols, quality metrics, and best practice recommendations. A written personalized care plan for preventive services as well as general preventive health recommendations were provided to patient.   Verdie CHRISTELLA Saba, CMA   09/12/2024   Return in 1 year (on 09/12/2025).  After Visit Summary: (In Person-Declined) Patient declined AVS at this time.  Nurse Notes: Ordered DEXA Scan; Scheduled 2026 AWV/CPE appts.

## 2024-09-13 ENCOUNTER — Ambulatory Visit
Admission: RE | Admit: 2024-09-13 | Discharge: 2024-09-13 | Disposition: A | Discharge status: Home / Self Care | Source: Ambulatory Visit | Attending: Internal Medicine | Admitting: Internal Medicine

## 2024-09-13 ENCOUNTER — Other Ambulatory Visit: Payer: Self-pay | Admitting: Internal Medicine

## 2024-09-13 DIAGNOSIS — N6001 Solitary cyst of right breast: Secondary | ICD-10-CM

## 2024-09-13 DIAGNOSIS — N6312 Unspecified lump in the right breast, upper inner quadrant: Secondary | ICD-10-CM | POA: Diagnosis not present

## 2024-09-13 DIAGNOSIS — R928 Other abnormal and inconclusive findings on diagnostic imaging of breast: Secondary | ICD-10-CM | POA: Diagnosis not present

## 2024-09-22 DIAGNOSIS — E7849 Other hyperlipidemia: Secondary | ICD-10-CM | POA: Diagnosis not present

## 2024-09-23 LAB — HEPATIC FUNCTION PANEL
ALT: 16 IU/L (ref 0–32)
AST: 22 IU/L (ref 0–40)
Albumin: 4.1 g/dL (ref 3.8–4.8)
Alkaline Phosphatase: 136 IU/L — ABNORMAL HIGH (ref 49–135)
Bilirubin Total: 0.3 mg/dL (ref 0.0–1.2)
Bilirubin, Direct: 0.11 mg/dL (ref 0.00–0.40)
Total Protein: 6.2 g/dL (ref 6.0–8.5)

## 2024-09-23 LAB — LIPID PANEL
Chol/HDL Ratio: 4.1 ratio (ref 0.0–4.4)
Cholesterol, Total: 193 mg/dL (ref 100–199)
HDL: 47 mg/dL (ref >39)
LDL Chol Calc (NIH): 106 mg/dL — ABNORMAL HIGH (ref 0–99)
Triglycerides: 230 mg/dL — ABNORMAL HIGH (ref 0–149)
VLDL Cholesterol Cal: 40 mg/dL (ref 5–40)

## 2024-09-23 LAB — APOLIPOPROTEIN B: Apolipoprotein B: 92 mg/dL — AB (ref <90)

## 2024-09-26 MED ORDER — PRAVASTATIN SODIUM 20 MG PO TABS: 20.0000 mg | ORAL_TABLET | Freq: Every evening | ORAL | 3 refills | Status: DC

## 2024-09-26 NOTE — Addendum Note (Signed)
 Addended by: Roberta Angell D on: 09/26/2024 09:45 AM   Modules accepted: Orders

## 2024-10-11 ENCOUNTER — Ambulatory Visit: Attending: Cardiology | Admitting: Cardiology

## 2024-10-11 ENCOUNTER — Encounter: Payer: Self-pay | Admitting: Cardiology

## 2024-10-11 VITALS — BP 90/58 | HR 89 | Ht 63.6 in | Wt 139.0 lb

## 2024-10-11 DIAGNOSIS — I10 Essential (primary) hypertension: Secondary | ICD-10-CM | POA: Diagnosis present

## 2024-10-11 DIAGNOSIS — I700 Atherosclerosis of aorta: Secondary | ICD-10-CM | POA: Insufficient documentation

## 2024-10-11 DIAGNOSIS — N184 Chronic kidney disease, stage 4 (severe): Secondary | ICD-10-CM | POA: Diagnosis present

## 2024-10-11 DIAGNOSIS — N289 Disorder of kidney and ureter, unspecified: Secondary | ICD-10-CM | POA: Insufficient documentation

## 2024-10-11 DIAGNOSIS — E782 Mixed hyperlipidemia: Secondary | ICD-10-CM | POA: Diagnosis present

## 2024-10-11 MED ORDER — ROSUVASTATIN CALCIUM 10 MG PO TABS: 10.0000 mg | ORAL_TABLET | Freq: Every day | ORAL | 3 refills | Status: AC

## 2024-10-11 NOTE — Progress Notes (Signed)
 Cardiology Office Note:    Date:  10/11/2024   ID:  Sarah Valentine, DOB Mar 26, 1951, MRN [id number]  PCP:  Geofm Glade PARAS, MD  Cardiologist:  Jennifer JONELLE Crape, MD   Referring MD: Geofm Glade PARAS, MD    ASSESSMENT:    1. Mixed hyperlipidemia   2. Aortic atherosclerosis   3. Primary hypertension   4. CKD (chronic kidney disease) stage 4, GFR 15-29 ml/min (HCC)   5. Renal insufficiency    PLAN:    In order of problems listed above:  Aortic atherosclerosis:Primary prevention stressed with the patient.  Importance of compliance with diet medication stressed and patient verbalized standing. She was advised to walk at least half an hour on a daily basis. Diabetes mellitus: Managed by primary care.  She is on ARB for calcium  present.  Her blood pressures is borderline Mixed dyslipidemia: On lipid-lowering medications followed by primary.  We will change to her medication rosuvastatin  10 mg.  She has used this in the past.  she will be back in 6 weeks for Chem-7 liver lipid check Renal insufficiency: Stable from past evaluation.  She is on ARB for this reason. Patient will be seen in follow-up appointment in 6 months or earlier if the patient has any concerns.    Medication Adjustments/Labs and Tests Ordered: Current medicines are reviewed at length with the patient today.  Concerns regarding medicines are outlined above.  Orders Placed This Encounter  Procedures   EKG 12-Lead   No orders of the defined types were placed in this encounter.    No chief complaint on file.    History of Present Illness:    Sarah Valentine is a 73 y.o. female.  Patient has past medical history of aortic atherosclerosis, mixed dyslipidemia, diabetes mellitus and renal insufficiency.  She remains on medications and is an active lady.  She denies any chest pain orthopnea or PND.  At the time of my evaluation, the patient is alert awake oriented and in no distress.  Past Medical History:   Diagnosis Date   Acute cystitis without hematuria 08/27/2017   Aortic atherosclerosis 10/06/2023   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   Barrett's esophagus    BCC (basal cell carcinoma), trunk 08/2013   removed from back ([facility])   Cardiac murmur 12/29/2022   CKD (chronic kidney disease) stage 4, GFR 15-29 ml/min (HCC) 06/14/2024   COVID-19 04/09/2021   Family history of breast cancer 11/25/2015   Multiple relatives  3D mammo annually  MRI of breast every other year - has been covered  Does not follow with Gyn  Will get MRI next year - 2018   Frequent UTI 08/29/2018   History of colonic polyps 09/05/2010   Qualifier: Diagnosis of   By: Ezzard CMA LEODIS), Patty      IMO SNOMED Dx Update Oct 2024     History of melanoma    Melanoma in situ (L) buttock, 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 No further follow-up is necessary.   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   excised from back [facility])    Past Surgical History:  Procedure Laterality Date   ABDOMINAL HYSTERECTOMY  11/03/1991   BACK SURGERY  2020   and in 2021 due to herniated disc   BASAL CELL CARCINOMA EXCISION  08/02/2013   back   CHOLECYSTECTOMY  11/02/2008   COLONOSCOPY  2019   DJ-MAC-suprep(good)-tics/polyps x 5 (TA/SSP)   COLONOSCOPY W/ BIOPSIES AND POLYPECTOMY  12/03/2008   polyp removed   MELANOMA EXCISION Left 11/02/2009   buttock   Perinephtric abscess with peritonitis  11/02/1978   POLYPECTOMY  2019   TA and SSP    SQUAMOUS CELL CARCINOMA EXCISION  01/31/2014   back    Current Medications: Current Meds  Medication Sig   Cholecalciferol (VITAMIN D3) 50 MCG (2000 UT) capsule Take 2,000 Units by mouth daily.   Cranberry 1000 MG CAPS Take 500 mg by mouth 2 (two) times daily.   estradiol  (ESTRACE ) 0.1 MG/GM vaginal cream Place 1 Applicatorful vaginally 2 (two) times a week.   olmesartan  (BENICAR ) 20 MG tablet Take 1 tablet (20 mg total) by mouth daily.   ondansetron  (ZOFRAN ) 4 MG tablet Take 1 tablet (4 mg total) by mouth every 8 (eight) hours as needed for nausea or vomiting.   pravastatin  (PRAVACHOL ) 20 MG tablet Take 1 tablet (20 mg total) by mouth every evening.   sodium bicarbonate 650 MG tablet Take 650 mg by mouth 2 (two) times daily.     Allergies:   Augmentin  [amoxicillin -pot clavulanate], Rosuvastatin , Sulfamethoxazole-trimethoprim, Atorvastatin , and Sulfonamide derivatives   Social History   Socioeconomic History   Marital status: Married    Spouse name: Alm   Number of children: 3   Years of education: Not on file   Highest education level: Associate degree: academic program  Occupational History   Occupation: Academic Librarian: [employer]   Occupation: Retired  Tobacco Use   Smoking status: Never   Smokeless tobacco: Never  Vaping Use   Vaping status: Never Used  Substance and Sexual Activity   Alcohol use: No    Alcohol/week: 0.0 standard drinks of alcohol   Drug use: No   Sexual activity: Yes  Other Topics Concern   Not on file  Social History Narrative   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[place]  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   Married, lives with spouse - 3 daughters all in [place] area   [place] to [place] from [place]  in 2010   Social Drivers of Health   Financial Resource Strain: Low Risk  (09/11/2024)   Overall Financial Resource Strain (CARDIA)    Difficulty of Paying Living Expenses: Not hard  at all  Food Insecurity: No Food Insecurity (09/12/2024)   Hunger Vital Sign    Worried About Running Out of Food in the Last Year: Never true    Ran Out of Food in the Last Year: Never true  Transportation Needs: No Transportation Needs (09/12/2024)   PRAPARE - Administrator, Civil Service (Medical): No    Lack of Transportation (Non-Medical): No  Physical Activity: Sufficiently Active (09/12/2024)   Exercise Vital Sign    Days of Exercise per Week: 6 days    Minutes of Exercise per Session: 70 min  Stress: No Stress Concern Present (09/12/2024)   [employer] of Occupational Health -  Occupational Stress Questionnaire    Feeling of Stress: Only a little  Social Connections: Moderately Integrated (09/12/2024)   Social Connection and Isolation Panel    Frequency of Communication with Friends and Family: More than three times a week    Frequency of Social Gatherings with Friends and Family: Three times a week    Attends Religious Services: 1 to 4 times per year    Active Member of Clubs or Organizations: No    Attends Engineer, Structural: Never    Marital Status: Married     Family History: The patient's family history includes Arthritis in her father, maternal grandmother, mother, and another family member; Breast cancer in her cousin, maternal aunt, and maternal aunt; Breast cancer (age of onset: 68) in her cousin; Breast cancer (age of onset: 63) in her mother; Breast cancer (age of onset: 61) in her maternal grandmother; Colon cancer (age of onset: 77) in her maternal grandmother; Colon polyps (age of onset: 24) in her maternal grandmother; Congestive Heart Failure in her sister; Diabetes in her maternal grandmother and mother; Esophageal cancer (age of onset: 32) in her father; Esophageal cancer (age of onset: 12) in her paternal uncle; Heart disease in her mother; Hyperlipidemia in her mother; Hypertension in her maternal grandmother and mother; Kidney  disease in her maternal grandmother, mother, and another family member. There is no history of Rectal cancer, Stomach cancer, or Pancreatic cancer.  ROS:   Please see the history of present illness.    All other systems reviewed and are negative.  EKGs/Labs/Other Studies Reviewed:    The following studies were reviewed today: .SABRAEKG Interpretation Date/Time:  Wednesday October 11 2024 09:05:55 EST Ventricular Rate:  89 PR Interval:  144 QRS Duration:  92 QT Interval:  380 QTC Calculation: 462 R Axis:   -38  Text Interpretation: Normal sinus rhythm Left axis deviation Incomplete right bundle branch block When compared with ECG of 22-Nov-2022 03:40, PREVIOUS ECG IS PRESENT Confirmed by Edwyna Backers [id number]) on 10/11/2024 9:11:00 AM     Recent Labs: 09/22/2024: ALT 16  Recent Lipid Panel    Component Value Date/Time   CHOL 193 09/22/2024 0902   TRIG 230 (H) 09/22/2024 0902   HDL 47 09/22/2024 0902   CHOLHDL 4.1 09/22/2024 0902   CHOLHDL 4 06/18/2021 1022   VLDL 29.4 06/18/2021 1022   LDLCALC 106 (H) 09/22/2024 0902   LDLDIRECT 171.3 10/20/2013 0852    Physical Exam:    VS:  BP (!) 90/58   Pulse 89   Ht 5' 3.6 (1.615 m)   Wt 139 lb (63 kg)   SpO2 95%   BMI 24.16 kg/m     Wt Readings from Last 3 Encounters:  10/11/24 139 lb (63 kg)  09/12/24 142 lb 3.2 oz (64.5 kg)  06/14/24 140 lb (63.5 kg)     GEN: Patient is in no acute distress HEENT: Normal NECK: No JVD; No carotid bruits LYMPHATICS: No lymphadenopathy CARDIAC: Hear sounds regular, 2/6 systolic murmur at the apex. RESPIRATORY:  Clear to auscultation without rales, wheezing or rhonchi  ABDOMEN: Soft, non-tender, non-distended MUSCULOSKELETAL:  No edema; No deformity  SKIN: Warm and dry NEUROLOGIC:  Alert and oriented x 3 PSYCHIATRIC:  Normal affect   Signed, Backers JONELLE Edwyna, MD  10/11/2024 9:13 AM    [facility]

## 2024-10-11 NOTE — Patient Instructions (Signed)
 Medication Instructions:  Your physician has recommended you make the following change in your medication:   Stop Pravastatin   Start Crestor  10 mg daily  Start CoQ10 200 mg daily (this is over the counter in the supplement section)  *If you need a refill on your cardiac medications before your next appointment, please call your pharmacy*   Lab Work: Your physician recommends that you return for lab work in: 6 weeks for CMP and lipids. You need to have labs done when you are fasting. [facility] lab is located on the 3rd floor, [address]. Hours are Monday - Thursday 8 am to 4 pm, closed 11:30 am to 1:00 pm. You do NOT need an appointment.   If you have labs (blood work) drawn today and your tests are completely normal, you will receive your results only by: MyChart Message (if you have MyChart) OR A paper copy in the mail If you have any lab test that is abnormal or we need to change your treatment, we will call you to review the results.   Testing/Procedures: None ordered   Follow-Up: At [facility], you and your health needs are our priority.  As part of our continuing mission to provide you with exceptional heart care, we have created designated Provider Care Teams.  These Care Teams include your primary Cardiologist (physician) and Advanced Practice Providers (APPs -  Physician Assistants and Nurse Practitioners) who all work together to provide you with the care you need, when you need it.  We recommend signing up for the patient portal called MyChart.  Sign up information is provided on this After Visit Summary.  MyChart is used to connect with patients for Virtual Visits (Telemedicine).  Patients are able to view lab/test results, encounter notes, upcoming appointments, etc.  Non-urgent messages can be sent to your provider as well.   To learn more about what you can do with MyChart, go to [email or web address].    Your next appointment:   9 month(s)  The format  for your next appointment:   In Person  Provider:   Jennifer Crape, MD    Other Instructions none  Important Information About Sugar

## 2024-11-07 ENCOUNTER — Ambulatory Visit (HOSPITAL_BASED_OUTPATIENT_CLINIC_OR_DEPARTMENT_OTHER)
Admission: RE | Admit: 2024-11-07 | Discharge: 2024-11-07 | Disposition: A | Discharge status: Home / Self Care | Source: Ambulatory Visit | Attending: Internal Medicine | Admitting: Internal Medicine

## 2024-11-07 DIAGNOSIS — Z780 Asymptomatic menopausal state: Secondary | ICD-10-CM | POA: Diagnosis present

## 2024-11-11 ENCOUNTER — Ambulatory Visit: Payer: Self-pay | Admitting: Internal Medicine

## 2024-11-11 DIAGNOSIS — M8589 Other specified disorders of bone density and structure, multiple sites: Secondary | ICD-10-CM

## 2024-11-11 DIAGNOSIS — M8580 Other specified disorders of bone density and structure, unspecified site: Secondary | ICD-10-CM | POA: Insufficient documentation

## 2024-12-19 ENCOUNTER — Ambulatory Visit: Admitting: Internal Medicine

## 2025-03-13 ENCOUNTER — Other Ambulatory Visit

## 2025-09-18 ENCOUNTER — Encounter: Admitting: Internal Medicine

## 2025-09-18 ENCOUNTER — Ambulatory Visit
